# Patient Record
Sex: Female | Born: 1970 | Race: White | Hispanic: No | Marital: Married | Smoking: Never smoker
Health system: Southern US, Community
[De-identification: ages and names within clinical notes are randomized; demographics above are authoritative.]

## PROBLEM LIST (undated history)

## (undated) DIAGNOSIS — F329 Major depressive disorder, single episode, unspecified: Secondary | ICD-10-CM

## (undated) DIAGNOSIS — F419 Anxiety disorder, unspecified: Secondary | ICD-10-CM

## (undated) DIAGNOSIS — D229 Melanocytic nevi, unspecified: Secondary | ICD-10-CM

## (undated) DIAGNOSIS — F32A Depression, unspecified: Secondary | ICD-10-CM

## (undated) DIAGNOSIS — Z87442 Personal history of urinary calculi: Secondary | ICD-10-CM

## (undated) DIAGNOSIS — E785 Hyperlipidemia, unspecified: Secondary | ICD-10-CM

## (undated) HISTORY — DX: Hyperlipidemia, unspecified: E78.5

## (undated) HISTORY — DX: Major depressive disorder, single episode, unspecified: F32.9

## (undated) HISTORY — DX: Depression, unspecified: F32.A

## (undated) HISTORY — PX: APPENDECTOMY: SHX54

## (undated) HISTORY — PX: TUBAL LIGATION: SHX77

---
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
## 1997-09-10 ENCOUNTER — Ambulatory Visit (HOSPITAL_COMMUNITY): Admission: RE | Admit: 1997-09-10 | Discharge: 1997-09-10 | Payer: Self-pay | Admitting: Obstetrics and Gynecology

## 1997-12-05 ENCOUNTER — Inpatient Hospital Stay (HOSPITAL_COMMUNITY): Admission: AD | Admit: 1997-12-05 | Discharge: 1997-12-07 | Payer: Self-pay | Admitting: Obstetrics and Gynecology

## 1999-02-19 ENCOUNTER — Other Ambulatory Visit: Admission: RE | Admit: 1999-02-19 | Discharge: 1999-02-19 | Payer: Self-pay | Admitting: Obstetrics and Gynecology

## 2000-02-24 HISTORY — PX: COLONOSCOPY: SHX174

## 2000-04-06 ENCOUNTER — Other Ambulatory Visit: Admission: RE | Admit: 2000-04-06 | Discharge: 2000-04-06 | Payer: Self-pay | Admitting: Obstetrics and Gynecology

## 2000-10-02 ENCOUNTER — Encounter: Payer: Self-pay | Admitting: Emergency Medicine

## 2000-10-02 ENCOUNTER — Inpatient Hospital Stay (HOSPITAL_COMMUNITY): Admission: EM | Admit: 2000-10-02 | Discharge: 2000-10-04 | Payer: Self-pay | Admitting: Emergency Medicine

## 2000-10-02 ENCOUNTER — Encounter (INDEPENDENT_AMBULATORY_CARE_PROVIDER_SITE_OTHER): Payer: Self-pay | Admitting: Specialist

## 2000-11-10 ENCOUNTER — Emergency Department (HOSPITAL_COMMUNITY): Admission: EM | Admit: 2000-11-10 | Discharge: 2000-11-10 | Payer: Self-pay | Admitting: Emergency Medicine

## 2000-11-13 ENCOUNTER — Emergency Department (HOSPITAL_COMMUNITY): Admission: EM | Admit: 2000-11-13 | Discharge: 2000-11-13 | Payer: Self-pay

## 2000-11-19 ENCOUNTER — Encounter: Admission: RE | Admit: 2000-11-19 | Discharge: 2000-11-19 | Payer: Self-pay | Admitting: Gastroenterology

## 2000-11-19 ENCOUNTER — Encounter: Payer: Self-pay | Admitting: Gastroenterology

## 2000-12-02 ENCOUNTER — Encounter: Payer: Self-pay | Admitting: Obstetrics and Gynecology

## 2000-12-02 ENCOUNTER — Ambulatory Visit (HOSPITAL_COMMUNITY): Admission: RE | Admit: 2000-12-02 | Discharge: 2000-12-02 | Payer: Self-pay | Admitting: Obstetrics and Gynecology

## 2000-12-03 ENCOUNTER — Encounter: Admission: RE | Admit: 2000-12-03 | Discharge: 2000-12-03 | Payer: Self-pay | Admitting: General Surgery

## 2000-12-03 ENCOUNTER — Encounter: Payer: Self-pay | Admitting: General Surgery

## 2001-04-19 ENCOUNTER — Other Ambulatory Visit: Admission: RE | Admit: 2001-04-19 | Discharge: 2001-04-19 | Payer: Self-pay | Admitting: Obstetrics and Gynecology

## 2001-08-03 ENCOUNTER — Encounter: Payer: Self-pay | Admitting: Emergency Medicine

## 2001-08-03 ENCOUNTER — Emergency Department (HOSPITAL_COMMUNITY): Admission: EM | Admit: 2001-08-03 | Discharge: 2001-08-03 | Payer: Self-pay | Admitting: Emergency Medicine

## 2002-05-08 ENCOUNTER — Other Ambulatory Visit: Admission: RE | Admit: 2002-05-08 | Discharge: 2002-05-08 | Payer: Self-pay | Admitting: Obstetrics and Gynecology

## 2002-11-24 ENCOUNTER — Encounter: Payer: Self-pay | Admitting: Gastroenterology

## 2002-11-24 ENCOUNTER — Ambulatory Visit (HOSPITAL_COMMUNITY): Admission: RE | Admit: 2002-11-24 | Discharge: 2002-11-24 | Payer: Self-pay | Admitting: Gastroenterology

## 2003-05-21 ENCOUNTER — Other Ambulatory Visit: Admission: RE | Admit: 2003-05-21 | Discharge: 2003-05-21 | Payer: Self-pay | Admitting: Obstetrics and Gynecology

## 2003-10-05 ENCOUNTER — Encounter: Admission: RE | Admit: 2003-10-05 | Discharge: 2003-10-05 | Payer: Self-pay | Admitting: Internal Medicine

## 2003-10-22 ENCOUNTER — Encounter: Admission: RE | Admit: 2003-10-22 | Discharge: 2003-10-22 | Payer: Self-pay | Admitting: Internal Medicine

## 2004-02-20 ENCOUNTER — Ambulatory Visit: Payer: Self-pay | Admitting: Internal Medicine

## 2004-02-21 ENCOUNTER — Ambulatory Visit: Payer: Self-pay | Admitting: Internal Medicine

## 2004-03-22 ENCOUNTER — Emergency Department (HOSPITAL_COMMUNITY): Admission: EM | Admit: 2004-03-22 | Discharge: 2004-03-22 | Payer: Self-pay | Admitting: Emergency Medicine

## 2004-03-24 ENCOUNTER — Ambulatory Visit: Payer: Self-pay | Admitting: Family Medicine

## 2004-06-04 ENCOUNTER — Other Ambulatory Visit: Admission: RE | Admit: 2004-06-04 | Discharge: 2004-06-04 | Payer: Self-pay | Admitting: Obstetrics and Gynecology

## 2004-06-30 ENCOUNTER — Ambulatory Visit: Payer: Self-pay | Admitting: Family Medicine

## 2004-08-25 ENCOUNTER — Ambulatory Visit: Payer: Self-pay | Admitting: Internal Medicine

## 2004-09-23 ENCOUNTER — Ambulatory Visit: Payer: Self-pay | Admitting: Family Medicine

## 2005-03-11 ENCOUNTER — Ambulatory Visit: Payer: Self-pay | Admitting: Family Medicine

## 2005-03-16 ENCOUNTER — Ambulatory Visit: Payer: Self-pay | Admitting: Family Medicine

## 2005-06-24 ENCOUNTER — Other Ambulatory Visit: Admission: RE | Admit: 2005-06-24 | Discharge: 2005-06-24 | Payer: Self-pay | Admitting: Obstetrics and Gynecology

## 2005-10-13 ENCOUNTER — Inpatient Hospital Stay (HOSPITAL_COMMUNITY): Admission: AD | Admit: 2005-10-13 | Discharge: 2005-10-14 | Payer: Self-pay | Admitting: Obstetrics and Gynecology

## 2005-11-26 ENCOUNTER — Inpatient Hospital Stay (HOSPITAL_COMMUNITY): Admission: AD | Admit: 2005-11-26 | Discharge: 2005-11-26 | Payer: Self-pay | Admitting: Obstetrics and Gynecology

## 2006-01-26 ENCOUNTER — Inpatient Hospital Stay (HOSPITAL_COMMUNITY): Admission: AD | Admit: 2006-01-26 | Discharge: 2006-01-26 | Payer: Self-pay | Admitting: Obstetrics and Gynecology

## 2006-02-16 ENCOUNTER — Ambulatory Visit: Payer: Self-pay | Admitting: Neonatology

## 2006-02-16 ENCOUNTER — Inpatient Hospital Stay (HOSPITAL_COMMUNITY): Admission: AD | Admit: 2006-02-16 | Discharge: 2006-03-11 | Payer: Self-pay | Admitting: Obstetrics and Gynecology

## 2006-02-23 HISTORY — PX: KIDNEY STONE SURGERY: SHX686

## 2006-03-09 ENCOUNTER — Encounter (INDEPENDENT_AMBULATORY_CARE_PROVIDER_SITE_OTHER): Payer: Self-pay | Admitting: *Deleted

## 2006-03-10 ENCOUNTER — Encounter (INDEPENDENT_AMBULATORY_CARE_PROVIDER_SITE_OTHER): Payer: Self-pay | Admitting: *Deleted

## 2006-04-13 ENCOUNTER — Ambulatory Visit: Payer: Self-pay | Admitting: Family Medicine

## 2006-04-19 ENCOUNTER — Ambulatory Visit (HOSPITAL_BASED_OUTPATIENT_CLINIC_OR_DEPARTMENT_OTHER): Admission: RE | Admit: 2006-04-19 | Discharge: 2006-04-19 | Payer: Self-pay | Admitting: Urology

## 2006-04-19 ENCOUNTER — Inpatient Hospital Stay (HOSPITAL_COMMUNITY): Admission: AD | Admit: 2006-04-19 | Discharge: 2006-04-19 | Payer: Self-pay | Admitting: Obstetrics and Gynecology

## 2006-12-13 ENCOUNTER — Telehealth (INDEPENDENT_AMBULATORY_CARE_PROVIDER_SITE_OTHER): Payer: Self-pay | Admitting: *Deleted

## 2007-04-14 ENCOUNTER — Telehealth (INDEPENDENT_AMBULATORY_CARE_PROVIDER_SITE_OTHER): Payer: Self-pay | Admitting: *Deleted

## 2007-04-27 ENCOUNTER — Ambulatory Visit: Payer: Self-pay | Admitting: Family Medicine

## 2007-04-27 DIAGNOSIS — R635 Abnormal weight gain: Secondary | ICD-10-CM | POA: Insufficient documentation

## 2007-04-27 DIAGNOSIS — F329 Major depressive disorder, single episode, unspecified: Secondary | ICD-10-CM | POA: Insufficient documentation

## 2007-04-27 DIAGNOSIS — F3289 Other specified depressive episodes: Secondary | ICD-10-CM | POA: Insufficient documentation

## 2007-04-27 HISTORY — DX: Other specified depressive episodes: F32.89

## 2007-04-27 HISTORY — DX: Abnormal weight gain: R63.5

## 2007-04-27 HISTORY — DX: Major depressive disorder, single episode, unspecified: F32.9

## 2008-06-05 ENCOUNTER — Encounter: Payer: Self-pay | Admitting: Family Medicine

## 2008-06-15 ENCOUNTER — Ambulatory Visit: Payer: Self-pay | Admitting: Family Medicine

## 2009-07-23 ENCOUNTER — Ambulatory Visit: Payer: Self-pay | Admitting: Family Medicine

## 2009-08-29 ENCOUNTER — Ambulatory Visit: Payer: Self-pay | Admitting: Family Medicine

## 2009-08-29 DIAGNOSIS — Z87442 Personal history of urinary calculi: Secondary | ICD-10-CM

## 2009-08-29 HISTORY — DX: Personal history of urinary calculi: Z87.442

## 2009-10-11 ENCOUNTER — Telehealth (INDEPENDENT_AMBULATORY_CARE_PROVIDER_SITE_OTHER): Payer: Self-pay | Admitting: *Deleted

## 2010-03-25 NOTE — Progress Notes (Signed)
Summary: ask pt when last tetnus  ---- Converted from flag ---- ---- 10/11/2009 3:39 PM, Okey Regal Spring wrote: patient refused lab appt   ---- 08/29/2009 3:34 PM, Loreen Freud DO wrote: can we put a note on chart to ask her about her tetanus shot when she comes in for labs? ------------------------------

## 2010-03-25 NOTE — Assessment & Plan Note (Signed)
Summary: cpx/cbs   Vital Signs:  Patient profile:   40 year old female Height:      67 inches Weight:      184 pounds BMI:     28.92 Temp:     97.4 degrees F oral Pulse rate:   80 / minute Resp:     20 per minute BP sitting:   100 / 60  (left arm)  Vitals Entered By: Jeremy Johann CMA (August 29, 2009 2:59 PM) CC: cpx Comments REVIEWED MED LIST, PATIENT AGREED DOSE AND INSTRUCTION CORRECT    History of Present Illness: Pt here for cpe, no pap.  Pt is not fasting.   Pt sees Dr Ambrose Mantle at [facility].   No complaints.  Pt also needs refill.  Preventive Screening-Counseling & Management  Alcohol-Tobacco     Alcohol drinks/day: 0     Smoking Status: never  Caffeine-Diet-Exercise     Caffeine use/day: <1 a day     Does Patient Exercise: yes     Type of exercise: zumba      Exercise (avg: min/session): 30-60     Times/week: <3  Hep-HIV-STD-Contraception     Dental Visit-last 6 months no     Dental Care Counseling: to seek dental care; no dental care within six months     SBE monthly: yes      Sexual History:  currently monogamous.        Drug Use:  no.    Allergies (verified): No Known Drug Allergies  Past History:  Past Medical History: Depression Nephrolithiasis, hx of  Past Surgical History: lithotripsy   Family History: Reviewed history from 06/15/2008 and no changes required. Family History Breast cancer 1st degree relative <50 Family History Hypertension Family History High cholesterol kidney stones  Social History: Reviewed history from 06/15/2008 and no changes required. Married Never Smoked Alcohol use-yes Drug use-no Regular exercise-yes Occupation: owns Hotel manager Caffeine use/day:  <1 a day Dental Care w/in 6 mos.:  no Sexual History:  currently monogamous Drug Use:  no Occupation:  employed  Review of Systems      See HPI General:  Denies chills, fatigue, fever, loss of appetite, malaise, sleep disorder, sweats, weakness, and weight  loss. Eyes:  Denies blurring, discharge, double vision, eye irritation, eye pain, halos, itching, light sensitivity, red eye, vision loss-1 eye, and vision loss-both eyes; optho q1y--+ contacts. ENT:  Denies decreased hearing, difficulty swallowing, ear discharge, earache, hoarseness, nasal congestion, nosebleeds, postnasal drainage, ringing in ears, sinus pressure, and sore throat. CV:  Denies bluish discoloration of lips or nails, chest pain or discomfort, difficulty breathing at night, difficulty breathing while lying down, fainting, fatigue, leg cramps with exertion, lightheadness, near fainting, palpitations, shortness of breath with exertion, swelling of feet, swelling of hands, and weight gain. Resp:  Denies chest discomfort, chest pain with inspiration, cough, coughing up blood, excessive snoring, hypersomnolence, morning headaches, pleuritic, shortness of breath, sputum productive, and wheezing. GI:  Denies abdominal pain, bloody stools, change in bowel habits, constipation, dark tarry stools, diarrhea, excessive appetite, gas, hemorrhoids, indigestion, loss of appetite, nausea, vomiting, vomiting blood, and yellowish skin color. GU:  Denies abnormal vaginal bleeding, decreased libido, discharge, dysuria, genital sores, hematuria, incontinence, nocturia, urinary frequency, and urinary hesitancy. MS:  Denies joint pain, joint redness, joint swelling, loss of strength, low back pain, mid back pain, muscle aches, muscle , cramps, muscle weakness, stiffness, and thoracic pain. Derm:  Denies changes in color of skin, changes in nail beds, dryness,  excessive perspiration, flushing, hair loss, insect bite(s), itching, lesion(s), poor wound healing, and rash. Neuro:  Denies brief paralysis, difficulty with concentration, disturbances in coordination, falling down, headaches, inability to speak, memory loss, numbness, poor balance, seizures, sensation of room spinning, tingling, tremors, visual  disturbances, and weakness. Psych:  Denies alternate hallucination ( auditory/visual), anxiety, depression, easily angered, easily tearful, irritability, mental problems, panic attacks, sense of great danger, suicidal thoughts/plans, thoughts of violence, unusual visions or sounds, and thoughts /plans of harming others. Endo:  Denies cold intolerance, excessive hunger, excessive thirst, excessive urination, heat intolerance, polyuria, and weight change. Heme:  Denies abnormal bruising, bleeding, enlarge lymph nodes, fevers, pallor, and skin discoloration. Allergy:  Denies hives or rash, itching eyes, persistent infections, seasonal allergies, and sneezing.  Physical Exam  General:  Well-developed,well-nourished,in no acute distress; alert,appropriate and cooperative throughout examination Head:  Normocephalic and atraumatic without obvious abnormalities. No apparent alopecia or balding. Eyes:  vision grossly intact, pupils equal, pupils round, pupils reactive to light, and no injection.   Ears:  External ear exam shows no significant lesions or deformities.  Otoscopic examination reveals clear canals, tympanic membranes are intact bilaterally without bulging, retraction, inflammation or discharge. Hearing is grossly normal bilaterally. Nose:  External nasal examination shows no deformity or inflammation. Nasal mucosa are pink and moist without lesions or exudates. Mouth:  Oral mucosa and oropharynx without lesions or exudates.  Teeth in good repair. Neck:  No deformities, masses, or tenderness noted.no carotid bruits.   Breasts:  gyn Lungs:  Normal respiratory effort, chest expands symmetrically. Lungs are clear to auscultation, no crackles or wheezes. Heart:  normal rate and no murmur.   Abdomen:  Bowel sounds positive,abdomen soft and non-tender without masses, organomegaly or hernias noted. Rectal:  gyn Genitalia:  gyn Msk:  normal ROM, no joint tenderness, no joint swelling, no joint warmth,  no redness over joints, no joint deformities, no joint instability, and no crepitation.   Pulses:  R posterior tibial normal, R dorsalis pedis normal, R carotid normal, L posterior tibial normal, L dorsalis pedis normal, and L carotid normal.   Extremities:  No clubbing, cyanosis, edema, or deformity noted with normal full range of motion of all joints.   Neurologic:  No cranial nerve deficits noted. Station and gait are normal. Plantar reflexes are down-going bilaterally. DTRs are symmetrical throughout. Sensory, motor and coordinative functions appear intact. Skin:  Intact without suspicious lesions or rashes Cervical Nodes:  No lymphadenopathy noted Psych:  Oriented X3, normally interactive, good eye contact, not anxious appearing, not depressed appearing, and not suicidal.     Impression & Recommendations:  Problem # 1:  PREVENTIVE HEALTH CARE (ICD-V70.0) check fasting labs  ghm utd   Problem # 2:  DEPRESSION (ICD-311)  Her updated medication list for this problem includes:    Citalopram Hydrobromide 20 Mg Tabs (Citalopram hydrobromide) .Marland Kitchen... 1 by mouth qd  Complete Medication List: 1)  Citalopram Hydrobromide 20 Mg Tabs (Citalopram hydrobromide) .Marland Kitchen.. 1 by mouth qd  Patient Instructions: 1)  fasting labs V70.0  cbcd, bmp, tsh, hep, lipid , UA Prescriptions: CITALOPRAM HYDROBROMIDE 20 MG  TABS (CITALOPRAM HYDROBROMIDE) 1 by mouth qd  #90 Each x 3   Entered and Authorized by:   Loreen Freud DO   Signed by:   Loreen Freud DO on 08/29/2009   Method used:   Electronically to        [facility] [phone]* (retail)       [address]  [place], [place]  [address]       Ph: [phone]       Fax: [phone]   RxID:   [id number]

## 2010-04-02 ENCOUNTER — Encounter: Payer: Self-pay | Admitting: Family Medicine

## 2010-04-16 NOTE — Letter (Signed)
[facility]   Imported By: Maryln Gottron 04/08/2010 10:41:24  _____________________________________________________________________  External Attachment:    Type:   Image     Comment:   External Document

## 2010-07-11 NOTE — Discharge Summary (Signed)
Tina Frost, Tina Frost              ACCOUNT NO.:  [id number]   MEDICAL RECORD NO.:  [id number]          PATIENT TYPE:  INP   LOCATION:  9313                          FACILITY:  [facility]   PHYSICIAN:  Malachi Pro. Ambrose Mantle, M.D. DATE OF BIRTH:  03-26-1970   DATE OF ADMISSION:  02/16/2006  DATE OF DISCHARGE:                               DISCHARGE SUMMARY   A 41 year old white married female para 2-0-0-2, gravida 3 who had  preterm premature rupture of the membranes at 31 weeks while lying in  bed at home with clear fluid.  Blood group and type O negative; negative  antibody; GC and chlamydia negative; RPR nonreactive; rubella immune;  hepatitis B surface antigen negative; HIV declined; amniocentesis 46,XY;  1-hour Glucola 82.  RhoGAM was given on November 26, 2005.  The patient's  past medical history was negative.  Surgical history:  Appendectomy.  She had had two vaginal deliveries, 7 pounds 13 ounces and 8 pounds 1  ounce.  No history of abnormal Paps or STDs.  She was taking prenatal  vitamins.  She had no known drug allergies.  She is married.  Family  history:  Mother has breast cancer.  No history of diabetes or  hypertension.  Father had a stroke.  On admission, her vital signs were  normal, temperature 98.5, blood pressure 139/86, pulse 92, respirations  20.  Head, eyes, ears, nose and throat were normal.  Heart normal size  and sounds, no murmurs.  Lungs clear to auscultation.  Abdomen was soft.  Fetal heart tones were normal.  Sterile speculum exam showed gross  rupture, positive fern, positive Nitrazine.  Vaginal exam:  Cervix  fingertip, 20%, presenting part at a -3.  Ultrasound showed a breech  presentation.  Impressions on admission were intrauterine pregnancy 31  weeks 1 day with preterm premature rupture of the membranes.   The patient was admitted, placed on ampicillin and erythromycin, was  given betamethasone 12 mg IM every 24 hours for two doses.  She received  a NICU  consult.  She was advised that if delivery occurred while the  baby was breech that she would have a C-section.  She was placed at  bedrest and over the next 3 weeks had no significant change in her  status except that the baby did convert to vertex.  Dr. Margot Ables from  [facility] was consulted.  He advised  in delivering the patient at 34 weeks.  The patient reached 34 weeks,  underwent Pitocin induction of labor, and delivered spontaneously OA  over an intact perineum by Dr. Ambrose Mantle a living female infant, 6 pounds 4.7  ounces, with Apgars of 7 at one and 8 at five minutes.  Dr. Joana Reamer was  in attendance at delivery.  The placenta was quite large for 34 weeks.  There was one loop of nuchal cord.  Membrane fragments were removed from  the uterus.  Blood loss was about 400 mL.  The patient requested tubal  sterilization and on the first postoperative day underwent sterilization  by tubal ligation under general anesthesia since the  epidural was not  effective.  The abdominal wall was quite thin.  One could see the bowel  peristalsis beneath the abdominal wall.  Postoperatively, the patient  did well and was discharged on the first postoperative/second postpartum  days.  The patient did receive RhoGAM.   LABORATORY DATA:  Group B strep was negative.  RPR nonreactive.  Initial  hemoglobin 12.9; hematocrit 36.3; white count 9600; platelet count  231,000.  Three other CBCs were done during the hospitalization  including the first postoperative day and there was no significant  change.  Fetal lung maturity test done on admission was 6.0.  Ultrasound  done on February 17, 2006, showed a single intrauterine gestation in the  frank breech presentation, by ultrasound was 32 weeks 1 day, decreased  amniotic fluid compatible with preterm premature rupture of the  membranes.  Followup ultrasound on March 02, 2006, showed a suspected  estimated fetal weight of 5 pounds  7 ounces with decreased amniotic  fluid in vertex presentation.   FINAL DIAGNOSES:  1. Intrauterine pregnancy at 34 weeks, delivered vertex after preterm      premature rupture of membranes at 31 weeks.  2. Voluntary sterilization.   OPERATION:  1. Spontaneous delivery OA.  2. Bilateral tubal ligation.   FINAL CONDITION:  Improved.   INSTRUCTIONS:  Include our regular discharge instruction booklet.  Percocet 5/325 #30 tablets one every 4-6 hours as needed for pain.  The  baby was taken to the NICU after delivery and was placed on CPAP, was  never intubated, and the CPAP was discontinued on the first postpartum  day.  The patient is advised to return in 2 weeks.      Malachi Pro. Ambrose Mantle, M.D.  Electronically Signed     TFH/MEDQ  D:  03/11/2006  T:  03/11/2006  Job:  [id number]

## 2010-07-11 NOTE — Op Note (Signed)
[facility]  Patient:    Tina Frost, Tina Frost                       MRN: [id number] Proc. Date: 10/03/00 Attending:  Sharlet Salina T. Hoxworth, M.D.                           Operative Report  PREOPERATIVE DIAGNOSIS:  Acute appendicitis.  POSTOPERATIVE DIAGNOSIS:  Acute appendicitis.  SURGICAL PROCEDURE:  Laparoscopic appendectomy.  SURGEON:  Lorne Skeens. Hoxworth, M.D.  ANESTHESIA:  General.  BRIEF HISTORY:  [name] is a 40 year old female, who presents with signs and symptoms consistent with acute appendicitis and had a CT scan obtained in the emergency room confirming this diagnosis.  Laparoscopic appendectomy has been recommended and accepted.  The nature of the procedure, its indications, the risks of bleeding and infection were discussed and understood preoperatively.  She is now brought to the operating room for this procedure.  DESCRIPTION OF OPERATION:  The patient was brought to the operating room and placed in the supine position on the operating table, and general anesthesia was induced.  She received preoperative antibiotics.  The abdomen was sterilely prepped and draped.  Foley catheter and orogastric tube were in place.  Local anesthesia was used to infiltrate the trocar sites prior to the incisions.  A 1 cm incision was made at the umbilicus and dissection carried down to the midline fascia.  This was sharply incised for 1 cm and the peritoneum entered under direct vision.  Through a mattress suture of 0 Vicryl, the Hasson trocar was placed and pneumoperitoneum established.  Under direct vision, the 5 mm trocar was placed in the right upper quadrant and a 12 mm trocar in the left lower quadrant.  The appendix was easily visualized and was acutely inflamed without gangrene or perforation.  The appendix was mobilized from lateral peritoneal attachments.  The base of the appendix was separated from the mesoappendix with blunt dissection,  and the appendix was divided at its base with the firing of the 3.5 mm Endo GIA stapler.  The mesoappendix was rather broad and was divided with two firings of the vascular stapler.  The staples were intact and without bleeding.  The right lower quadrant and pelvis were thoroughly irrigated and then suctioned.  The appendix was placed in the EndoCatch bag and brought out through the umbilicus.  Trocars were removed under direct vision and all CO2 evacuated from the peritoneal cavity.  The pursestring suture was secured at the umbilicus.  The skin incisions were closed with interrupted subcuticular and 4-0 Monocryl and Steri-Strips.  Sponge, needle, and instrument counts were correct.  Dry sterile dressing was applied and the patient taken to recovery in good condition. DD:  10/03/00 TD:  10/03/00 Job: [id number]

## 2010-07-11 NOTE — Consult Note (Signed)
[place]  Patient:    Tina Frost, Tina Frost Visit Number: [id number] MRN: [id number]          Service Type: EMS Location: ED Attending Physician:  Pearletha Alfred Dictated by:   Currie Paris, M.D. Admit Date:  11/13/2000                            Consultation Report  ACCOUNT NUMBER:  [id number].  CHIEF COMPLAINT:  Abdominal pain.  HISTORY OF PRESENT ILLNESS:  Tina Frost is a 40 year old woman who underwent a laparoscopic appendectomy about four weeks ago by Sharlet Salina T. Hoxworth, M.D. This was done for acute appendicitis diagnosed by CT and confirmed pathologically.  She had initially had an unremarkable postop course.  She began developing some periumbilical burning pain that was fairly severe associated with some nausea and was seen in the ED by Dr. Ezzard Standing on Wednesday, (today is Saturday) with a workup including some labs and an exam which was basically unremarkable.  She was questionably a little bit better after being given some Pepcid.  She had a little bit of pain over the next couple of days and then this morning had much more severe burning pain that she described as a 7 on a 1/10 scale.  She spoke to me earlier today and came to the emergency room for [place] to take another look at.  She had not had any fever at home.  Her bowels have been working okay.  She had not noticed any redness or changes around her incisions, and she had not had any distention or bloating.  By the time she got here, her pain had started to ease off.  She has not been using anything for pain medication.  She is concerned that she might have an ulcer or some other medical problem.  PAST MEDICAL HISTORY:  Unremarkable, noted in the old notes and not redictated here.  PHYSICAL EXAMINATION:  VITAL SIGNS:  Temperature 99.2, respirations 18, pulse 84, blood pressure 112/77.  GENERAL:  The patient is alert, awake, and currently in no distress.  HEENT:  Head  normocephalic.  Eyes are anicteric pupils round and regular.  NECK:  Supple.  No masses or thyromegaly.  CHEST:  Lungs are clear to auscultation.  CARDIAC:  The heart is regular.  No murmurs, rubs, or gallops.  ABDOMEN:  Soft.  She has some thickening at the umbilicus, which I think is just her suture material.  I do not see an abscess or other problem here.  It is not hot.  The abdomen is soft and otherwise completely nontender even to deep palpation.  There is no right upper quadrant tenderness at all.  EXTREMITIES:  Unremarkable.  LABORATORY DATA:  Laboratory studies are pending.  IMPRESSION:  Intermittent and ongoing periumbilical burning pain of uncertain etiology.  PLAN:  Pending her white count, if that is normal we will see if we can get her with a GI follow-up and some pain medication to tide her over.  She is followed by Dr. Alwyn Ren, and will see if we can get the [facility] folks to see her. Dictated by:   Currie Paris, M.D. Attending Physician:  Susy Manor B DD:  11/13/00 TD:  11/13/00 Job: [id number]

## 2010-07-11 NOTE — Op Note (Signed)
Tina Frost, Tina Frost              ACCOUNT NO.:  [id number]   MEDICAL RECORD NO.:  [id number]         PATIENT TYPE:  HAMB   LOCATION:                               FACILITY:  [facility]   PHYSICIAN:  Excell Seltzer. Annabell Howells, M.D.    DATE OF BIRTH:  1970/09/21   DATE OF PROCEDURE:  04/19/2006  DATE OF DISCHARGE:                               OPERATIVE REPORT   PROCEDURE:  1. Cystoscopy.  2. Left ureteroscopic stone extraction with holmium laser tripsy.  3. Insertion of left double-J stent.   PREOPERATIVE DIAGNOSIS:  Left distal ureteral stone.   POSTOPERATIVE DIAGNOSIS:  Left distal ureteral stone.   SURGEON:  Excell Seltzer. Annabell Howells, M.D.   ANESTHESIA:  General.   SPECIMEN:  Stone fragment.   DRAINS:  A 6 x 26 cm double-J stent.   COMPLICATIONS:  None.   INDICATIONS:  [name] is a 40 year old white female who had the onset,  this morning, of severe left flank pain.  She had a history of stones  and a KUB in the office revealed an approximately 5 x 7 mm left distal  ureteral stone.  After discussing the options of observation,  ureteroscopy, and lithotripsy; she elected ureteroscopy.   FINDINGS AND PROCEDURE:  The patient was given Cipro.  She was taken to  the operating room where general anesthetic was induced.  She was placed  in the lithotomy position.  Her perineum and genitalia were prepped with  Betadine solution.  She was draped in the usual sterile fashion.  Cystoscopy was performed using a 22-French scope and 12-and-70 degree  lenses.  Examination revealed a normal urethra.  The bladder wall was  smooth and pale without tumor, stones, or inflammation.  The ureteral  orifices were unremarkable.   The left ureteral orifice was cannulated with a standard guidewire.  This was advanced to the stone, but could not be passed by the stone.  An open-end catheter was then advanced to the stone followed by a  Glidewire.  I was unable to advance the open-end catheter over the glide  wire  completely by the stone, but felt it was sufficiently by the stone  to exchange the Glidewire for the standard wire which was done  successfully.  The open-end catheter was then removed.  I then passed a  12-French inner core from an  access sheath kit to the level of the  stone.  This was removed and a 6-French short ureteroscope was then  advanced to the stone which was readily visible in the ureter.   The stone was then engaged with the Holmium laser at 0.365 fiber at 0.5  watts and 5 Hz.  The stone fragmented readily into manageable pieces.  The pieces were then extracted using a nitinol basket.  Final inspection  revealed no retained fragments.  At this point a 6-French, 26-cm, double-  J stent was inserted without difficulty under fluoroscopic guidance.  The wire was removed leaving good coil in the kidney and good coil in  the bladder.  The bladder was drained.  The cystoscope was removed  leaving the stent string  exiting the urethra.  This was tied close to  the urethra and trimmed.  The patient was taken down from lithotomy  position.  Her anesthetic was reversed; and she was admitted to the  recovery room in stable condition.  There were no complications.  The  stone fragments were given to the family.      Excell Seltzer. Annabell Howells, M.D.  Electronically Signed     JJW/MEDQ  D:  04/19/2006  T:  04/19/2006  Job:  [id number]   cc:   Lelon Perla, DO  [address] [place], [place] [address]

## 2010-07-11 NOTE — Consult Note (Signed)
[place]. [facility]  Patient:    Tina Frost, Tina Frost Visit Number: [id number] MRN: [id number]          Service Type: EMS Location: ED Attending Physician:  Devoria Albe Dictated by:   Sandria Bales. Ezzard Standing, M.D. Proc. Date: 11/10/00 Admit Date:  11/10/2000   CC:         [name], M.D. [facility]  [id number]   Consultation Report  DATE OF BIRTH:  1970/11/24  PREOPERATIVE DIAGNOSIS:  POSTOPERATIVE DIAGNOSIS:  OPERATION PERFORMED:  SURGEON:  Sandria Bales. Ezzard Standing, M.D.  ASSISTANT:  ANESTHESIA:  INDICATIONS FOR PROCEDURE:  This is a 40 year old white female ____________ her husband to the emergency room.  She had a laparoscopic appendectomy by Dr. Jaclynn Guarneri approximately five weeks ago.  I do not have any of that chart at present.  She had an uneventful postoperative course.  She works at [employer] working with attendance, had returned to work, had not been doing much lifting but does have a child at home.  Apparently about 5 p.m. this evening she had some worsening periumbilical pain and burning, had some mild nausea and presented to the emergency room.  She was given some Pepcid which had not helped much but she was given some Nubain which seemed to help quite a bit with her discomfort.  She denies any history of peptic ulcer disease, liver disease, pancreatic disease.  Again she had the appendectomy about 5 weeks ago.  PAST MEDICAL HISTORY:  She is allergic to SULFA.  REVIEW OF SYSTEMS:  Otherwise noncontributory except she does have a history of kidney stones.  PHYSICAL EXAMINATION:  On physical exam her temperature is 99.5, pulse 96, respiratory rate 24, blood pressure 121/80.  She is a well-nourished white female alert and cooperative on physical exam.  Her lungs were clear to auscultation.  Heart has a regular rate and rhythm without murmur or rub.  Her abdomen is soft.  She has three incisions which were all healed from  her laparoscopic appendectomy.  She has no redness, no swelling, no evidence of any infection.  Bowel sounds were present but no guarding no rebound.  She is a little sleepy from the medicine she has been given.  LABORATORY DATA:  Her sodium was 137, potassium 3.6, chloride 109.  Creatinine 0.6, her alkaline phosphatase was 66, total bilirubin 0.7.  Her amylase was 61.  Her urinalysis was unremarkable.  Her white blood cell count was 8600. Her hemoglobin was 14.4, hematocrit 40.5, platelet count 252,000.  Her lipase was 30.  My impression is she has some irritation of her umbilical incision which is still a little hard, but she has no evidence of infection, no evidence of acute trauma and I think her discomfort will resolve over the next one to three or four days.  I suggested she take some anti-inflammatory drugs such as ibuprofen or Aleve.  If this does not take care of it, she has some pain medicine Dr. Johna Sheriff sent her home said she thinks she is on Vicodin.  She can also try.  If she is no better in two to four days she can call our office or been in our urgent office.  I want her to make an appointment regardless with Dr. Johna Sheriff in two weeks.  She has her husband at her side who understood these instructions.  She is to call for any worsening symptoms such as fever, nausea or vomiting. Dictated by:   Sandria Bales.  Ezzard Standing, M.D. Attending Physician:  Devoria Albe DD:  11/10/00 TD:  11/11/00 Job: [id number]

## 2010-07-11 NOTE — Op Note (Signed)
Tina Frost, Tina Frost              ACCOUNT NO.:  [id number]   MEDICAL RECORD NO.:  [id number]          PATIENT TYPE:  INP   LOCATION:  9313                          FACILITY:  [facility]   PHYSICIAN:  Malachi Pro. Ambrose Mantle, M.D. DATE OF BIRTH:  June 04, 1970   DATE OF PROCEDURE:  03/10/2006  DATE OF DISCHARGE:                               OPERATIVE REPORT   PREOPERATIVE DIAGNOSIS:  Voluntary sterilization.   POSTOPERATIVE DIAGNOSIS:  Voluntary sterilization.   OPERATION:  Bilateral tubal ligation.   OPERATOR:  Malachi Pro. Ambrose Mantle, M.D.   General anesthesia.   The patient brought to the operating room.  After extensive counseling  regarding the pros and cons of tubal ligation, she requested to proceed.  The epidural anesthetic was boosted but the patient did not get any  relief.  In fact, she stated that the epidural did not give her relief  during labor, so she was placed under satisfactory general anesthesia,  tracheal intubation was done.  The abdomen was prepped with Betadine  solution.  You could tell that it was a very then abdominal wall.  You  could see the bowel peristalsing underneath the umbilicus.  The urethra  was prepped and the bladder was emptied with a [place] catheter.  The  patient was placed supine.  The abdomen was draped as a sterile field  and the skin over the inferior portion of the umbilicus was elevated and  an incision was made superficially in the skin to protect the bowel.  There was very little abdominal wall.  I was very careful to avoid  injury to the bowel as I entered the abdominal cavity.  I visualized the  right tube, traced it to its fimbriated end, made an incision in the  mesosalpinx with the Bovie, and placed two ties of 0 plain catgut  proximally and distally and cut out the intervening tube.  There was no  bleeding.  The left tube was handled in exactly the same way.  I did not  see either ovary.  The abdominal wall was then closed with a running  suture  of 0 Vicryl in the fascia and the peritoneum from the right side  to the left side.  After I had closed this, I realized there was a small  buttonhole in the skin on the left side of the incision.  I dissected  the abdominal wall skin away from the buttonhole, do not think I injured  the suture  It left a quarter-inch inch defect in the skin that I  reapproximated with 3-0 plain catgut and I closed the skin edges with 3-  0 plain catgut.  The patient seemed to tolerate the procedure well.  Blood loss was less than 5 mL.  Sponge and needle counts were correct  and she was returned to recovery in satisfactory condition.      Malachi Pro. Ambrose Mantle, M.D.  Electronically Signed     TFH/MEDQ  D:  03/10/2006  T:  03/10/2006  Job:  [id number]

## 2010-10-16 ENCOUNTER — Encounter (INDEPENDENT_AMBULATORY_CARE_PROVIDER_SITE_OTHER): Payer: Self-pay | Admitting: General Surgery

## 2010-10-17 ENCOUNTER — Encounter (INDEPENDENT_AMBULATORY_CARE_PROVIDER_SITE_OTHER): Payer: Self-pay | Admitting: General Surgery

## 2010-10-17 ENCOUNTER — Ambulatory Visit (INDEPENDENT_AMBULATORY_CARE_PROVIDER_SITE_OTHER): Payer: BC Managed Care – PPO | Admitting: General Surgery

## 2010-10-17 VITALS — BP 152/109 | HR 72 | Temp 97.4°F | Ht 67.0 in | Wt 188.4 lb

## 2010-10-17 DIAGNOSIS — E669 Obesity, unspecified: Secondary | ICD-10-CM

## 2010-10-17 DIAGNOSIS — M6200 Separation of muscle (nontraumatic), unspecified site: Secondary | ICD-10-CM

## 2010-10-17 DIAGNOSIS — K429 Umbilical hernia without obstruction or gangrene: Secondary | ICD-10-CM

## 2010-10-17 DIAGNOSIS — M6208 Separation of muscle (nontraumatic), other site: Secondary | ICD-10-CM

## 2010-10-17 NOTE — Patient Instructions (Signed)
I would encourage you try to get a white reduction plan increase exercise plan. The small defect at the navel reduces easily or spontaneously at this time. If the area does not spontaneously reduced then you definitely need to return to see [place] soon the generalized weakness or diastases more complex problem that would require plastic surgery correction and probably only mesh. I would recommend that you consult with plastic surgery if this is desired. I would recommend trying to lose probably 20 # if possible. After you have lost 20 pounds in your comfortable I would then just repair the small umbilical hernia.

## 2010-10-17 NOTE — Progress Notes (Signed)
Subjective:     Patient ID: Tina Frost, female   DOB: April 22, 1970, 40 y.o.   MRN: [id number]  HPI The patient is a mother of 3 who was referred by Dr. Rana Snare  from [facility] for evaluation of a small umbilical hernia the patient after her last pregnancy had a tubal ligation through the umbilicus and states that she's had a small defect that occurred later our records show that when she was 30 she had a laparoscopic appendectomy by Dr. Johna Sheriff and had multiple problems with postoperative pain but I don't think any followed problem was noted I did not have any reports from the tubal ligation which was the more recent procedure. She is accompanied by her older daughter and all first question she stated that her physicians had noted that she has a very lax abdominal wall and then a small umbilical hernia and whether this could be surgically corrected. Low hernia is about a dime size reduces spontaneous but the diastases as much larger area but I cannot appreciate any definite midline ventral hernias she weighs approximately 180 pounds and states her normal weight used to be approximately 150 at first she was, in her surgery could this be corrected so she was flat and then after a long discussion about plastic surgery tummy tuck and etc. he cannot changed her mind and was not interested in proceeding in that direction and was asking could or should she have anything done about the small umbilical defect. The umbilical defect is very small reduces spontaneously but when she strains it does protrude approximately 3/4 of the inch.   Review of Systems  Current Outpatient Prescriptions  Medication Sig Dispense Refill  . CITALOPRAM HYDROBROMIDE PO Take by mouth daily.         No Known Allergies Past Surgical History  Procedure Date  . Colonoscopy 2002  . Appendectomy   . Tubal ligation   . Kidney stone surgery 2008        Objective:   Physical Exam BP 152/109  Pulse 72  Temp(Src) 97.4 F  (36.3 C) (Temporal)  Ht 5\' 7"  (1.702 m)  Wt 188 lb 6.4 oz (85.458 kg)  BMI 29.51 kg/m2 The patient is a pleasant middle-aged female in no acute distress. There is no abdominal tenderness of abdominal masses she does have a small defect at the navel proximally 1 cm in size that protrudes when she strains but on times he reduces she has multiple stretch marks and weakness of the abdominal rectus muscles but not really a true ventral hernia that I can appreciate after discussion with her about trying to reduce her weight through exercise and diet and possible tummy tuck by plastic surgery and she changed and decided she did not want to proceed that way out recommended we try to decrease her weight she is presently 180 and says 150 as where she used to be and benefits the hernia is symptomatic probably repair the umbilical hernia will hold with a mesh. I would not encourage the use of a large piece of intra-abdominal mesh laparoscopically to try to correct the diastases.     Assessment:    Encouraged that area and physical exercises for weight management and followup exam in 2 months    Plan:     See note above

## 2010-10-30 ENCOUNTER — Other Ambulatory Visit: Payer: Self-pay | Admitting: Family Medicine

## 2010-10-30 MED ORDER — CITALOPRAM HYDROBROMIDE 20 MG PO TABS: 20.0000 mg | ORAL_TABLET | Freq: Every day | ORAL | Status: DC

## 2010-10-30 NOTE — Telephone Encounter (Signed)
Last seen and filled 08/29/09 # 90 with 3 refills. Please advise     KP

## 2010-10-30 NOTE — Telephone Encounter (Signed)
Refill x1 only--  Make ov first and then refill

## 2010-11-14 ENCOUNTER — Ambulatory Visit: Payer: BC Managed Care – PPO | Admitting: Family Medicine

## 2010-11-28 ENCOUNTER — Encounter: Payer: Self-pay | Admitting: Family Medicine

## 2010-11-28 ENCOUNTER — Ambulatory Visit (INDEPENDENT_AMBULATORY_CARE_PROVIDER_SITE_OTHER): Payer: BC Managed Care – PPO | Admitting: Family Medicine

## 2010-11-28 VITALS — BP 134/90 | HR 64 | Temp 97.9°F | Wt 186.6 lb

## 2010-11-28 DIAGNOSIS — F411 Generalized anxiety disorder: Secondary | ICD-10-CM

## 2010-11-28 DIAGNOSIS — R002 Palpitations: Secondary | ICD-10-CM

## 2010-11-28 DIAGNOSIS — F419 Anxiety disorder, unspecified: Secondary | ICD-10-CM

## 2010-11-28 LAB — BASIC METABOLIC PANEL
CO2: 27 mEq/L (ref 19–32)
Calcium: 9.1 mg/dL (ref 8.4–10.5)
GFR: 116.32 mL/min (ref >60.00)
Sodium: 138 mEq/L (ref 135–145)

## 2010-11-28 LAB — CBC WITH DIFFERENTIAL/PLATELET
Basophils Relative: 1 % (ref 0.0–3.0)
Eosinophils Relative: 0.9 % (ref 0.0–5.0)
Hemoglobin: 12.4 g/dL (ref 12.0–15.0)
Lymphocytes Relative: 34.4 % (ref 12.0–46.0)
Monocytes Relative: 7.8 % (ref 3.0–12.0)
Neutro Abs: 3.8 10*3/uL (ref 1.4–7.7)
RBC: 4.72 Mil/uL (ref 3.87–5.11)
WBC: 6.7 10*3/uL (ref 4.5–10.5)

## 2010-11-28 LAB — HEPATIC FUNCTION PANEL
AST: 19 U/L (ref 0–37)
Albumin: 4.3 g/dL (ref 3.5–5.2)
Alkaline Phosphatase: 74 U/L (ref 39–117)
Total Protein: 7.7 g/dL (ref 6.0–8.3)

## 2010-11-28 MED ORDER — ALPRAZOLAM 0.25 MG PO TABS: 0.2500 mg | ORAL_TABLET | Freq: Three times a day (TID) | ORAL | Status: DC | PRN

## 2010-11-28 NOTE — Patient Instructions (Signed)
Palpitations (Irregular Heartbeat) A palpitation is the feeling that your heartbeat is irregular or is faster than normal. Although this is frightening, it usually is not serious. Palpitations may be caused by excesses of smoking, caffeine, or alcohol. They are also brought on by stress and anxiety. Sometimes, they are caused by heart disease. Unless otherwise noted, your caregiver did not find any signs of serious illness at this time. HOME CARE INSTRUCTIONS To help prevent palpitations:  Drink decaffeinated coffee, tea, and soda pop. Avoid chocolate.   If you smoke or drink alcohol, quit or cut down as much as possible.   Reduce your stress or anxiety level. Biofeedback, yoga, or meditation will help you relax. Physical activity such as swimming, jogging, or walking also may be helpful.  SEEK MEDICAL CARE IF:  You continue to have a fast heartbeat.   Your palpitations occur more often.  SEEK IMMEDIATE MEDICAL CARE IF : You develop chest pain, shortness of breath, severe headache, dizziness, or fainting. Document Released: 02/07/2000 Document Re-Released: 03/03/2009 Exit[employer] Patient Information 2011 [employer]. 

## 2010-11-28 NOTE — Progress Notes (Signed)
  Subjective:    Patient ID: Tina Frost, female    DOB: 06-29-1970, 40 y.o.   MRN: [id number]  HPI Pt here c/o about not being able to catch her breath for the last 6 months and palpitations with a heaviness on chest.  It occurs several times a week.  Pt does admit to being under more stress lately.    Review of Systems As above    Objective:   Physical Exam  Constitutional: She is oriented to person, place, and time. She appears well-developed and well-nourished.  Cardiovascular: Normal rate, regular rhythm and normal heart sounds.  Exam reveals no gallop and no friction rub.   No murmur heard. Pulmonary/Chest: Effort normal and breath sounds normal. No respiratory distress. She has no wheezes. She has no rales. She exhibits no tenderness.  Abdominal: Soft. She exhibits no distension. There is no tenderness. There is no rebound and no guarding.  Musculoskeletal: She exhibits no edema.  Neurological: She is alert and oriented to person, place, and time.  Psychiatric: She has a normal mood and affect. Her behavior is normal. Judgment and thought content normal.          Assessment & Plan:  1 palpitations---- ? Etiology anxiety vs cardiac      Check labs inc thyroid       Echo and event monitor ordered        con't celexa and add xanax .25 mg tid prn            rto 2 weeks or prn

## 2010-12-02 ENCOUNTER — Telehealth: Payer: Self-pay | Admitting: Family Medicine

## 2010-12-02 NOTE — Telephone Encounter (Signed)
No, not scheduled.  [employer] requires notes to be faxed for review, they do not pre-cert by phone.  I'm waiting on decision, 72 hour turn-around.

## 2010-12-02 NOTE — Telephone Encounter (Signed)
Was event monitor scheduled---we can wait for that

## 2010-12-02 NOTE — Telephone Encounter (Signed)
In reference to Order for Echo, [employer] did not approve by phone, did not meet criteria.  For approval, Peer to Peer is required by calling, ph [phone], Opt 2, reference the patient's ID #[id number], her dob, and her name.

## 2010-12-02 NOTE — Telephone Encounter (Signed)
If that is denied too we will refer to cardiology

## 2010-12-03 NOTE — Telephone Encounter (Signed)
Please let me know if this is not approved so I an put in a referral for Cardiology. Thank you

## 2010-12-04 NOTE — Telephone Encounter (Signed)
Call from [facility] and patient's Echo has been denied because the clinical information does not support the request. If you would like to speak with a [employer] physician and  Appeal call [phone] option 2

## 2010-12-04 NOTE — Telephone Encounter (Signed)
[employer] has denied the Event Monitor, and will have a decision on the Echo by end of business day today.

## 2010-12-04 NOTE — Telephone Encounter (Signed)
msg left to call the office     KP 

## 2010-12-04 NOTE — Telephone Encounter (Signed)
Notify pt that her insurance will not cover test--  She should be coming in in a week or 2---we will discuss further then.

## 2010-12-05 NOTE — Telephone Encounter (Signed)
i wanted to see her to make sure she was doing better---- if she is still symptomatic we can get her in to see cardio

## 2010-12-05 NOTE — Telephone Encounter (Signed)
Left message to call office

## 2010-12-05 NOTE — Telephone Encounter (Signed)
Spoke with Luster Landsberg, Dr. Ernst Spell office, [id number] for 2D echo on 12-10-10.  Per Luster Landsberg, insurance denied echo and monitor.  Per Luster Landsberg, please cancel echo.  Pt has appointment 12-11-10 to discuss what to do next.  Spoke with pt, she was unaware echo was cancelled.  (Dr. Ernst Spell office had left a message for pt to call back).  Informed her insurance denied echo and per Dr. Laury Axon, she would discuss at 12-11-10.  Advised pt if any further question, please contact Dr. Ernst Spell office.  Pt understands and agrees.

## 2010-12-05 NOTE — Telephone Encounter (Signed)
Patient wants to know if she should keep appt [phone] with dr Laury Axon she said she doesn't want to pay 35.00 copay if she doesn't need to - if she is going to be told tests cant be done

## 2010-12-05 NOTE — Telephone Encounter (Signed)
Sometimes with Epic, facilities are pulling patients from the workque and contacting them for appointments, and I haven't had time to pre-cert yet.  In this case, I wasn't aware the patient had already been scheduled for an Echo.

## 2010-12-09 NOTE — Telephone Encounter (Signed)
Detailed VM left advising that Dr. Laury Axon would like to see her and to call back if there were any concerns    KP

## 2010-12-10 ENCOUNTER — Other Ambulatory Visit (HOSPITAL_COMMUNITY): Payer: BC Managed Care – PPO | Admitting: Radiology

## 2010-12-11 ENCOUNTER — Ambulatory Visit: Payer: BC Managed Care – PPO | Admitting: Family Medicine

## 2010-12-11 DIAGNOSIS — Z0289 Encounter for other administrative examinations: Secondary | ICD-10-CM

## 2010-12-15 ENCOUNTER — Encounter: Payer: Self-pay | Admitting: *Deleted

## 2010-12-30 ENCOUNTER — Ambulatory Visit: Payer: BC Managed Care – PPO | Admitting: Family Medicine

## 2010-12-30 DIAGNOSIS — Z0289 Encounter for other administrative examinations: Secondary | ICD-10-CM

## 2011-02-19 ENCOUNTER — Other Ambulatory Visit: Payer: Self-pay | Admitting: Family Medicine

## 2011-02-19 MED ORDER — CITALOPRAM HYDROBROMIDE 20 MG PO TABS: 20.0000 mg | ORAL_TABLET | Freq: Every day | ORAL | Status: DC

## 2011-02-19 NOTE — Telephone Encounter (Signed)
RX SENT

## 2011-06-15 ENCOUNTER — Telehealth: Payer: Self-pay | Admitting: Family Medicine

## 2011-06-15 MED ORDER — CITALOPRAM HYDROBROMIDE 20 MG PO TABS: 20.0000 mg | ORAL_TABLET | Freq: Every day | ORAL | Status: DC

## 2011-06-15 NOTE — Telephone Encounter (Signed)
Refill: Citalopram hbr 20mg  tab #30. Take one tablet by mouth every day. Last fill 05-01-11

## 2011-08-05 ENCOUNTER — Other Ambulatory Visit: Payer: Self-pay | Admitting: Family Medicine

## 2011-08-05 NOTE — Telephone Encounter (Signed)
Refill Citalopram HCR 20MG  TAB  Qty 30 Take one tablet by mouth daily  Last fill 4.22.13 *NOTE this refill stated OV DUE NOW, no current appointment on file  Last ov 10.5.12

## 2011-08-06 MED ORDER — CITALOPRAM HYDROBROMIDE 20 MG PO TABS: 20.0000 mg | ORAL_TABLET | Freq: Every day | ORAL | Status: DC

## 2011-08-06 NOTE — Telephone Encounter (Signed)
1 month only--- needs ov

## 2011-08-06 NOTE — Telephone Encounter (Signed)
Please advise      KP 

## 2011-08-28 ENCOUNTER — Telehealth: Payer: Self-pay | Admitting: Family Medicine

## 2011-08-28 MED ORDER — CITALOPRAM HYDROBROMIDE 20 MG PO TABS: 20.0000 mg | ORAL_TABLET | Freq: Every day | ORAL | Status: DC

## 2011-08-28 NOTE — Telephone Encounter (Signed)
Pt would like refill of Amitriptyline. I do not see it on her med list. She uses [facility].

## 2011-08-28 NOTE — Telephone Encounter (Signed)
Okay #30, no refills. Needs office visit with Dr. Laury Axon

## 2011-08-28 NOTE — Telephone Encounter (Signed)
Spoke with pt & she needs citalopram 20mg  #30 with zero refills. Last filled on 6.13.13. OK to refill?

## 2011-08-28 NOTE — Addendum Note (Signed)
Addended by: Edwena Felty T on: 08/28/2011 03:35 PM   Modules accepted: Orders

## 2011-08-28 NOTE — Telephone Encounter (Signed)
Called pt to clarify med refill request. Left msg to call the office.

## 2011-08-28 NOTE — Telephone Encounter (Signed)
Refill done.  

## 2011-10-12 ENCOUNTER — Encounter: Payer: Self-pay | Admitting: Family Medicine

## 2011-10-12 ENCOUNTER — Ambulatory Visit (INDEPENDENT_AMBULATORY_CARE_PROVIDER_SITE_OTHER): Payer: BC Managed Care – PPO | Admitting: Family Medicine

## 2011-10-12 VITALS — BP 126/82 | HR 84 | Temp 98.5°F | Ht 65.75 in | Wt 186.8 lb

## 2011-10-12 DIAGNOSIS — F3289 Other specified depressive episodes: Secondary | ICD-10-CM

## 2011-10-12 DIAGNOSIS — F329 Major depressive disorder, single episode, unspecified: Secondary | ICD-10-CM

## 2011-10-12 DIAGNOSIS — Z0000 Encounter for general adult medical examination without abnormal findings: Secondary | ICD-10-CM

## 2011-10-12 DIAGNOSIS — Z23 Encounter for immunization: Secondary | ICD-10-CM

## 2011-10-12 LAB — POCT URINALYSIS DIPSTICK
Bilirubin, UA: NEGATIVE
Ketones, UA: NEGATIVE
Leukocytes, UA: NEGATIVE

## 2011-10-12 LAB — CBC WITH DIFFERENTIAL/PLATELET
Eosinophils Absolute: 0 10*3/uL (ref 0.0–0.7)
Eosinophils Relative: 0.9 % (ref 0.0–5.0)
Lymphs Abs: 1.5 10*3/uL (ref 0.7–4.0)
MCV: 74.3 fl — ABNORMAL LOW (ref 78.0–100.0)
Monocytes Relative: 8.9 % (ref 3.0–12.0)
Neutrophils Relative %: 55.5 % (ref 43.0–77.0)
Platelets: 251 10*3/uL (ref 150.0–400.0)
RDW: 17.4 % — ABNORMAL HIGH (ref 11.5–14.6)
WBC: 4.5 10*3/uL (ref 4.5–10.5)

## 2011-10-12 LAB — HEPATIC FUNCTION PANEL
AST: 33 U/L (ref 0–37)
Albumin: 4 g/dL (ref 3.5–5.2)
Alkaline Phosphatase: 60 U/L (ref 39–117)
Total Protein: 7.2 g/dL (ref 6.0–8.3)

## 2011-10-12 LAB — LIPID PANEL: HDL: 57.9 mg/dL (ref >39.00)

## 2011-10-12 LAB — TSH: TSH: 0.37 u[IU]/mL (ref 0.35–5.50)

## 2011-10-12 LAB — BASIC METABOLIC PANEL
BUN: 10 mg/dL (ref 6–23)
Creatinine, Ser: 0.6 mg/dL (ref 0.4–1.2)
GFR: 122.73 mL/min (ref >60.00)
Glucose, Bld: 80 mg/dL (ref 70–99)

## 2011-10-12 MED ORDER — CITALOPRAM HYDROBROMIDE 20 MG PO TABS: 20.0000 mg | ORAL_TABLET | Freq: Every day | ORAL | Status: DC

## 2011-10-12 NOTE — Progress Notes (Signed)
  Subjective:     Tina Frost is a 41 y.o. female and is here for a comprehensive physical exam. The patient reports no problems.  History   Social History  . Marital Status: Married    Spouse Name: N/A    Number of Children: N/A  . Years of Education: N/A   Occupational History  . Owns [employer]    Social History Main Topics  . Smoking status: Never Smoker   . Smokeless tobacco: Not on file  . Alcohol Use: No  . Drug Use: No  . Sexually Active: Yes -- Female partner(s)   Other Topics Concern  . Not on file   Social History Narrative   Gets reg exercise   Health Maintenance  Topic Date Due  . Tetanus/tdap  05/08/1991  . Influenza Vaccine  11/24/2011  . Pap Smear  10/24/2013    The following portions of the patient's history were reviewed and updated as appropriate: allergies, current medications, past family history, past medical history, past social history, past surgical history and problem list.  Review of Systems Review of Systems  Constitutional: Negative for activity change, appetite change and fatigue.  HENT: Negative for hearing loss, congestion, tinnitus and ear discharge.  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  Respiratory: Negative for cough, chest tightness and shortness of breath.   Cardiovascular: Negative for chest pain, palpitations and leg swelling.  Gastrointestinal: Negative for abdominal pain, diarrhea, constipation and abdominal distention.  Genitourinary: Negative for urgency, frequency, decreased urine volume and difficulty urinating.  Musculoskeletal: Negative for back pain, arthralgias and gait problem.  Skin: Negative for color change, pallor and rash.  Neurological: Negative for dizziness, light-headedness, numbness and headaches.  Hematological: Negative for adenopathy. Does not bruise/bleed easily.  Psychiatric/Behavioral: Negative for suicidal ideas,  confusion, sleep disturbance, self-injury, dysphoric mood, decreased concentration and agitation.       Objective:    BP 126/82  Pulse 84  Temp 98.5 F (36.9 C) (Oral)  Ht 5' 5.75" (1.67 m)  Wt 186 lb 12.8 oz (84.732 kg)  BMI 30.38 kg/m2  SpO2 98% General appearance: alert, cooperative, appears stated age and no distress Head: Normocephalic, without obvious abnormality, atraumatic Eyes: conjunctivae/corneas clear. PERRL, EOM's intact. Fundi benign. Ears: normal TM's and external ear canals both ears Nose: Nares normal. Septum midline. Mucosa normal. No drainage or sinus tenderness. Throat: lips, mucosa, and tongue normal; teeth and gums normal Neck: no adenopathy, no carotid bruit, no JVD, supple, symmetrical, trachea midline and thyroid not enlarged, symmetric, no tenderness/mass/nodules Back: symmetric, no curvature. ROM normal. No CVA tenderness. Lungs: clear to auscultation bilaterally Breasts: gyn Heart: regular rate and rhythm, S1, S2 normal, no murmur, click, rub or gallop Abdomen: soft, non-tender; bowel sounds normal; no masses,  no organomegaly Pelvic: deferred--gyn Extremities: extremities normal, atraumatic, no cyanosis or edema and R 2nd toe--+ corn Pulses: 2+ and symmetric Skin: Skin color, texture, turgor normal. No rashes or lesions Lymph nodes: Cervical, supraclavicular, and axillary nodes normal. Neurologic: Alert and oriented X 3, normal strength and tone. Normal symmetric reflexes. Normal coordination and gait psych-- no depression, no anxieth    Assessment:    Healthy female exam.     Plan:    ghm utd Check fasting labs See After Visit Summary for Counseling Recommendations

## 2011-10-12 NOTE — Patient Instructions (Signed)
Preventive Care for Adults, Female A healthy lifestyle and preventive care can promote health and wellness. Preventive health guidelines for women include the following key practices.  A routine yearly physical is a good way to check with your caregiver about your health and preventive screening. It is a chance to share any concerns and updates on your health, and to receive a thorough exam.   Visit your dentist for a routine exam and preventive care every 6 months. Brush your teeth twice a day and floss once a day. Good oral hygiene prevents tooth decay and gum disease.   The frequency of eye exams is based on your age, health, family medical history, use of contact lenses, and other factors. Follow your caregiver's recommendations for frequency of eye exams.   Eat a healthy diet. Foods like vegetables, fruits, whole grains, low-fat dairy products, and lean protein foods contain the nutrients you need without too many calories. Decrease your intake of foods high in solid fats, added sugars, and salt. Eat the right amount of calories for you.Get information about a proper diet from your caregiver, if necessary.   Regular physical exercise is one of the most important things you can do for your health. Most adults should get at least 150 minutes of moderate-intensity exercise (any activity that increases your heart rate and causes you to sweat) each week. In addition, most adults need muscle-strengthening exercises on 2 or more days a week.   Maintain a healthy weight. The body mass index (BMI) is a screening tool to identify possible weight problems. It provides an estimate of body fat based on height and weight. Your caregiver can help determine your BMI, and can help you achieve or maintain a healthy weight.For adults 20 years and older:   A BMI below 18.5 is considered underweight.   A BMI of 18.5 to 24.9 is normal.   A BMI of 25 to 29.9 is considered overweight.   A BMI of 30 and above is  considered obese.   Maintain normal blood lipids and cholesterol levels by exercising and minimizing your intake of saturated fat. Eat a balanced diet with plenty of fruit and vegetables. Blood tests for lipids and cholesterol should begin at age 20 and be repeated every 5 years. If your lipid or cholesterol levels are high, you are over 50, or you are at high risk for heart disease, you may need your cholesterol levels checked more frequently.Ongoing high lipid and cholesterol levels should be treated with medicines if diet and exercise are not effective.   If you smoke, find out from your caregiver how to quit. If you do not use tobacco, do not start.   If you are pregnant, do not drink alcohol. If you are breastfeeding, be very cautious about drinking alcohol. If you are not pregnant and choose to drink alcohol, do not exceed 1 drink per day. One drink is considered to be 12 ounces (355 mL) of beer, 5 ounces (148 mL) of wine, or 1.5 ounces (44 mL) of liquor.   Avoid use of street drugs. Do not share needles with anyone. Ask for help if you need support or instructions about stopping the use of drugs.   High blood pressure causes heart disease and increases the risk of stroke. Your blood pressure should be checked at least every 1 to 2 years. Ongoing high blood pressure should be treated with medicines if weight loss and exercise are not effective.   If you are 55 to 39   years old, ask your caregiver if you should take aspirin to prevent strokes.   Diabetes screening involves taking a blood sample to check your fasting blood sugar level. This should be done once every 3 years, after age 45, if you are within normal weight and without risk factors for diabetes. Testing should be considered at a younger age or be carried out more frequently if you are overweight and have at least 1 risk factor for diabetes.   Breast cancer screening is essential preventive care for women. You should practice "breast  self-awareness." This means understanding the normal appearance and feel of your breasts and may include breast self-examination. Any changes detected, no matter how small, should be reported to a caregiver. Women in their 20s and 30s should have a clinical breast exam (CBE) by a caregiver as part of a regular health exam every 1 to 3 years. After age 40, women should have a CBE every year. Starting at age 40, women should consider having a mammography (breast X-ray test) every year. Women who have a family history of breast cancer should talk to their caregiver about genetic screening. Women at a high risk of breast cancer should talk to their caregivers about having magnetic resonance imaging (MRI) and a mammography every year.   The Pap test is a screening test for cervical cancer. A Pap test can show cell changes on the cervix that might become cervical cancer if left untreated. A Pap test is a procedure in which cells are obtained and examined from the lower end of the uterus (cervix).   Women should have a Pap test starting at age 21.   Between ages 21 and 29, Pap tests should be repeated every 2 years.   Beginning at age 30, you should have a Pap test every 3 years as long as the past 3 Pap tests have been normal.   Some women have medical problems that increase the chance of getting cervical cancer. Talk to your caregiver about these problems. It is especially important to talk to your caregiver if a new problem develops soon after your last Pap test. In these cases, your caregiver may recommend more frequent screening and Pap tests.   The above recommendations are the same for women who have or have not gotten the vaccine for human papillomavirus (HPV).   If you had a hysterectomy for a problem that was not cancer or a condition that could lead to cancer, then you no longer need Pap tests. Even if you no longer need a Pap test, a regular exam is a good idea to make sure no other problems are  starting.   If you are between ages 65 and 70, and you have had normal Pap tests going back 10 years, you no longer need Pap tests. Even if you no longer need a Pap test, a regular exam is a good idea to make sure no other problems are starting.   If you have had past treatment for cervical cancer or a condition that could lead to cancer, you need Pap tests and screening for cancer for at least 20 years after your treatment.   If Pap tests have been discontinued, risk factors (such as a new sexual partner) need to be reassessed to determine if screening should be resumed.   The HPV test is an additional test that may be used for cervical cancer screening. The HPV test looks for the virus that can cause the cell changes on the cervix.   The cells collected during the Pap test can be tested for HPV. The HPV test could be used to screen women aged 30 years and older, and should be used in women of any age who have unclear Pap test results. After the age of 30, women should have HPV testing at the same frequency as a Pap test.   Colorectal cancer can be detected and often prevented. Most routine colorectal cancer screening begins at the age of 50 and continues through age 75. However, your caregiver may recommend screening at an earlier age if you have risk factors for colon cancer. On a yearly basis, your caregiver may provide home test kits to check for hidden blood in the stool. Use of a small camera at the end of a tube, to directly examine the colon (sigmoidoscopy or colonoscopy), can detect the earliest forms of colorectal cancer. Talk to your caregiver about this at age 50, when routine screening begins. Direct examination of the colon should be repeated every 5 to 10 years through age 75, unless early forms of pre-cancerous polyps or small growths are found.   Hepatitis C blood testing is recommended for all people born from 1945 through 1965 and any individual with known risks for hepatitis C.    Practice safe sex. Use condoms and avoid high-risk sexual practices to reduce the spread of sexually transmitted infections (STIs). STIs include gonorrhea, chlamydia, syphilis, trichomonas, herpes, HPV, and human immunodeficiency virus (HIV). Herpes, HIV, and HPV are viral illnesses that have no cure. They can result in disability, cancer, and death. Sexually active women aged 25 and younger should be checked for chlamydia. Older women with new or multiple partners should also be tested for chlamydia. Testing for other STIs is recommended if you are sexually active and at increased risk.   Osteoporosis is a disease in which the bones lose minerals and strength with aging. This can result in serious bone fractures. The risk of osteoporosis can be identified using a bone density scan. Women ages 65 and over and women at risk for fractures or osteoporosis should discuss screening with their caregivers. Ask your caregiver whether you should take a calcium supplement or vitamin D to reduce the rate of osteoporosis.   Menopause can be associated with physical symptoms and risks. Hormone replacement therapy is available to decrease symptoms and risks. You should talk to your caregiver about whether hormone replacement therapy is right for you.   Use sunscreen with sun protection factor (SPF) of 30 or more. Apply sunscreen liberally and repeatedly throughout the day. You should seek shade when your shadow is shorter than you. Protect yourself by wearing long sleeves, pants, a wide-brimmed hat, and sunglasses year round, whenever you are outdoors.   Once a month, do a whole body skin exam, using a mirror to look at the skin on your back. Notify your caregiver of new moles, moles that have irregular borders, moles that are larger than a pencil eraser, or moles that have changed in shape or color.   Stay current with required immunizations.   Influenza. You need a dose every fall (or winter). The composition of  the flu vaccine changes each year, so being vaccinated once is not enough.   Pneumococcal polysaccharide. You need 1 to 2 doses if you smoke cigarettes or if you have certain chronic medical conditions. You need 1 dose at age 65 (or older) if you have never been vaccinated.   Tetanus, diphtheria, pertussis (Tdap, Td). Get 1 dose of   Tdap vaccine if you are younger than age 65, are over 65 and have contact with an infant, are a healthcare worker, are pregnant, or simply want to be protected from whooping cough. After that, you need a Td booster dose every 10 years. Consult your caregiver if you have not had at least 3 tetanus and diphtheria-containing shots sometime in your life or have a deep or dirty wound.   HPV. You need this vaccine if you are a woman age 26 or younger. The vaccine is given in 3 doses over 6 months.   Measles, mumps, rubella (MMR). You need at least 1 dose of MMR if you were born in 1957 or later. You may also need a second dose.   Meningococcal. If you are age 19 to 21 and a first-year college student living in a residence hall, or have one of several medical conditions, you need to get vaccinated against meningococcal disease. You may also need additional booster doses.   Zoster (shingles). If you are age 60 or older, you should get this vaccine.   Varicella (chickenpox). If you have never had chickenpox or you were vaccinated but received only 1 dose, talk to your caregiver to find out if you need this vaccine.   Hepatitis A. You need this vaccine if you have a specific risk factor for hepatitis A virus infection or you simply wish to be protected from this disease. The vaccine is usually given as 2 doses, 6 to 18 months apart.   Hepatitis B. You need this vaccine if you have a specific risk factor for hepatitis B virus infection or you simply wish to be protected from this disease. The vaccine is given in 3 doses, usually over 6 months.  Preventive Services /  Frequency Ages 19 to 39  Blood pressure check.** / Every 1 to 2 years.   Lipid and cholesterol check.** / Every 5 years beginning at age 20.   Clinical breast exam.** / Every 3 years for women in their 20s and 30s.   Pap test.** / Every 2 years from ages 21 through 29. Every 3 years starting at age 30 through age 65 or 70 with a history of 3 consecutive normal Pap tests.   HPV screening.** / Every 3 years from ages 30 through ages 65 to 70 with a history of 3 consecutive normal Pap tests.   Hepatitis C blood test.** / For any individual with known risks for hepatitis C.   Skin self-exam. / Monthly.   Influenza immunization.** / Every year.   Pneumococcal polysaccharide immunization.** / 1 to 2 doses if you smoke cigarettes or if you have certain chronic medical conditions.   Tetanus, diphtheria, pertussis (Tdap, Td) immunization. / A one-time dose of Tdap vaccine. After that, you need a Td booster dose every 10 years.   HPV immunization. / 3 doses over 6 months, if you are 26 and younger.   Measles, mumps, rubella (MMR) immunization. / You need at least 1 dose of MMR if you were born in 1957 or later. You may also need a second dose.   Meningococcal immunization. / 1 dose if you are age 19 to 21 and a first-year college student living in a residence hall, or have one of several medical conditions, you need to get vaccinated against meningococcal disease. You may also need additional booster doses.   Varicella immunization.** / Consult your caregiver.   Hepatitis A immunization.** / Consult your caregiver. 2 doses, 6 to 18 months   apart.   Hepatitis B immunization.** / Consult your caregiver. 3 doses usually over 6 months.  Ages 40 to 64  Blood pressure check.** / Every 1 to 2 years.   Lipid and cholesterol check.** / Every 5 years beginning at age 20.   Clinical breast exam.** / Every year after age 40.   Mammogram.** / Every year beginning at age 40 and continuing for as  long as you are in good health. Consult with your caregiver.   Pap test.** / Every 3 years starting at age 30 through age 65 or 70 with a history of 3 consecutive normal Pap tests.   HPV screening.** / Every 3 years from ages 30 through ages 65 to 70 with a history of 3 consecutive normal Pap tests.   Fecal occult blood test (FOBT) of stool. / Every year beginning at age 50 and continuing until age 75. You may not need to do this test if you get a colonoscopy every 10 years.   Flexible sigmoidoscopy or colonoscopy.** / Every 5 years for a flexible sigmoidoscopy or every 10 years for a colonoscopy beginning at age 50 and continuing until age 75.   Hepatitis C blood test.** / For all people born from 1945 through 1965 and any individual with known risks for hepatitis C.   Skin self-exam. / Monthly.   Influenza immunization.** / Every year.   Pneumococcal polysaccharide immunization.** / 1 to 2 doses if you smoke cigarettes or if you have certain chronic medical conditions.   Tetanus, diphtheria, pertussis (Tdap, Td) immunization.** / A one-time dose of Tdap vaccine. After that, you need a Td booster dose every 10 years.   Measles, mumps, rubella (MMR) immunization. / You need at least 1 dose of MMR if you were born in 1957 or later. You may also need a second dose.   Varicella immunization.** / Consult your caregiver.   Meningococcal immunization.** / Consult your caregiver.   Hepatitis A immunization.** / Consult your caregiver. 2 doses, 6 to 18 months apart.   Hepatitis B immunization.** / Consult your caregiver. 3 doses, usually over 6 months.  Ages 65 and over  Blood pressure check.** / Every 1 to 2 years.   Lipid and cholesterol check.** / Every 5 years beginning at age 20.   Clinical breast exam.** / Every year after age 40.   Mammogram.** / Every year beginning at age 40 and continuing for as long as you are in good health. Consult with your caregiver.   Pap test.** /  Every 3 years starting at age 30 through age 65 or 70 with a 3 consecutive normal Pap tests. Testing can be stopped between 65 and 70 with 3 consecutive normal Pap tests and no abnormal Pap or HPV tests in the past 10 years.   HPV screening.** / Every 3 years from ages 30 through ages 65 or 70 with a history of 3 consecutive normal Pap tests. Testing can be stopped between 65 and 70 with 3 consecutive normal Pap tests and no abnormal Pap or HPV tests in the past 10 years.   Fecal occult blood test (FOBT) of stool. / Every year beginning at age 50 and continuing until age 75. You may not need to do this test if you get a colonoscopy every 10 years.   Flexible sigmoidoscopy or colonoscopy.** / Every 5 years for a flexible sigmoidoscopy or every 10 years for a colonoscopy beginning at age 50 and continuing until age 75.   Hepatitis   C blood test.** / For all people born from 1945 through 1965 and any individual with known risks for hepatitis C.   Osteoporosis screening.** / A one-time screening for women ages 65 and over and women at risk for fractures or osteoporosis.   Skin self-exam. / Monthly.   Influenza immunization.** / Every year.   Pneumococcal polysaccharide immunization.** / 1 dose at age 65 (or older) if you have never been vaccinated.   Tetanus, diphtheria, pertussis (Tdap, Td) immunization. / A one-time dose of Tdap vaccine if you are over 65 and have contact with an infant, are a healthcare worker, or simply want to be protected from whooping cough. After that, you need a Td booster dose every 10 years.   Varicella immunization.** / Consult your caregiver.   Meningococcal immunization.** / Consult your caregiver.   Hepatitis A immunization.** / Consult your caregiver. 2 doses, 6 to 18 months apart.   Hepatitis B immunization.** / Check with your caregiver. 3 doses, usually over 6 months.  ** Family history and personal history of risk and conditions may change your caregiver's  recommendations. Document Released: 04/07/2001 Document Revised: 01/29/2011 Document Reviewed: 07/07/2010 [employer] Patient Information 2012 [employer]. 

## 2011-10-12 NOTE — Assessment & Plan Note (Signed)
Doing well with meds Refill celexa

## 2012-04-09 ENCOUNTER — Other Ambulatory Visit: Payer: Self-pay

## 2012-08-29 ENCOUNTER — Ambulatory Visit: Payer: BC Managed Care – PPO | Admitting: Endocrinology

## 2012-12-29 ENCOUNTER — Other Ambulatory Visit: Payer: Self-pay

## 2013-01-24 ENCOUNTER — Other Ambulatory Visit: Payer: Self-pay

## 2013-01-24 MED ORDER — CITALOPRAM HYDROBROMIDE 20 MG PO TABS: 20.0000 mg | ORAL_TABLET | Freq: Every day | ORAL | Status: DC

## 2013-03-10 ENCOUNTER — Telehealth: Payer: Self-pay | Admitting: Family Medicine

## 2013-03-10 ENCOUNTER — Other Ambulatory Visit: Payer: Self-pay | Admitting: Family Medicine

## 2013-03-10 MED ORDER — CITALOPRAM HYDROBROMIDE 20 MG PO TABS: 20.0000 mg | ORAL_TABLET | Freq: Every day | ORAL | Status: DC

## 2013-03-10 NOTE — Telephone Encounter (Signed)
Patient is calling to discuss her Citalopram rx. She needs refilled for this month but states that she also needs to speak with someone about getting a copy of her previous refill. Please advise.

## 2013-03-10 NOTE — Telephone Encounter (Signed)
Rx sent and patient requested a copy of the dates the medication had been faxed to the pharmacy in the past. Faxed to patient's fax per her request.,         KP

## 2013-03-31 ENCOUNTER — Ambulatory Visit: Payer: BC Managed Care – PPO | Admitting: Family Medicine

## 2013-04-11 ENCOUNTER — Ambulatory Visit: Payer: BC Managed Care – PPO | Admitting: Family Medicine

## 2013-04-14 ENCOUNTER — Ambulatory Visit: Payer: BC Managed Care – PPO | Admitting: Family Medicine

## 2013-04-18 ENCOUNTER — Ambulatory Visit: Payer: BC Managed Care – PPO | Admitting: Family Medicine

## 2013-04-24 ENCOUNTER — Ambulatory Visit (INDEPENDENT_AMBULATORY_CARE_PROVIDER_SITE_OTHER): Payer: BC Managed Care – PPO | Admitting: Family Medicine

## 2013-04-24 ENCOUNTER — Encounter: Payer: Self-pay | Admitting: Family Medicine

## 2013-04-24 VITALS — BP 120/74 | HR 73 | Temp 98.5°F | Wt 199.8 lb

## 2013-04-24 DIAGNOSIS — F341 Dysthymic disorder: Secondary | ICD-10-CM

## 2013-04-24 DIAGNOSIS — G2581 Restless legs syndrome: Secondary | ICD-10-CM

## 2013-04-24 DIAGNOSIS — E669 Obesity, unspecified: Secondary | ICD-10-CM

## 2013-04-24 DIAGNOSIS — Z1239 Encounter for other screening for malignant neoplasm of breast: Secondary | ICD-10-CM

## 2013-04-24 DIAGNOSIS — F418 Other specified anxiety disorders: Secondary | ICD-10-CM

## 2013-04-24 DIAGNOSIS — D509 Iron deficiency anemia, unspecified: Secondary | ICD-10-CM

## 2013-04-24 HISTORY — DX: Obesity, unspecified: E66.9

## 2013-04-24 LAB — CBC WITH DIFFERENTIAL/PLATELET
BASOS ABS: 0.1 10*3/uL (ref 0.0–0.1)
Basophils Relative: 1.4 % (ref 0.0–3.0)
EOS ABS: 0.1 10*3/uL (ref 0.0–0.7)
Eosinophils Relative: 2.1 % (ref 0.0–5.0)
HCT: 36.6 % (ref 36.0–46.0)
HEMOGLOBIN: 11.5 g/dL — AB (ref 12.0–15.0)
LYMPHS PCT: 36.9 % (ref 12.0–46.0)
Lymphs Abs: 1.9 10*3/uL (ref 0.7–4.0)
MCHC: 31.3 g/dL (ref 30.0–36.0)
MCV: 75.8 fl — ABNORMAL LOW (ref 78.0–100.0)
MONOS PCT: 5.6 % (ref 3.0–12.0)
Monocytes Absolute: 0.3 10*3/uL (ref 0.1–1.0)
NEUTROS ABS: 2.8 10*3/uL (ref 1.4–7.7)
Neutrophils Relative %: 54 % (ref 43.0–77.0)
PLATELETS: 305 10*3/uL (ref 150.0–400.0)
RBC: 4.83 Mil/uL (ref 3.87–5.11)
RDW: 19.1 % — AB (ref 11.5–14.6)
WBC: 5.2 10*3/uL (ref 4.5–10.5)

## 2013-04-24 LAB — BASIC METABOLIC PANEL
BUN: 5 mg/dL — AB (ref 6–23)
CALCIUM: 8.8 mg/dL (ref 8.4–10.5)
CO2: 27 meq/L (ref 19–32)
CREATININE: 0.5 mg/dL (ref 0.4–1.2)
Chloride: 106 mEq/L (ref 96–112)
GFR: 135.14 mL/min (ref >60.00)
GLUCOSE: 76 mg/dL (ref 70–99)
Potassium: 3.9 mEq/L (ref 3.5–5.1)
Sodium: 138 mEq/L (ref 135–145)

## 2013-04-24 LAB — FERRITIN: Ferritin: 8.2 ng/mL — ABNORMAL LOW (ref 10.0–291.0)

## 2013-04-24 LAB — IBC PANEL
Iron: 20 ug/dL — ABNORMAL LOW (ref 42–145)
Saturation Ratios: 4.7 % — ABNORMAL LOW (ref 20.0–50.0)
TRANSFERRIN: 306.9 mg/dL (ref 212.0–360.0)

## 2013-04-24 LAB — HEPATIC FUNCTION PANEL
ALBUMIN: 3.9 g/dL (ref 3.5–5.2)
ALK PHOS: 68 U/L (ref 39–117)
ALT: 17 U/L (ref 0–35)
AST: 22 U/L (ref 0–37)
BILIRUBIN TOTAL: 0.7 mg/dL (ref 0.3–1.2)
Bilirubin, Direct: 0 mg/dL (ref 0.0–0.3)
Total Protein: 7 g/dL (ref 6.0–8.3)

## 2013-04-24 LAB — VITAMIN B12: VITAMIN B 12: 149 pg/mL — AB (ref 211–911)

## 2013-04-24 MED ORDER — PRAMIPEXOLE DIHYDROCHLORIDE 0.125 MG PO TABS: ORAL_TABLET | ORAL | Status: DC

## 2013-04-24 MED ORDER — CITALOPRAM HYDROBROMIDE 20 MG PO TABS: 20.0000 mg | ORAL_TABLET | Freq: Every day | ORAL | Status: DC

## 2013-04-24 MED ORDER — ALPRAZOLAM 0.25 MG PO TABS: 0.2500 mg | ORAL_TABLET | Freq: Three times a day (TID) | ORAL | Status: DC | PRN

## 2013-04-24 NOTE — Patient Instructions (Signed)

## 2013-04-24 NOTE — Progress Notes (Signed)
Pre visit review using our clinic review tool, if applicable. No additional management support is needed unless otherwise documented below in the visit note. 

## 2013-04-24 NOTE — Progress Notes (Signed)
Patient ID: Tina Frost, female   DOB: Apr 07, 1970, 43 y.o.   MRN: [id number]   Subjective:    Patient ID: Tina Frost, female    DOB: 10-Jun-1970, 43 y.o.   MRN: [id number] HPI Pt here to f/u depression -- she is fatigued but is not sleeping much secondary to Rls. She feels the depression is actually better with medication.            Objective:    BP 120/74  Pulse 73  Temp(Src) 98.5 F (36.9 C) (Oral)  Wt 199 lb 12.8 oz (90.629 kg)  SpO2 99%  LMP 04/24/2013 General appearance: alert, cooperative, appears stated age and no distress Nose: Nares normal. Septum midline. Mucosa normal. No drainage or sinus tenderness. Throat: lips, mucosa, and tongue normal; teeth and gums normal Neck: no adenopathy, no carotid bruit, no JVD, supple, symmetrical, trachea midline and thyroid not enlarged, symmetric, no tenderness/mass/nodules Lungs: clear to auscultation bilaterally Heart: S1, S2 normal Extremities: extremities normal, atraumatic, no cyanosis or edema        Assessment & Plan:  1. RLS (restless legs syndrome) Start meds - Basic metabolic panel - CBC with Differential - Hepatic function panel - Vitamin B12 - Vitamin D 1,25 dihydroxy - IBC panel - Ferritin - pramipexole (MIRAPEX) 0.125 MG tablet; 1 po qhs x 4-7 days then increase to 2 po qhs if needed  Dispense: 60 tablet; Refill: 2  2. Iron deficiency anemia Check labs - CBC with Differential - IBC panel - Ferritin  3. Depression with anxiety Stable con't meds - ALPRAZolam (XANAX) 0.25 MG tablet; Take 1 tablet (0.25 mg total) by mouth 3 (three) times daily as needed for anxiety.  Dispense: 10 tablet; Refill: 0 con't celexa 4. Other screening breast examination  - MM DIGITAL SCREENING BILATERAL; Future

## 2013-04-26 ENCOUNTER — Telehealth: Payer: Self-pay | Admitting: Family Medicine

## 2013-04-26 ENCOUNTER — Telehealth: Payer: Self-pay | Admitting: *Deleted

## 2013-04-26 ENCOUNTER — Encounter: Payer: Self-pay | Admitting: Family Medicine

## 2013-04-26 NOTE — Telephone Encounter (Signed)
Spoke with patient who prefers to receive injection at OR office due to proximity to their home.

## 2013-04-26 NOTE — Telephone Encounter (Signed)
Attempted to call pt regarding lab results and recommended followup. Left message on voice mail to call the office to schedule B-12 injections (once a week for 4 weeks then monthly)

## 2013-04-26 NOTE — Telephone Encounter (Signed)
She is a patient of Dr Etter Sjogren.

## 2013-04-26 NOTE — Telephone Encounter (Signed)
Message copied by Chilton Greathouse on Wed Apr 26, 2013  9:08 AM ------      Message from: Rosalita Chessman      Created: Mon Apr 24, 2013 10:01 PM       Anemic----  Take multivitamin with iron daily      b12 also low---- she will benefit from b12 injections weekly x4 then monthly       Recheck 1 month ------

## 2013-04-26 NOTE — Telephone Encounter (Signed)
Patient needs B12 injections. Can she have the injections in our office?

## 2013-04-26 NOTE — Telephone Encounter (Signed)
Chart reviewed. Ok to get Vit B12 injections here. 1000 mcg IM q week x 4 weeks, then 1000 mcg IM q month.   Needs vit B12 level repeated in 2 mo (just prior to her first "monthly" injection.)-thx

## 2013-04-26 NOTE — Telephone Encounter (Signed)
Called pt to let her ok to get her Vit B12 injections here. Pt understood and will come in tomorrow.

## 2013-04-27 ENCOUNTER — Ambulatory Visit (INDEPENDENT_AMBULATORY_CARE_PROVIDER_SITE_OTHER): Payer: BC Managed Care – PPO | Admitting: Family Medicine

## 2013-04-27 DIAGNOSIS — E538 Deficiency of other specified B group vitamins: Secondary | ICD-10-CM

## 2013-04-27 LAB — VITAMIN D 1,25 DIHYDROXY
Vitamin D 1, 25 (OH)2 Total: 71 pg/mL (ref 18–72)
Vitamin D3 1, 25 (OH)2: 71 pg/mL

## 2013-04-27 MED ORDER — CYANOCOBALAMIN 1000 MCG/ML IJ SOLN
1000.0000 ug | Freq: Once | INTRAMUSCULAR | Status: AC
  Administered 2013-04-27: 1000 ug via INTRAMUSCULAR

## 2013-04-27 NOTE — Progress Notes (Signed)
Spoke with patient and advised of lab results

## 2013-05-03 ENCOUNTER — Ambulatory Visit (INDEPENDENT_AMBULATORY_CARE_PROVIDER_SITE_OTHER): Payer: BC Managed Care – PPO | Admitting: Family Medicine

## 2013-05-03 DIAGNOSIS — E538 Deficiency of other specified B group vitamins: Secondary | ICD-10-CM

## 2013-05-03 MED ORDER — CYANOCOBALAMIN 1000 MCG/ML IJ SOLN
1000.0000 ug | Freq: Once | INTRAMUSCULAR | Status: AC
  Administered 2013-05-03: 1000 ug via INTRAMUSCULAR

## 2013-05-04 ENCOUNTER — Ambulatory Visit: Payer: BC Managed Care – PPO

## 2013-05-10 ENCOUNTER — Ambulatory Visit (INDEPENDENT_AMBULATORY_CARE_PROVIDER_SITE_OTHER): Payer: BC Managed Care – PPO

## 2013-05-10 ENCOUNTER — Ambulatory Visit: Payer: BC Managed Care – PPO

## 2013-05-10 DIAGNOSIS — E538 Deficiency of other specified B group vitamins: Secondary | ICD-10-CM

## 2013-05-10 MED ORDER — CYANOCOBALAMIN 1000 MCG/ML IJ SOLN
1000.0000 ug | Freq: Once | INTRAMUSCULAR | Status: AC
  Administered 2013-05-10: 1000 ug via INTRAMUSCULAR

## 2013-05-18 ENCOUNTER — Ambulatory Visit (INDEPENDENT_AMBULATORY_CARE_PROVIDER_SITE_OTHER): Payer: BC Managed Care – PPO | Admitting: Family Medicine

## 2013-05-18 DIAGNOSIS — E538 Deficiency of other specified B group vitamins: Secondary | ICD-10-CM

## 2013-05-18 MED ORDER — CYANOCOBALAMIN 1000 MCG/ML IJ SOLN
1000.0000 ug | Freq: Once | INTRAMUSCULAR | Status: AC
  Administered 2013-05-18: 1000 ug via INTRAMUSCULAR

## 2013-06-15 ENCOUNTER — Other Ambulatory Visit (INDEPENDENT_AMBULATORY_CARE_PROVIDER_SITE_OTHER): Payer: BC Managed Care – PPO

## 2013-06-15 ENCOUNTER — Ambulatory Visit (INDEPENDENT_AMBULATORY_CARE_PROVIDER_SITE_OTHER): Payer: BC Managed Care – PPO | Admitting: *Deleted

## 2013-06-15 DIAGNOSIS — E538 Deficiency of other specified B group vitamins: Secondary | ICD-10-CM

## 2013-06-15 DIAGNOSIS — D681 Hereditary factor XI deficiency: Secondary | ICD-10-CM

## 2013-06-15 LAB — VITAMIN B12: VITAMIN B 12: 217 pg/mL (ref 211–911)

## 2013-06-15 MED ORDER — CYANOCOBALAMIN 1000 MCG/ML IJ SOLN
1000.0000 ug | Freq: Once | INTRAMUSCULAR | Status: AC
  Administered 2013-06-15: 1000 ug via INTRAMUSCULAR

## 2013-06-19 ENCOUNTER — Telehealth: Payer: Self-pay | Admitting: Family Medicine

## 2013-06-19 NOTE — Telephone Encounter (Signed)
Patient called and wanted to know when to come in a get her B-12 and said she did not see the iron on her lab results.  Anemic---- Take multivitamin with iron daily  b12 also low---- she will benefit from b12 injections weekly x4 then monthly  Recheck 1 month I advised of the above information and made her aware the IBC and Ferritin were to check the iron. She voiced understanding.        KP

## 2013-06-19 NOTE — Telephone Encounter (Signed)
Caller name:Tina Frost Relation to GN:[id number] Call back number:[phone] Pharmacy:  Reason for call: Patient has questions about her recent lab work and also she though that her Iron was going to get checked during that visit. Please advise.

## 2013-06-20 ENCOUNTER — Telehealth: Payer: Self-pay | Admitting: *Deleted

## 2013-06-20 NOTE — Telephone Encounter (Signed)
Spoke with patient and made aware that she needs to continue B-12 monthly injections and MVI with iron added. She is not to continue OTC iron 325mg  .

## 2013-06-20 NOTE — Telephone Encounter (Signed)
Patient called office to confirm that she is to continue once monthly b-12 injections. Also pt is requesting results for iron. Please advise?

## 2013-06-20 NOTE — Telephone Encounter (Signed)
Reviewed pts chart and found that on 04/24/13 pts B-12 level was 149, Iron level 20 and Ferritin 8.2. Pt was instructed to begin B-12 injections weekly x4, then monthly. Patient was also instructed to take MVI with iron daily at that time. Pt stated that she was already taking a MVI with iron at the time the labs were drawn. Labs were to be rechecked in 1 month.   On 06/15/13, B-12 was rechecked ( 217) and note stated to continue injections. Did you want weekly or monthly injections of B-12 and do you want pt to continue taking OTC iron 325mg  as she was instructed to do the beginning of March? Please advise.

## 2013-06-20 NOTE — Telephone Encounter (Signed)
con't monthly injections and mvi with iron

## 2013-07-14 ENCOUNTER — Telehealth: Payer: Self-pay | Admitting: *Deleted

## 2013-07-14 ENCOUNTER — Ambulatory Visit (INDEPENDENT_AMBULATORY_CARE_PROVIDER_SITE_OTHER): Payer: BC Managed Care – PPO | Admitting: *Deleted

## 2013-07-14 DIAGNOSIS — E538 Deficiency of other specified B group vitamins: Secondary | ICD-10-CM

## 2013-07-14 MED ORDER — CYANOCOBALAMIN 1000 MCG/ML IJ SOLN
1000.0000 ug | Freq: Once | INTRAMUSCULAR | Status: AC
  Administered 2013-07-14: 1000 ug via INTRAMUSCULAR

## 2013-07-14 NOTE — Telephone Encounter (Signed)
Pt came to [facility] office for b-12 injection. Pt wants to know when to get her next b-12 lab?

## 2013-07-14 NOTE — Telephone Encounter (Signed)
1 month

## 2013-07-14 NOTE — Telephone Encounter (Signed)
My-chart message sent      KP 

## 2013-07-14 NOTE — Telephone Encounter (Signed)
Last labs done on 06/15/13. Please advise      KP

## 2013-07-21 ENCOUNTER — Ambulatory Visit: Admit: 2013-07-21 | Payer: Self-pay | Admitting: Obstetrics and Gynecology

## 2013-07-21 SURGERY — HYSTEROSCOPY WITH NOVASURE
Anesthesia: General

## 2013-08-11 ENCOUNTER — Ambulatory Visit (INDEPENDENT_AMBULATORY_CARE_PROVIDER_SITE_OTHER): Payer: BC Managed Care – PPO

## 2013-08-11 DIAGNOSIS — E538 Deficiency of other specified B group vitamins: Secondary | ICD-10-CM

## 2013-08-11 MED ORDER — CYANOCOBALAMIN 1000 MCG/ML IJ SOLN
1000.0000 ug | Freq: Once | INTRAMUSCULAR | Status: AC
  Administered 2013-08-11: 1000 ug via INTRAMUSCULAR

## 2013-09-08 ENCOUNTER — Telehealth: Payer: Self-pay | Admitting: Family Medicine

## 2013-09-08 NOTE — Telephone Encounter (Signed)
Caller name: Elzada  Relation to pt: Call back number:[phone] Pharmacy: [facility]   Reason for call:  Pt thinks she has a UTI and is out of town.  Pt would like something called in for her.  Please advise, call pt back either way.

## 2013-09-08 NOTE — Telephone Encounter (Signed)
Advised patient that we are unable to prescribe antibiotics without seeing the patient. Ask what is she suppose to do because she is out of town. Advised to go to a walk in facility in the area that she is in. Patient states that she will just get her OB/GYN to do it. Hung up!

## 2013-09-11 ENCOUNTER — Ambulatory Visit: Payer: BC Managed Care – PPO

## 2013-09-12 ENCOUNTER — Ambulatory Visit: Payer: BC Managed Care – PPO

## 2013-09-13 ENCOUNTER — Telehealth: Payer: Self-pay | Admitting: Family Medicine

## 2013-09-13 NOTE — Telephone Encounter (Signed)
Pt is calling again requesting a lab order be placed to recheck patient's vitamin b-12 and iron levels so that she can continue to receive vitamin b-12 injections at [facility].    Please advise.

## 2013-09-13 NOTE — Telephone Encounter (Signed)
Caller name: Ashante  Call back number:[phone]   Reason for call:   Pt called in wanting to get her b12 order placed so she can have it done at the [facility]  Pt states that the [facility] office has been calling us all week to have this done.  Advised pt that I saw no indication of this in the system.  Pt demanded to have this done today, and not at 5:00 or tomorrow.

## 2013-09-13 NOTE — Telephone Encounter (Signed)
Pt was very rude during this interaction.  They demanded me to find someone to do this.  They stated that I needed to find someone to do their job since I couldn't.  They stated that they were going to call my manager to get this order placed.

## 2013-09-13 NOTE — Telephone Encounter (Signed)
Called pt and lmovm to inform that per a mychart message from [place] on 4/23 pt was suppose to repeat her labs in 1 month. There are future labs already in the system. Pt only has to call the [place] office and schedule an appt.

## 2013-09-13 NOTE — Telephone Encounter (Signed)
See below

## 2013-09-14 ENCOUNTER — Other Ambulatory Visit: Payer: Self-pay | Admitting: Family Medicine

## 2013-09-14 ENCOUNTER — Telehealth: Payer: Self-pay | Admitting: Family Medicine

## 2013-09-14 DIAGNOSIS — D649 Anemia, unspecified: Secondary | ICD-10-CM

## 2013-09-14 NOTE — Telephone Encounter (Addendum)
Patient has an appt 09/15/13 to have her B12 injection & to have a blood draw to check her vitamin B level & her iron level. We have the order for B. Can you put in an order for iron? Thanks

## 2013-09-14 NOTE — Telephone Encounter (Signed)
Already placed and her appt was edited to include

## 2013-09-15 ENCOUNTER — Ambulatory Visit (INDEPENDENT_AMBULATORY_CARE_PROVIDER_SITE_OTHER): Payer: BC Managed Care – PPO

## 2013-09-15 ENCOUNTER — Other Ambulatory Visit (INDEPENDENT_AMBULATORY_CARE_PROVIDER_SITE_OTHER): Payer: BC Managed Care – PPO

## 2013-09-15 DIAGNOSIS — E538 Deficiency of other specified B group vitamins: Secondary | ICD-10-CM

## 2013-09-15 DIAGNOSIS — D649 Anemia, unspecified: Secondary | ICD-10-CM

## 2013-09-15 LAB — FERRITIN: Ferritin: 21.3 ng/mL (ref 10.0–291.0)

## 2013-09-15 LAB — VITAMIN B12: Vitamin B-12: 257 pg/mL (ref 211–911)

## 2013-09-15 MED ORDER — CYANOCOBALAMIN 1000 MCG/ML IJ SOLN
1000.0000 ug | Freq: Once | INTRAMUSCULAR | Status: AC
  Administered 2013-09-15: 1000 ug via INTRAMUSCULAR

## 2013-09-15 NOTE — Telephone Encounter (Signed)
Thank you :)

## 2013-09-25 MED ORDER — CYANOCOBALAMIN 1000 MCG/ML IJ SOLN
1000.0000 ug | Freq: Once | INTRAMUSCULAR | Status: AC
  Administered 2013-09-15: 1000 ug via INTRAMUSCULAR

## 2013-09-26 ENCOUNTER — Ambulatory Visit (INDEPENDENT_AMBULATORY_CARE_PROVIDER_SITE_OTHER): Payer: BC Managed Care – PPO | Admitting: *Deleted

## 2013-09-26 DIAGNOSIS — E538 Deficiency of other specified B group vitamins: Secondary | ICD-10-CM

## 2013-09-26 MED ORDER — CYANOCOBALAMIN 1000 MCG/ML IJ SOLN
1000.0000 ug | Freq: Once | INTRAMUSCULAR | Status: AC
  Administered 2013-09-26: 1000 ug via INTRAMUSCULAR

## 2013-10-09 ENCOUNTER — Ambulatory Visit (INDEPENDENT_AMBULATORY_CARE_PROVIDER_SITE_OTHER): Payer: BC Managed Care – PPO | Admitting: *Deleted

## 2013-10-09 DIAGNOSIS — E538 Deficiency of other specified B group vitamins: Secondary | ICD-10-CM

## 2013-10-09 MED ORDER — CYANOCOBALAMIN 1000 MCG/ML IJ SOLN
1000.0000 ug | Freq: Once | INTRAMUSCULAR | Status: AC
  Administered 2013-10-09: 1000 ug via INTRAMUSCULAR

## 2013-10-16 ENCOUNTER — Ambulatory Visit (INDEPENDENT_AMBULATORY_CARE_PROVIDER_SITE_OTHER): Payer: BC Managed Care – PPO | Admitting: Family Medicine

## 2013-10-16 DIAGNOSIS — E538 Deficiency of other specified B group vitamins: Secondary | ICD-10-CM

## 2013-10-16 MED ORDER — CYANOCOBALAMIN 1000 MCG/ML IJ SOLN
1000.0000 ug | Freq: Once | INTRAMUSCULAR | Status: AC
  Administered 2013-10-16: 1000 ug via INTRAMUSCULAR

## 2013-11-02 ENCOUNTER — Other Ambulatory Visit: Payer: Self-pay | Admitting: Family Medicine

## 2013-11-02 DIAGNOSIS — F418 Other specified anxiety disorders: Secondary | ICD-10-CM

## 2013-11-03 MED ORDER — ALPRAZOLAM 0.25 MG PO TABS: 0.2500 mg | ORAL_TABLET | Freq: Three times a day (TID) | ORAL | Status: DC | PRN

## 2013-11-03 NOTE — Telephone Encounter (Signed)
Last seen and filled 04/24/13 #10. Please advise    KP

## 2013-11-13 ENCOUNTER — Ambulatory Visit (INDEPENDENT_AMBULATORY_CARE_PROVIDER_SITE_OTHER): Payer: BC Managed Care – PPO

## 2013-11-13 DIAGNOSIS — E538 Deficiency of other specified B group vitamins: Secondary | ICD-10-CM

## 2013-11-13 MED ORDER — CYANOCOBALAMIN 1000 MCG/ML IJ SOLN
1000.0000 ug | Freq: Once | INTRAMUSCULAR | Status: AC
  Administered 2013-11-13: 1000 ug via INTRAMUSCULAR

## 2013-12-13 ENCOUNTER — Other Ambulatory Visit: Payer: BC Managed Care – PPO

## 2013-12-14 ENCOUNTER — Other Ambulatory Visit (INDEPENDENT_AMBULATORY_CARE_PROVIDER_SITE_OTHER): Payer: BC Managed Care – PPO

## 2013-12-14 DIAGNOSIS — E538 Deficiency of other specified B group vitamins: Secondary | ICD-10-CM

## 2013-12-14 MED ORDER — CYANOCOBALAMIN 1000 MCG/ML IJ SOLN
1000.0000 ug | Freq: Once | INTRAMUSCULAR | Status: AC
  Administered 2013-12-14: 1000 ug via INTRAMUSCULAR

## 2013-12-15 LAB — VITAMIN B12: VITAMIN B 12: 333 pg/mL (ref 211–911)

## 2013-12-15 LAB — FERRITIN: FERRITIN: 15.1 ng/mL (ref 10.0–291.0)

## 2013-12-19 ENCOUNTER — Telehealth: Payer: Self-pay | Admitting: Family Medicine

## 2013-12-19 NOTE — Telephone Encounter (Signed)
Advise pt, labs are normal

## 2013-12-19 NOTE — Telephone Encounter (Signed)
She can try B12 SL daily--- and we can recheck in 3 months to see if it is effective

## 2013-12-19 NOTE — Telephone Encounter (Signed)
Caller name: Cailee  Call back number:[phone]   Reason for call:  Pt has more questions about lab work that was done last week.  wants to speak about treatment options.

## 2013-12-19 NOTE — Telephone Encounter (Signed)
Spoke with patient and she voiced understanding and agreed to the SL B-12 2,500 mcg daily.     KP

## 2013-12-19 NOTE — Telephone Encounter (Signed)
Spoke with patient and she said she reviewed her results but there was no plan and she wanted to know what she should do. Please advise     KP

## 2014-02-09 ENCOUNTER — Encounter: Payer: Self-pay | Admitting: Internal Medicine

## 2014-02-09 ENCOUNTER — Ambulatory Visit (INDEPENDENT_AMBULATORY_CARE_PROVIDER_SITE_OTHER): Payer: BC Managed Care – PPO | Admitting: Internal Medicine

## 2014-02-09 VITALS — BP 132/82 | HR 82 | Temp 97.8°F | Wt 180.0 lb

## 2014-02-09 DIAGNOSIS — J0100 Acute maxillary sinusitis, unspecified: Secondary | ICD-10-CM

## 2014-02-09 MED ORDER — AMOXICILLIN 500 MG PO CAPS: 1000.0000 mg | ORAL_CAPSULE | Freq: Two times a day (BID) | ORAL | Status: DC

## 2014-02-09 NOTE — Patient Instructions (Signed)
Rest, fluids , tylenol If  cough, take Mucinex DM twice a day as needed  If nasal  congestion use OTC Nasocort or Flonase : 2 nasal sprays on each side of the nose daily until you feel better Get pseudoephedrine 30 mg (behind the counter, you need to talk with the pharmacist) take one tablet 3 or 4 times a day as needed for congestion Take the antibiotic as prescribed  (Amoxicillin) Call if not gradually better over the next  10 days Call anytime if the symptoms are severe

## 2014-02-09 NOTE — Progress Notes (Signed)
   Subjective:    Patient ID: Tina Frost, female    DOB: 1970-10-19, 43 y.o.   MRN: [id number]  DOS:  02/09/2014 Type of visit - description : acute Interval history: 3.5 weeks history of pressure and pain at the sinuses bilaterally, sore throat, some cough with sputum. She feels that mucus is coming from postnasal dripping. Has taken  OTCs without much improvement   ROS Denies fever chills No nausea, vomiting, diarrhea. No chest congestion or wheezing  Past Medical History  Diagnosis Date  . Abdominal pain   . Hyperlipidemia   . Kidney stone   . Depression     Past Surgical History  Procedure Laterality Date  . Colonoscopy  2002  . Appendectomy    . Tubal ligation    . Kidney stone surgery  2008    History   Social History  . Marital Status: Married    Spouse Name: N/A    Number of Children: N/A  . Years of Education: N/A   Occupational History  . Owns [employer]    Social History Main Topics  . Smoking status: Never Smoker   . Smokeless tobacco: Not on file  . Alcohol Use: No  . Drug Use: No  . Sexual Activity:    Partners: Male   Other Topics Concern  . Not on file   Social History Narrative   Gets reg exercise        Medication List       This list is accurate as of: 02/09/14 11:59 PM.  Always use your most recent med list.               ALPRAZolam 0.25 MG tablet  Commonly known as:  XANAX  Take 1 tablet (0.25 mg total) by mouth 3 (three) times daily as needed for anxiety.     amoxicillin 500 MG capsule  Commonly known as:  AMOXIL  Take 2 capsules (1,000 mg total) by mouth 2 (two) times daily.     citalopram 20 MG tablet  Commonly known as:  CELEXA  Take 1 tablet (20 mg total) by mouth daily.     pramipexole 0.125 MG tablet  Commonly known as:  MIRAPEX  1 po qhs x 4-7 days then increase to 2 po qhs if needed           Objective:   Physical Exam BP 132/82 mmHg  Pulse 82  Temp(Src) 97.8 F (36.6 C) (Oral)  Wt 180 lb  (81.647 kg)  SpO2 98%  LMP 02/09/2014 General -- alert, well-developed, NAD.   HEENT-- Not pale.  R Ear-- normal L ear-- normal Throat symmetric, no redness or discharge. Face symmetric, sinuses TTP B maxilary area, no TTP frontal areas. Nose   congested Lungs -- normal respiratory effort, no intercostal retractions, no accessory muscle use, and normal breath sounds.  Heart-- normal rate, regular rhythm, no murmur.  Neurologic--  alert & oriented X3. Speech normal, gait appropriate for age, strength symmetric and appropriate for age.  Psych-- Cognition and judgment appear intact. Cooperative with normal attention span and concentration. No anxious or depressed appearing.     Assessment & Plan:   Sinusitis, Symptoms and findings consistent with maxillary sinusitis, see instructions

## 2014-02-09 NOTE — Progress Notes (Signed)
Pre visit review using our clinic review tool, if applicable. No additional management support is needed unless otherwise documented below in the visit note. 

## 2014-02-20 ENCOUNTER — Encounter: Payer: Self-pay | Admitting: Family Medicine

## 2014-02-20 NOTE — Telephone Encounter (Signed)
She really should be sending message through her daughters chart not hers-- not documentation is in the wrong chart.    We can give names and numbers of psych to help with treatment.

## 2014-03-26 ENCOUNTER — Encounter: Payer: Self-pay | Admitting: Family Medicine

## 2014-03-26 DIAGNOSIS — E538 Deficiency of other specified B group vitamins: Secondary | ICD-10-CM

## 2014-03-26 NOTE — Telephone Encounter (Signed)
? 

## 2014-03-26 NOTE — Telephone Encounter (Signed)
Please advise these orders are appropriate.     KP

## 2014-04-04 ENCOUNTER — Telehealth: Payer: Self-pay | Admitting: Family Medicine

## 2014-04-04 NOTE — Telephone Encounter (Signed)
Patient states Dr. Etter Sjogren faxed orders to our office for bloodwork. Did you receive them? Can she come here for her labs?

## 2014-04-04 NOTE — Telephone Encounter (Signed)
Yes they're ordered.  She can come on Tuesday afternoons when the lab is open.

## 2014-04-05 ENCOUNTER — Other Ambulatory Visit: Payer: Self-pay | Admitting: Family Medicine

## 2014-04-05 DIAGNOSIS — F418 Other specified anxiety disorders: Secondary | ICD-10-CM

## 2014-04-05 MED ORDER — ALPRAZOLAM 0.25 MG PO TABS: 0.2500 mg | ORAL_TABLET | Freq: Three times a day (TID) | ORAL | Status: DC | PRN

## 2014-04-05 NOTE — Telephone Encounter (Signed)
Patient will be here Tuesday afternoon

## 2014-04-05 NOTE — Telephone Encounter (Signed)
Last seen 04/24/13 and filled 11/03/13 #10.    Please advise    KP

## 2014-04-10 ENCOUNTER — Other Ambulatory Visit: Payer: BC Managed Care – PPO

## 2014-04-11 ENCOUNTER — Other Ambulatory Visit (INDEPENDENT_AMBULATORY_CARE_PROVIDER_SITE_OTHER): Payer: BC Managed Care – PPO

## 2014-04-11 DIAGNOSIS — E538 Deficiency of other specified B group vitamins: Secondary | ICD-10-CM

## 2014-04-11 LAB — FERRITIN: Ferritin: 26.9 ng/mL (ref 10.0–291.0)

## 2014-04-11 LAB — VITAMIN B12: Vitamin B-12: >1500 pg/mL — ABNORMAL HIGH (ref 211–911)

## 2014-04-12 ENCOUNTER — Encounter: Payer: Self-pay | Admitting: Family Medicine

## 2014-04-12 DIAGNOSIS — F418 Other specified anxiety disorders: Secondary | ICD-10-CM

## 2014-04-12 MED ORDER — CITALOPRAM HYDROBROMIDE 40 MG PO TABS: 40.0000 mg | ORAL_TABLET | Freq: Every day | ORAL | Status: DC

## 2014-04-12 MED ORDER — ALPRAZOLAM 0.25 MG PO TABS: 0.2500 mg | ORAL_TABLET | Freq: Three times a day (TID) | ORAL | Status: DC | PRN

## 2014-04-12 NOTE — Telephone Encounter (Signed)
Increase celexa to 40 mg  Inc # on xanax to 60 Ov in 4 weeks

## 2014-05-25 ENCOUNTER — Telehealth: Payer: Self-pay

## 2014-05-25 MED ORDER — DULOXETINE HCL 30 MG PO CPEP: ORAL_CAPSULE | ORAL | Status: DC

## 2014-05-25 NOTE — Telephone Encounter (Signed)
cymbalta 30 mg 1 po qd x 7 days ,  Then inc to 2 a day  #60   Ov 1 months Cut celexa in 1/2 x 1 week then stop

## 2014-05-25 NOTE — Telephone Encounter (Signed)
Call from the patient and she stated she can not make it through the day without crying and she wants to go back on Cymbalta or something better to help with the depression if her marital issues. she said she is taking the Xanax 3 times a day and Celexa and it is not helping. She is currently crying, denied SI/HI thoughts, she is not alone.  Please advise     KP

## 2014-05-25 NOTE — Telephone Encounter (Signed)
Discussed with patient and she verbalized understanding. Rx has been faxed and she will 1/2 the Celexa for 1 week and then stop.     KP

## 2014-06-09 ENCOUNTER — Emergency Department (HOSPITAL_BASED_OUTPATIENT_CLINIC_OR_DEPARTMENT_OTHER)
Admission: EM | Admit: 2014-06-09 | Discharge: 2014-06-10 | Disposition: A | Discharge status: Home / Self Care | Payer: BC Managed Care – PPO | Attending: Emergency Medicine | Admitting: Emergency Medicine

## 2014-06-09 ENCOUNTER — Encounter (HOSPITAL_BASED_OUTPATIENT_CLINIC_OR_DEPARTMENT_OTHER): Payer: Self-pay | Admitting: Emergency Medicine

## 2014-06-09 DIAGNOSIS — Z792 Long term (current) use of antibiotics: Secondary | ICD-10-CM | POA: Insufficient documentation

## 2014-06-09 DIAGNOSIS — Z8639 Personal history of other endocrine, nutritional and metabolic disease: Secondary | ICD-10-CM | POA: Diagnosis not present

## 2014-06-09 DIAGNOSIS — R51 Headache: Secondary | ICD-10-CM | POA: Insufficient documentation

## 2014-06-09 DIAGNOSIS — Z3202 Encounter for pregnancy test, result negative: Secondary | ICD-10-CM | POA: Diagnosis not present

## 2014-06-09 DIAGNOSIS — N200 Calculus of kidney: Secondary | ICD-10-CM | POA: Insufficient documentation

## 2014-06-09 DIAGNOSIS — F329 Major depressive disorder, single episode, unspecified: Secondary | ICD-10-CM | POA: Insufficient documentation

## 2014-06-09 DIAGNOSIS — Z79899 Other long term (current) drug therapy: Secondary | ICD-10-CM | POA: Diagnosis not present

## 2014-06-09 DIAGNOSIS — R109 Unspecified abdominal pain: Secondary | ICD-10-CM | POA: Diagnosis present

## 2014-06-09 DIAGNOSIS — N12 Tubulo-interstitial nephritis, not specified as acute or chronic: Secondary | ICD-10-CM

## 2014-06-09 NOTE — ED Notes (Signed)
Pt states she started feeling light headed, SOB and can't stop shaking about 30 mins ago after picking her daughter up

## 2014-06-10 LAB — URINALYSIS, ROUTINE W REFLEX MICROSCOPIC
Bilirubin Urine: NEGATIVE
GLUCOSE, UA: NEGATIVE mg/dL
Ketones, ur: NEGATIVE mg/dL
Nitrite: NEGATIVE
Protein, ur: NEGATIVE mg/dL
SPECIFIC GRAVITY, URINE: 1.007 (ref 1.005–1.030)
UROBILINOGEN UA: 1 mg/dL (ref 0.0–1.0)
pH: 7.5 (ref 5.0–8.0)

## 2014-06-10 LAB — URINE MICROSCOPIC-ADD ON

## 2014-06-10 LAB — CBG MONITORING, ED: GLUCOSE-CAPILLARY: 106 mg/dL — AB (ref 70–99)

## 2014-06-10 LAB — PREGNANCY, URINE: Preg Test, Ur: NEGATIVE

## 2014-06-10 MED ORDER — CEFTRIAXONE SODIUM 1 G IJ SOLR
1.0000 g | Freq: Once | INTRAMUSCULAR | Status: AC
  Administered 2014-06-10: 1 g via INTRAMUSCULAR
  Filled 2014-06-10: qty 10

## 2014-06-10 MED ORDER — CEFPODOXIME PROXETIL 100 MG PO TABS: 100.0000 mg | ORAL_TABLET | Freq: Two times a day (BID) | ORAL | Status: DC

## 2014-06-10 MED ORDER — ONDANSETRON HCL 4 MG PO TABS: 4.0000 mg | ORAL_TABLET | Freq: Three times a day (TID) | ORAL | Status: DC | PRN

## 2014-06-10 NOTE — ED Notes (Signed)
Dr. Colin Rhein at [facility].

## 2014-06-10 NOTE — ED Notes (Signed)
Alert, NAD, calm, interactive, steady gait back to room.

## 2014-06-10 NOTE — ED Notes (Addendum)
C/o L abd side "ache", also chills, shaky, urinary frequency, light headed. (denies nvd), took motrin 400mg , last BM today (normal), last at [facility], has a IUD, LMP 2d ago, h/o kidney stones, last stone 8 yrs ago (Dr. Gaynelle Arabian GU), alert, NAD, calm, interactive, skin W&D, no dyspnea noted.

## 2014-06-10 NOTE — ED Provider Notes (Signed)
CSN: [id number]     Arrival date & time 06/09/14  2352 History  This chart was scribed for Tina Freiberg, MD by Chester Holstein, ED Scribe. This patient was seen in room MH03/MH03 and the patient's care was started at 12:04 AM.    Chief Complaint  Patient presents with  . Light headed      The history is provided by the patient. No language interpreter was used.  HPI Comments: Tina Frost is a 44 y.o. female with PMHx of nephrolithiasis, HLD, depression who presents to the Emergency Department complaining of lightheadedness, malaise with sudden onset 30 min PTA. Pt notes associated left sided abdominal pain, acute onset thirst, urgency, and severe chills. Pt takes Xanax and Cymbalta. Pt denies nausea, vomiting, diarrhea, constipation, cough, congestion, sore throat, and dysuria. She has had increased frequency x 2 days, no fever at home.  Timing constant, no exacerbating or alleviating factors.  Past Medical History  Diagnosis Date  . Abdominal pain   . Hyperlipidemia   . Kidney stone   . Depression    Past Surgical History  Procedure Laterality Date  . Colonoscopy  2002  . Appendectomy    . Tubal ligation    . Kidney stone surgery  2008   Family History  Problem Relation Age of Onset  . Cancer Mother     breast  . Cancer Paternal Grandmother     lung  . Hypertension Other   . Hyperlipidemia Other   . Kidney disease Other     kidney stones   History  Substance Use Topics  . Smoking status: Never Smoker   . Smokeless tobacco: Not on file  . Alcohol Use: No   OB History    No data available     Review of Systems  Constitutional: Positive for chills. Negative for fever.  HENT: Negative for congestion and sore throat.   Respiratory: Negative for cough.   Gastrointestinal: Positive for abdominal pain. Negative for nausea, vomiting, diarrhea and constipation.  Endocrine: Positive for polydipsia.  Genitourinary: Positive for urgency. Negative for dysuria.   Neurological: Positive for light-headedness and headaches.  All other systems reviewed and are negative.     Allergies  Sulfa antibiotics  Home Medications   Prior to Admission medications   Medication Sig Start Date End Date Taking? Authorizing Provider  ALPRAZolam (XANAX) 0.25 MG tablet Take 1 tablet (0.25 mg total) by mouth 3 (three) times daily as needed for anxiety. 04/12/14   Rosalita Chessman, DO  amoxicillin (AMOXIL) 500 MG capsule Take 2 capsules (1,000 mg total) by mouth 2 (two) times daily. 02/09/14   Colon Branch, MD  cefpodoxime (VANTIN) 100 MG tablet Take 1 tablet (100 mg total) by mouth 2 (two) times daily. 06/10/14   Tina Freiberg, MD  DULoxetine (CYMBALTA) 30 MG capsule 1 tab for 7 days then increase to 2 daily. 05/25/14   Alferd Apa Lowne, DO  ondansetron (ZOFRAN) 4 MG tablet Take 1 tablet (4 mg total) by mouth every 8 (eight) hours as needed for nausea or vomiting. 06/10/14   Tina Freiberg, MD  pramipexole (MIRAPEX) 0.125 MG tablet 1 po qhs x 4-7 days then increase to 2 po qhs if needed 04/24/13   Alferd Apa Lowne, DO   BP 139/80 mmHg  Pulse 96  Temp(Src) 99.8 F (37.7 C) (Oral)  Ht 5\' 7"  (1.702 m)  Wt 169 lb (76.658 kg)  BMI 26.46 kg/m2  SpO2 99%  LMP 06/09/2014 Physical Exam  Constitutional:  She is oriented to person, place, and time. She appears well-developed and well-nourished.  HENT:  Head: Normocephalic and atraumatic.  Right Ear: External ear normal.  Left Ear: External ear normal.  Eyes: Conjunctivae and EOM are normal. Pupils are equal, round, and reactive to light.  Neck: Normal range of motion. Neck supple.  Cardiovascular: Normal rate, regular rhythm, normal heart sounds and intact distal pulses.   Pulmonary/Chest: Effort normal and breath sounds normal.  Abdominal: Soft. Bowel sounds are normal. There is no tenderness. There is no CVA tenderness.  Musculoskeletal: Normal range of motion.  Neurological: She is alert and oriented to person, place, and time.   Skin: Skin is warm and dry.  Vitals reviewed.   ED Course  Procedures (including critical care time) DIAGNOSTIC STUDIES: Oxygen Saturation is 100% on room air, normal by my interpretation.    COORDINATION OF CARE: 12:10 AM Discussed treatment plan with patient at beside, the patient agrees with the plan and has no further questions at this time.   Labs Review Labs Reviewed  URINALYSIS, ROUTINE W REFLEX MICROSCOPIC - Abnormal; Notable for the following:    APPearance CLOUDY (*)    Hgb urine dipstick LARGE (*)    Leukocytes, UA LARGE (*)    All other components within normal limits  URINE MICROSCOPIC-ADD ON - Abnormal; Notable for the following:    Squamous Epithelial / LPF MANY (*)    Bacteria, UA MANY (*)    All other components within normal limits  CBG MONITORING, ED - Abnormal; Notable for the following:    Glucose-Capillary 106 (*)    All other components within normal limits  URINE CULTURE  PREGNANCY, URINE    Imaging Review No results found.   EKG Interpretation None     Emergency Focused Ultrasound Exam Limited retroperitoneal ultrasound of kidneys  Performed and interpreted by Dr. Colin Rhein Indication: flank pain Focused abdominal ultrasound with both kidneys imaged in transverse and longitudinal planes in real-time. Interpretation: No hydronephrosis visualized.   Images archived electronically  CPT Code: [phone] (limited retroperitoneal)  MDM   Final diagnoses:  Pyelonephritis    44 y.o. female with pertinent PMH of nephrolithiasis presents with malaise, lightheadedness, chills shortly PTA.  Physical exam on arrival as above.  No pain throughout entirety of symptoms.  Wu with UTI, pt then endorsed mild L back aching.  Suspect likely early pyelonephritis.  [place] at bedside without obvious hydro, but pt given strict return precautions for pain in case of occult nephrolithiasis, but with history and exam, lack of pain, consider pyelo more likely than  obstructed stone.  DC home to fu with PCP.    I have reviewed all laboratory and imaging studies if ordered as above  1. Pyelonephritis           Tina Freiberg, MD 06/10/14 [phone]

## 2014-06-10 NOTE — Discharge Instructions (Signed)
Pyelonephritis, Adult °Pyelonephritis is a kidney infection. In general, there are 2 main types of pyelonephritis: °· Infections that come on quickly without any warning (acute pyelonephritis). °· Infections that persist for a long period of time (chronic pyelonephritis). °CAUSES  °Two main causes of pyelonephritis are: °· Bacteria traveling from the bladder to the kidney. This is a problem especially in pregnant women. The urine in the bladder can become filled with bacteria from multiple causes, including: °¨ Inflammation of the prostate gland (prostatitis). °¨ Sexual intercourse in females. °¨ Bladder infection (cystitis). °· Bacteria traveling from the bloodstream to the tissue part of the kidney. °Problems that may increase your risk of getting a kidney infection include: °· Diabetes. °· Kidney stones or bladder stones. °· Cancer. °· Catheters placed in the bladder. °· Other abnormalities of the kidney or ureter. °SYMPTOMS  °· Abdominal pain. °· Pain in the side or flank area. °· Fever. °· Chills. °· Upset stomach. °· Blood in the urine (dark urine). °· Frequent urination. °· Strong or persistent urge to urinate. °· Burning or stinging when urinating. °DIAGNOSIS  °Your caregiver may diagnose your kidney infection based on your symptoms. A urine sample may also be taken. °TREATMENT  °In general, treatment depends on how severe the infection is.  °· If the infection is mild and caught early, your caregiver may treat you with oral antibiotics and send you home. °· If the infection is more severe, the bacteria may have gotten into the bloodstream. This will require intravenous (IV) antibiotics and a hospital stay. Symptoms may include: °¨ High fever. °¨ Severe flank pain. °¨ Shaking chills. °· Even after a hospital stay, your caregiver may require you to be on oral antibiotics for a period of time. °· Other treatments may be required depending upon the cause of the infection. °HOME CARE INSTRUCTIONS  °· Take your  antibiotics as directed. Finish them even if you start to feel better. °· Make an appointment to have your urine checked to make sure the infection is gone. °· Drink enough fluids to keep your urine clear or pale yellow. °· Take medicines for the bladder if you have urgency and frequency of urination as directed by your caregiver. °SEEK IMMEDIATE MEDICAL CARE IF:  °· You have a fever or persistent symptoms for more than 2-3 days. °· You have a fever and your symptoms suddenly get worse. °· You are unable to take your antibiotics or fluids. °· You develop shaking chills. °· You experience extreme weakness or fainting. °· There is no improvement after 2 days of treatment. °MAKE SURE YOU: °· Understand these instructions. °· Will watch your condition. °· Will get help right away if you are not doing well or get worse. °Document Released: 02/09/2005 Document Revised: 08/11/2011 Document Reviewed: 07/16/2010 °ExitCare® Patient Information ©2015 [employer]. This information is not intended to replace advice given to you by your health care provider. Make sure you discuss any questions you have with your health care provider. ° °

## 2014-06-12 LAB — URINE CULTURE: Colony Count: >=100000

## 2014-06-13 ENCOUNTER — Telehealth (HOSPITAL_COMMUNITY): Payer: Self-pay

## 2014-06-13 NOTE — Telephone Encounter (Signed)
Post ED Visit - Positive Culture Follow-up  Culture report reviewed by antimicrobial stewardship pharmacist: []  Wes [place], Pharm.D., BCPS []  Heide Guile, Pharm.D., BCPS []  Alycia Rossetti, Pharm.D., BCPS []  Menan, Pharm.D., BCPS, AAHIVP []  Legrand Como, Pharm.D., BCPS, AAHIVP []  Isac Sarna, Pharm.D., BCPS nick Positive urine culture Treated with cefpodoxime, organism sensitive to the same and no further patient follow-up is required at this time.  Ileene Musa 06/13/2014, 3:37 PM

## 2014-06-26 ENCOUNTER — Encounter: Payer: Self-pay | Admitting: Family Medicine

## 2014-06-26 NOTE — Telephone Encounter (Signed)
The only thing I know to do is try something different.    Dr Carlean Jews

## 2014-07-02 ENCOUNTER — Telehealth: Payer: Self-pay | Admitting: Family Medicine

## 2014-07-02 MED ORDER — DULOXETINE HCL 30 MG PO CPEP: ORAL_CAPSULE | ORAL | Status: DC

## 2014-07-02 NOTE — Telephone Encounter (Signed)
Caller name: Laporche Relation to pt: self Call back number: [phone] Pharmacy: [facility]  Reason for call:   Patient scheduled appointment for med refill for 07/05/14 and states that she ran out of cymbalta today and is requesting a refill to last her to her appointment

## 2014-07-02 NOTE — Telephone Encounter (Signed)
Rx faxed.    KP 

## 2014-07-05 ENCOUNTER — Ambulatory Visit (INDEPENDENT_AMBULATORY_CARE_PROVIDER_SITE_OTHER): Payer: BC Managed Care – PPO | Admitting: Family Medicine

## 2014-07-05 ENCOUNTER — Encounter: Payer: Self-pay | Admitting: Family Medicine

## 2014-07-05 VITALS — BP 120/80 | HR 78 | Temp 98.4°F | Wt 166.3 lb

## 2014-07-05 DIAGNOSIS — F411 Generalized anxiety disorder: Secondary | ICD-10-CM | POA: Diagnosis not present

## 2014-07-05 MED ORDER — DULOXETINE HCL 60 MG PO CPEP: 60.0000 mg | ORAL_CAPSULE | Freq: Every day | ORAL | Status: DC

## 2014-07-05 MED ORDER — CLONAZEPAM 0.5 MG PO TABS: 0.5000 mg | ORAL_TABLET | Freq: Two times a day (BID) | ORAL | Status: DC | PRN

## 2014-07-05 NOTE — Progress Notes (Signed)
Patient ID: Tina Frost, female    DOB: [birth year]  Age: 44 y.o. MRN: [id number]    Subjective:  Subjective HPI MAZAL EBEY presents for f/u anxiety  Review of Systems  Constitutional: Negative for activity change, appetite change, fatigue and unexpected weight change.  Respiratory: Negative for cough and shortness of breath.   Cardiovascular: Negative for chest pain and palpitations.  Psychiatric/Behavioral: Negative for behavioral problems and dysphoric mood. The patient is not nervous/anxious.     History Past Medical History  Diagnosis Date  . Abdominal pain   . Hyperlipidemia   . Kidney stone   . Depression     She has past surgical history that includes Colonoscopy (2002); Appendectomy; Tubal ligation; and Kidney stone surgery (2008).   Her family history includes Cancer in her mother and paternal grandmother; Hyperlipidemia in her other; Hypertension in her other; Kidney disease in her other.She reports that she has never smoked. She does not have any smokeless tobacco history on file. She reports that she does not drink alcohol or use illicit drugs.  No current outpatient prescriptions on file prior to visit.   No current facility-administered medications on file prior to visit.     Objective:  Objective Physical Exam  Constitutional: She is oriented to person, place, and time. She appears well-developed and well-nourished. No distress.  HENT:  Head: Normocephalic and atraumatic.  Right Ear: Tympanic membrane normal.  Left Ear: Tympanic membrane normal.  Mouth/Throat: Uvula is midline and mucous membranes are normal.  Eyes: Conjunctivae and EOM are normal. Pupils are equal, round, and reactive to light.  Neck: Normal range of motion. Neck supple. No JVD present. Carotid bruit is not present. No thyromegaly present.  Cardiovascular: Normal rate, regular rhythm and normal heart sounds.   No murmur heard. Pulmonary/Chest: Effort normal and breath sounds  normal. No respiratory distress. She has no wheezes. She has no rales. She exhibits no tenderness.  Musculoskeletal: She exhibits no edema.  Lymphadenopathy:    She has no cervical adenopathy.  Neurological: She is alert and oriented to person, place, and time.  Psychiatric: She has a normal mood and affect. Her behavior is normal. Judgment and thought content normal.   BP 120/80 mmHg  Pulse 78  Temp(Src) 98.4 F (36.9 C) (Oral)  Wt 166 lb 5.4 oz (75.451 kg)  SpO2 99%  LMP 06/05/2014 (Approximate) Wt Readings from Last 3 Encounters:  07/05/14 166 lb 5.4 oz (75.451 kg)  06/09/14 169 lb (76.658 kg)  02/09/14 180 lb (81.647 kg)     Lab Results  Component Value Date   WBC 5.2 04/24/2013   HGB 11.5* 04/24/2013   HCT 36.6 04/24/2013   PLT 305.0 04/24/2013   GLUCOSE 76 04/24/2013   CHOL 187 10/12/2011   TRIG 69.0 10/12/2011   HDL 57.90 10/12/2011   LDLCALC 115* 10/12/2011   ALT 17 04/24/2013   AST 22 04/24/2013   NA 138 04/24/2013   K 3.9 04/24/2013   CL 106 04/24/2013   CREATININE 0.5 04/24/2013   BUN 5* 04/24/2013   CO2 27 04/24/2013   TSH 0.37 10/12/2011    No results found.   Assessment & Plan:  Plan I have discontinued Ms. Schutter's pramipexole, amoxicillin, ALPRAZolam, cefpodoxime, ondansetron, and DULoxetine. I am also having her start on clonazePAM and DULoxetine.  Meds ordered this encounter  Medications  . clonazePAM (KLONOPIN) 0.5 MG tablet    Sig: Take 1 tablet (0.5 mg total) by mouth 2 (two) times daily as needed  for anxiety.    Dispense:  60 tablet    Refill:  1  . DULoxetine (CYMBALTA) 60 MG capsule    Sig: Take 1 capsule (60 mg total) by mouth daily.    Dispense:  30 capsule    Refill:  5    Problem List Items Addressed This Visit    None    Visit Diagnoses    Generalized anxiety disorder    -  Primary    Relevant Medications    clonazePAM (KLONOPIN) 0.5 MG tablet    DULoxetine (CYMBALTA) 60 MG capsule    Generalized anxiety disorder   (Chronic)  (Active)      Relevant Medications    clonazePAM (KLONOPIN) 0.5 MG tablet    DULoxetine (CYMBALTA) 60 MG capsule       Follow-up: Return in about 6 months (around 01/05/2015), or if symptoms worsen or fail to improve, for cpe.  Garnet Koyanagi, DO

## 2014-07-05 NOTE — Patient Instructions (Signed)
Generalized Anxiety Disorder Generalized anxiety disorder (GAD) is a mental disorder. It interferes with life functions, including relationships, work, and school. GAD is different from normal anxiety, which everyone experiences at some point in their lives in response to specific life events and activities. Normal anxiety actually helps us prepare for and get through these life events and activities. Normal anxiety goes away after the event or activity is over.  GAD causes anxiety that is not necessarily related to specific events or activities. It also causes excess anxiety in proportion to specific events or activities. The anxiety associated with GAD is also difficult to control. GAD can vary from mild to severe. People with severe GAD can have intense waves of anxiety with physical symptoms (panic attacks).  SYMPTOMS The anxiety and worry associated with GAD are difficult to control. This anxiety and worry are related to many life events and activities and also occur more days than not for 6 months or longer. People with GAD also have three or more of the following symptoms (one or more in children):  Restlessness.   Fatigue.  Difficulty concentrating.   Irritability.  Muscle tension.  Difficulty sleeping or unsatisfying sleep. DIAGNOSIS GAD is diagnosed through an assessment by your health care provider. Your health care provider will ask you questions aboutyour mood,physical symptoms, and events in your life. Your health care provider may ask you about your medical history and use of alcohol or drugs, including prescription medicines. Your health care provider may also do a physical exam and blood tests. Certain medical conditions and the use of certain substances can cause symptoms similar to those associated with GAD. Your health care provider may refer you to a mental health specialist for further evaluation. TREATMENT The following therapies are usually used to treat GAD:    Medication. Antidepressant medication usually is prescribed for long-term daily control. Antianxiety medicines may be added in severe cases, especially when panic attacks occur.   Talk therapy (psychotherapy). Certain types of talk therapy can be helpful in treating GAD by providing support, education, and guidance. A form of talk therapy called cognitive behavioral therapy can teach you healthy ways to think about and react to daily life events and activities.  Stress managementtechniques. These include yoga, meditation, and exercise and can be very helpful when they are practiced regularly. A mental health specialist can help determine which treatment is best for you. Some people see improvement with one therapy. However, other people require a combination of therapies. Document Released: 06/06/2012 Document Revised: 06/26/2013 Document Reviewed: 06/06/2012 ExitCare Patient Information 2015 [employer]. This information is not intended to replace advice given to you by your health care provider. Make sure you discuss any questions you have with your health care provider.  

## 2014-07-05 NOTE — Progress Notes (Signed)
Pre visit review using our clinic review tool, if applicable. No additional management support is needed unless otherwise documented below in the visit note. 

## 2014-08-03 ENCOUNTER — Telehealth: Payer: Self-pay | Admitting: Family Medicine

## 2014-08-03 DIAGNOSIS — F329 Major depressive disorder, single episode, unspecified: Secondary | ICD-10-CM

## 2014-08-03 DIAGNOSIS — F32A Depression, unspecified: Secondary | ICD-10-CM

## 2014-08-03 MED ORDER — ALPRAZOLAM 0.5 MG PO TABS: 0.5000 mg | ORAL_TABLET | Freq: Two times a day (BID) | ORAL | Status: DC | PRN

## 2014-08-03 NOTE — Telephone Encounter (Signed)
Patient aware. She wanted to see someone in our office, [facility] referral placed.     KP

## 2014-08-03 NOTE — Telephone Encounter (Signed)
Spoke with patient and she stated that she is taking the Klonopin once a day, and it is not helping her, she wanted to go back to the Xanax but at a higher dose. Please advise     KP

## 2014-08-03 NOTE — Telephone Encounter (Signed)
Xanax 0.5 mg  Bid prn #30 but I want her to see a psych--- I will only rx until psych appointment

## 2014-08-03 NOTE — Telephone Encounter (Signed)
Relation to pt: self  Call back number: [phone]  Pharmacy: [facility], [place] - [address]  Reason for call:  Pt requesting a refill ALPRAZolam (XANAX) 0.25 MG tablet and DULoxetine (CYMBALTA) 60 MG capsule

## 2014-08-10 NOTE — Telephone Encounter (Signed)
Caller name: Arletha Marschke Relationship to patient: self Can be reached: [phone] Pharmacy:   Reason for call: Pt states that the [facility] in our office are only therapists and cannot prescribe meds which is why she thought she was being referred to them. Please call pt to advise if this is fine and Dr. Etter Sjogren will prescribe if she is seeing therapist or if she needs different referral.

## 2014-08-10 NOTE — Telephone Encounter (Signed)
Patient wanted to know if Dr.Lowne would prescribed med's if recommended by the counselors in office. I advised yes and if it is something that is beyond her scope she would ref the patient to a Psychiatrist. The patient verbalized understanding. She will call back next week if she had not heard from them.    KP

## 2014-09-03 ENCOUNTER — Telehealth: Payer: Self-pay

## 2014-09-03 MED ORDER — ALPRAZOLAM 0.5 MG PO TABS: 0.5000 mg | ORAL_TABLET | Freq: Two times a day (BID) | ORAL | Status: DC | PRN

## 2014-09-03 NOTE — Telephone Encounter (Signed)
Patient aware Rx ready for pick up in the office... KP 

## 2014-09-03 NOTE — Telephone Encounter (Signed)
Refill x1 Needs uds  

## 2014-09-03 NOTE — Telephone Encounter (Signed)
Last seen 07/05/14 and filled 08/03/14 #30 No UDS   Please advise     KP

## 2014-12-21 ENCOUNTER — Telehealth: Payer: Self-pay | Admitting: *Deleted

## 2014-12-21 MED ORDER — ALPRAZOLAM 0.5 MG PO TABS: 0.5000 mg | ORAL_TABLET | Freq: Two times a day (BID) | ORAL | Status: DC | PRN

## 2014-12-21 NOTE — Telephone Encounter (Addendum)
rx request - xanax 0.5mg   Last OV- 07/05/14 Last refilled - 09/03/14 # 30 / 0 rf  UDS - none.

## 2014-12-21 NOTE — Telephone Encounter (Signed)
Pt notified. rx rdy for pick up at front desk.  

## 2014-12-21 NOTE — Telephone Encounter (Signed)
Refill x1 

## 2015-01-11 ENCOUNTER — Telehealth: Payer: Self-pay | Admitting: Family Medicine

## 2015-01-11 DIAGNOSIS — K429 Umbilical hernia without obstruction or gangrene: Secondary | ICD-10-CM

## 2015-01-11 NOTE — Telephone Encounter (Signed)
Ref placed.      KP 

## 2015-01-11 NOTE — Telephone Encounter (Signed)
Relation to [id number] Call back number: [phone]    Reason for call:  Patient requesting a referral to [facility] [place] [facility] due to removing an embilica hernia

## 2015-01-11 NOTE — Telephone Encounter (Signed)
Ok to refer for umbilical hernia

## 2015-01-11 NOTE — Telephone Encounter (Signed)
Please advise      KP 

## 2015-01-24 HISTORY — PX: HERNIA REPAIR: SHX51

## 2015-02-08 ENCOUNTER — Encounter (HOSPITAL_COMMUNITY): Payer: Self-pay | Admitting: *Deleted

## 2015-02-08 ENCOUNTER — Ambulatory Visit: Payer: Self-pay | Admitting: General Surgery

## 2015-02-11 ENCOUNTER — Ambulatory Visit (HOSPITAL_COMMUNITY): Payer: BC Managed Care – PPO | Admitting: Anesthesiology

## 2015-02-11 ENCOUNTER — Encounter (HOSPITAL_COMMUNITY)
Admission: RE | Disposition: A | Discharge status: Home / Self Care | Payer: Self-pay | Source: Ambulatory Visit | Attending: General Surgery

## 2015-02-11 ENCOUNTER — Ambulatory Visit (HOSPITAL_COMMUNITY)
Admission: RE | Admit: 2015-02-11 | Discharge: 2015-02-11 | Disposition: A | Discharge status: Home / Self Care | Payer: BC Managed Care – PPO | Source: Ambulatory Visit | Attending: General Surgery | Admitting: General Surgery

## 2015-02-11 ENCOUNTER — Encounter (HOSPITAL_COMMUNITY): Payer: Self-pay

## 2015-02-11 DIAGNOSIS — F329 Major depressive disorder, single episode, unspecified: Secondary | ICD-10-CM | POA: Diagnosis not present

## 2015-02-11 DIAGNOSIS — F419 Anxiety disorder, unspecified: Secondary | ICD-10-CM | POA: Diagnosis not present

## 2015-02-11 DIAGNOSIS — K429 Umbilical hernia without obstruction or gangrene: Secondary | ICD-10-CM | POA: Insufficient documentation

## 2015-02-11 HISTORY — DX: Anxiety disorder, unspecified: F41.9

## 2015-02-11 HISTORY — PX: INSERTION OF MESH: SHX5868

## 2015-02-11 HISTORY — PX: UMBILICAL HERNIA REPAIR: SHX196

## 2015-02-11 HISTORY — DX: Personal history of urinary calculi: Z87.442

## 2015-02-11 LAB — CBC
HCT: 45 % (ref 36.0–46.0)
HEMOGLOBIN: 15.7 g/dL — AB (ref 12.0–15.0)
MCH: 32.4 pg (ref 26.0–34.0)
MCHC: 34.9 g/dL (ref 30.0–36.0)
MCV: 92.8 fL (ref 78.0–100.0)
PLATELETS: 217 10*3/uL (ref 150–400)
RBC: 4.85 MIL/uL (ref 3.87–5.11)
RDW: 12.8 % (ref 11.5–15.5)
WBC: 5.9 10*3/uL (ref 4.0–10.5)

## 2015-02-11 LAB — HCG, SERUM, QUALITATIVE: Preg, Serum: NEGATIVE

## 2015-02-11 SURGERY — REPAIR, HERNIA, UMBILICAL, LAPAROSCOPIC
Anesthesia: General | Site: Abdomen

## 2015-02-11 MED ORDER — HYDROMORPHONE HCL 1 MG/ML IJ SOLN
INTRAMUSCULAR | Status: AC
  Administered 2015-02-11: 0.5 mg via INTRAVENOUS
  Filled 2015-02-11: qty 1

## 2015-02-11 MED ORDER — ACETAMINOPHEN 325 MG PO TABS
975.0000 mg | ORAL_TABLET | Freq: Once | ORAL | Status: AC
  Administered 2015-02-11: 975 mg via ORAL

## 2015-02-11 MED ORDER — BUPIVACAINE HCL 0.25 % IJ SOLN
INTRAMUSCULAR | Status: DC | PRN
  Administered 2015-02-11: 30 mL

## 2015-02-11 MED ORDER — GLYCOPYRROLATE 0.2 MG/ML IJ SOLN
INTRAMUSCULAR | Status: DC | PRN
  Administered 2015-02-11: .6 mg via INTRAVENOUS

## 2015-02-11 MED ORDER — FENTANYL CITRATE (PF) 100 MCG/2ML IJ SOLN
INTRAMUSCULAR | Status: DC | PRN
  Administered 2015-02-11: 50 ug via INTRAVENOUS
  Administered 2015-02-11: 100 ug via INTRAVENOUS
  Administered 2015-02-11: 50 ug via INTRAVENOUS

## 2015-02-11 MED ORDER — ONDANSETRON HCL 4 MG/2ML IJ SOLN
INTRAMUSCULAR | Status: AC
  Filled 2015-02-11: qty 2

## 2015-02-11 MED ORDER — LACTATED RINGERS IV SOLN
INTRAVENOUS | Status: DC | PRN
  Administered 2015-02-11: 11:00:00 via INTRAVENOUS

## 2015-02-11 MED ORDER — LIDOCAINE HCL (CARDIAC) 20 MG/ML IV SOLN
INTRAVENOUS | Status: DC | PRN
  Administered 2015-02-11: 50 mg via INTRAVENOUS

## 2015-02-11 MED ORDER — LIDOCAINE HCL (CARDIAC) 20 MG/ML IV SOLN
INTRAVENOUS | Status: AC
  Filled 2015-02-11: qty 5

## 2015-02-11 MED ORDER — NEOSTIGMINE METHYLSULFATE 10 MG/10ML IV SOLN
INTRAVENOUS | Status: DC | PRN
  Administered 2015-02-11: 3 mg via INTRAVENOUS

## 2015-02-11 MED ORDER — MORPHINE SULFATE (PF) 2 MG/ML IV SOLN
INTRAVENOUS | Status: AC
  Administered 2015-02-11: 2 mg via INTRAVENOUS
  Filled 2015-02-11: qty 1

## 2015-02-11 MED ORDER — LACTATED RINGERS IV SOLN
INTRAVENOUS | Status: DC
  Administered 2015-02-11: 09:00:00 via INTRAVENOUS

## 2015-02-11 MED ORDER — ONDANSETRON HCL 4 MG/2ML IJ SOLN: 4.0000 mg | Freq: Once | INTRAMUSCULAR | Status: DC | PRN

## 2015-02-11 MED ORDER — ROCURONIUM BROMIDE 100 MG/10ML IV SOLN
INTRAVENOUS | Status: DC | PRN
  Administered 2015-02-11: 50 mg via INTRAVENOUS

## 2015-02-11 MED ORDER — SODIUM CHLORIDE 0.9 % IR SOLN
Status: DC | PRN
  Administered 2015-02-11: 1000 mL

## 2015-02-11 MED ORDER — ACETAMINOPHEN 325 MG PO TABS
ORAL_TABLET | ORAL | Status: AC
  Filled 2015-02-11: qty 3

## 2015-02-11 MED ORDER — PROPOFOL 10 MG/ML IV BOLUS
INTRAVENOUS | Status: AC
  Filled 2015-02-11: qty 20

## 2015-02-11 MED ORDER — OXYCODONE HCL 5 MG PO TABS
ORAL_TABLET | ORAL | Status: AC
  Filled 2015-02-11: qty 2

## 2015-02-11 MED ORDER — MIDAZOLAM HCL 5 MG/5ML IJ SOLN
INTRAMUSCULAR | Status: DC | PRN
  Administered 2015-02-11: 2 mg via INTRAVENOUS

## 2015-02-11 MED ORDER — OXYCODONE HCL 5 MG PO TABS
5.0000 mg | ORAL_TABLET | ORAL | Status: DC | PRN
  Administered 2015-02-11: 10 mg via ORAL

## 2015-02-11 MED ORDER — CHLORHEXIDINE GLUCONATE 4 % EX LIQD: 1.0000 "application " | Freq: Once | CUTANEOUS | Status: DC

## 2015-02-11 MED ORDER — MIDAZOLAM HCL 2 MG/2ML IJ SOLN
0.5000 mg | Freq: Once | INTRAMUSCULAR | Status: AC
  Administered 2015-02-11: 2 mg via INTRAVENOUS

## 2015-02-11 MED ORDER — KETOROLAC TROMETHAMINE 30 MG/ML IJ SOLN
30.0000 mg | Freq: Once | INTRAMUSCULAR | Status: AC
  Administered 2015-02-11: 30 mg via INTRAVENOUS

## 2015-02-11 MED ORDER — FENTANYL CITRATE (PF) 250 MCG/5ML IJ SOLN
INTRAMUSCULAR | Status: AC
  Filled 2015-02-11: qty 5

## 2015-02-11 MED ORDER — ACETAMINOPHEN 325 MG PO TABS: 650.0000 mg | ORAL_TABLET | ORAL | Status: DC | PRN

## 2015-02-11 MED ORDER — KETOROLAC TROMETHAMINE 30 MG/ML IJ SOLN
INTRAMUSCULAR | Status: AC
  Filled 2015-02-11: qty 1

## 2015-02-11 MED ORDER — CEFAZOLIN SODIUM-DEXTROSE 2-3 GM-% IV SOLR
INTRAVENOUS | Status: AC
  Filled 2015-02-11: qty 50

## 2015-02-11 MED ORDER — SODIUM CHLORIDE 0.9 % IV SOLN: 250.0000 mL | INTRAVENOUS | Status: DC | PRN

## 2015-02-11 MED ORDER — ROCURONIUM BROMIDE 50 MG/5ML IV SOLN
INTRAVENOUS | Status: AC
  Filled 2015-02-11: qty 1

## 2015-02-11 MED ORDER — HYDROMORPHONE HCL 1 MG/ML IJ SOLN
0.2500 mg | INTRAMUSCULAR | Status: DC | PRN
  Administered 2015-02-11 (×4): 0.5 mg via INTRAVENOUS

## 2015-02-11 MED ORDER — MORPHINE SULFATE (PF) 2 MG/ML IV SOLN
2.0000 mg | INTRAVENOUS | Status: DC | PRN
  Administered 2015-02-11 (×2): 2 mg via INTRAVENOUS

## 2015-02-11 MED ORDER — CEFAZOLIN SODIUM-DEXTROSE 2-3 GM-% IV SOLR
2.0000 g | INTRAVENOUS | Status: AC
  Administered 2015-02-11: 2 g via INTRAVENOUS

## 2015-02-11 MED ORDER — SODIUM CHLORIDE 0.9 % IJ SOLN: 3.0000 mL | INTRAMUSCULAR | Status: DC | PRN

## 2015-02-11 MED ORDER — METHOCARBAMOL 500 MG PO TABS
500.0000 mg | ORAL_TABLET | Freq: Once | ORAL | Status: AC
  Administered 2015-02-11: 500 mg via ORAL

## 2015-02-11 MED ORDER — SODIUM CHLORIDE 0.9 % IJ SOLN: 3.0000 mL | Freq: Two times a day (BID) | INTRAMUSCULAR | Status: DC

## 2015-02-11 MED ORDER — ONDANSETRON HCL 4 MG/2ML IJ SOLN
INTRAMUSCULAR | Status: DC | PRN
  Administered 2015-02-11: 4 mg via INTRAVENOUS

## 2015-02-11 MED ORDER — MEPERIDINE HCL 25 MG/ML IJ SOLN: 6.2500 mg | INTRAMUSCULAR | Status: DC | PRN

## 2015-02-11 MED ORDER — MIDAZOLAM HCL 2 MG/2ML IJ SOLN
INTRAMUSCULAR | Status: AC
Start: 2015-02-11 — End: 2015-02-11
  Administered 2015-02-11: 2 mg via INTRAVENOUS
  Filled 2015-02-11: qty 2

## 2015-02-11 MED ORDER — OXYCODONE-ACETAMINOPHEN 5-325 MG PO TABS: 1.0000 | ORAL_TABLET | ORAL | Status: DC | PRN

## 2015-02-11 MED ORDER — METHOCARBAMOL 500 MG PO TABS
ORAL_TABLET | ORAL | Status: AC
  Administered 2015-02-11: 500 mg via ORAL
  Filled 2015-02-11: qty 1

## 2015-02-11 MED ORDER — PROPOFOL 10 MG/ML IV BOLUS
INTRAVENOUS | Status: DC | PRN
  Administered 2015-02-11: 150 mg via INTRAVENOUS

## 2015-02-11 MED ORDER — ACETAMINOPHEN 650 MG RE SUPP: 650.0000 mg | RECTAL | Status: DC | PRN

## 2015-02-11 MED ORDER — MORPHINE SULFATE (PF) 2 MG/ML IV SOLN
INTRAVENOUS | Status: AC
  Filled 2015-02-11: qty 1

## 2015-02-11 MED ORDER — MIDAZOLAM HCL 2 MG/2ML IJ SOLN
INTRAMUSCULAR | Status: AC
  Filled 2015-02-11: qty 2

## 2015-02-11 MED ORDER — BUPIVACAINE HCL (PF) 0.25 % IJ SOLN
INTRAMUSCULAR | Status: AC
  Filled 2015-02-11: qty 30

## 2015-02-11 SURGICAL SUPPLY — 51 items
APPLIER CLIP LOGIC TI 5 (MISCELLANEOUS) IMPLANT
APPLIER CLIP ROT 10 11.4 M/L (STAPLE)
BENZOIN TINCTURE PRP APPL 2/3 (GAUZE/BANDAGES/DRESSINGS) ×2 IMPLANT
BLADE SURG ROTATE 9660 (MISCELLANEOUS) IMPLANT
CANISTER SUCTION 2500CC (MISCELLANEOUS) IMPLANT
CHLORAPREP W/TINT 26ML (MISCELLANEOUS) ×2 IMPLANT
CLIP APPLIE ROT 10 11.4 M/L (STAPLE) IMPLANT
CLSR STERI-STRIP ANTIMIC 1/2X4 (GAUZE/BANDAGES/DRESSINGS) ×2 IMPLANT
COVER SURGICAL LIGHT HANDLE (MISCELLANEOUS) ×2 IMPLANT
DEVICE SECURE STRAP 25 ABSORB (INSTRUMENTS) ×2 IMPLANT
DEVICE TROCAR PUNCTURE CLOSURE (ENDOMECHANICALS) ×2 IMPLANT
DRAPE LAPAROSCOPIC ABDOMINAL (DRAPES) ×2 IMPLANT
ELECT REM PT RETURN 9FT ADLT (ELECTROSURGICAL) ×2
ELECTRODE REM PT RTRN 9FT ADLT (ELECTROSURGICAL) ×1 IMPLANT
GAUZE SPONGE 2X2 8PLY STRL LF (GAUZE/BANDAGES/DRESSINGS) ×1 IMPLANT
GAUZE SPONGE 4X4 12PLY STRL (GAUZE/BANDAGES/DRESSINGS) ×2 IMPLANT
GLOVE BIO SURGEON STRL SZ7 (GLOVE) ×2 IMPLANT
GLOVE BIO SURGEON STRL SZ7.5 (GLOVE) ×2 IMPLANT
GLOVE BIOGEL PI IND STRL 7.0 (GLOVE) ×2 IMPLANT
GLOVE BIOGEL PI INDICATOR 7.0 (GLOVE) ×2
GLOVE SURG SS PI 6.5 STRL IVOR (GLOVE) ×2 IMPLANT
GOWN STRL REUS W/ TWL LRG LVL3 (GOWN DISPOSABLE) ×2 IMPLANT
GOWN STRL REUS W/ TWL XL LVL3 (GOWN DISPOSABLE) ×1 IMPLANT
GOWN STRL REUS W/TWL LRG LVL3 (GOWN DISPOSABLE) ×2
GOWN STRL REUS W/TWL XL LVL3 (GOWN DISPOSABLE) ×1
KIT BASIN OR (CUSTOM PROCEDURE TRAY) ×2 IMPLANT
KIT ROOM TURNOVER OR (KITS) ×2 IMPLANT
MARKER SKIN DUAL TIP RULER LAB (MISCELLANEOUS) ×2 IMPLANT
MESH VENTRALIGHT ST 4.5IN (Mesh General) ×2 IMPLANT
NEEDLE INSUFFLATION 14GA 120MM (NEEDLE) ×2 IMPLANT
NEEDLE SPNL 22GX3.5 QUINCKE BK (NEEDLE) ×2 IMPLANT
NS IRRIG 1000ML POUR BTL (IV SOLUTION) ×2 IMPLANT
PAD ARMBOARD 7.5X6 YLW CONV (MISCELLANEOUS) ×4 IMPLANT
SCISSORS LAP 5X35 DISP (ENDOMECHANICALS) ×2 IMPLANT
SET IRRIG TUBING LAPAROSCOPIC (IRRIGATION / IRRIGATOR) IMPLANT
SLEEVE ENDOPATH XCEL 5M (ENDOMECHANICALS) ×2 IMPLANT
SPONGE GAUZE 2X2 STER 10/PKG (GAUZE/BANDAGES/DRESSINGS) ×1
SPONGE LAP 18X18 X RAY DECT (DISPOSABLE) ×2 IMPLANT
STRIP CLOSURE SKIN 1/2X4 (GAUZE/BANDAGES/DRESSINGS) ×2 IMPLANT
SUT CHROMIC 2 0 SH (SUTURE) ×2 IMPLANT
SUT MNCRL AB 4-0 PS2 18 (SUTURE) ×2 IMPLANT
SUT PROLENE 2 0 KS (SUTURE) IMPLANT
TAPE CLOTH SOFT 2X10 (GAUZE/BANDAGES/DRESSINGS) ×2 IMPLANT
TOWEL OR 17X24 6PK STRL BLUE (TOWEL DISPOSABLE) ×2 IMPLANT
TOWEL OR 17X26 10 PK STRL BLUE (TOWEL DISPOSABLE) ×2 IMPLANT
TRAY FOLEY CATH 14FR (SET/KITS/TRAYS/PACK) IMPLANT
TRAY LAPAROSCOPIC MC (CUSTOM PROCEDURE TRAY) ×2 IMPLANT
TROCAR XCEL BLUNT TIP 100MML (ENDOMECHANICALS) IMPLANT
TROCAR XCEL NON-BLD 11X100MML (ENDOMECHANICALS) IMPLANT
TROCAR XCEL NON-BLD 5MMX100MML (ENDOMECHANICALS) ×2 IMPLANT
TUBING INSUFFLATION (TUBING) ×2 IMPLANT

## 2015-02-11 NOTE — H&P (Signed)
History of Present Illness Tina Ok MD; 01/31/2015 9:16 AM) The patient is a 44 year old female who presents with an umbilical hernia. 44 year old female who is referred by Dr. Garnet Koyanagi for evaluation of an umbilical hernia. The patient states this hernia is been here for approximate years after the birth of her last child. She states that she does have some discomfort to the area. She states at its bigger since she originally noticed it.  She's had no signs or symptoms of incarceration or strangulation.  Patient has had a history of lap appendectomy, left endoscopic surgery for kidney stones, and tubal ligation.   Other Problems Elbert Ewings, CMA; 01/31/2015 9:06 AM) Anxiety Disorder Hemorrhoids Hypercholesterolemia Kidney Stone Umbilical Hernia Repair  Past Surgical History Elbert Ewings, CMA; 01/31/2015 9:06 AM) Appendectomy Oral Surgery  Diagnostic Studies History Elbert Ewings, CMA; 01/31/2015 9:06 AM) Colonoscopy 5-10 years ago Mammogram within last year Pap Smear 1-5 years ago  Social History Elbert Ewings, CMA; 01/31/2015 9:06 AM) Alcohol use Occasional alcohol use. Caffeine use Coffee. No drug use Tobacco use Never smoker.  Family History Elbert Ewings, [place]; 01/31/2015 9:06 AM) Arthritis Mother. Breast Cancer Mother. Cerebrovascular Accident Father. Depression Daughter.  Pregnancy / Birth History Elbert Ewings, CMA; 01/31/2015 9:06 AM) Age at menarche 66 years. Contraceptive History Intrauterine device. Gravida 3 Maternal age 8-20 Para 3 Regular periods    Review of Systems Elbert Ewings CMA; 01/31/2015 9:06 AM) General Not Present- Appetite Loss, Chills, Fatigue, Fever, Night Sweats, Weight Gain and Weight Loss. Skin Not Present- Change in Wart/Mole, Dryness, Hives, Jaundice, New Lesions, Non-Healing Wounds, Rash and Ulcer. HEENT Not Present- Earache, Hearing Loss, Hoarseness, Nose Bleed, Oral Ulcers, Ringing in the Ears, Seasonal  Allergies, Sinus Pain, Sore Throat, Visual Disturbances, Wears glasses/contact lenses and Yellow Eyes. Respiratory Not Present- Bloody sputum, Chronic Cough, Difficulty Breathing, Snoring and Wheezing. Breast Not Present- Breast Mass, Breast Pain, Nipple Discharge and Skin Changes. Cardiovascular Not Present- Chest Pain, Difficulty Breathing Lying Down, Leg Cramps, Palpitations, Rapid Heart Rate, Shortness of Breath and Swelling of Extremities. Gastrointestinal Present- Bloating and Constipation. Not Present- Abdominal Pain, Bloody Stool, Change in Bowel Habits, Chronic diarrhea, Difficulty Swallowing, Excessive gas, Gets full quickly at meals, Hemorrhoids, Indigestion, Nausea, Rectal Pain and Vomiting. Female Genitourinary Present- Urgency. Not Present- Frequency, Nocturia, Painful Urination and Pelvic Pain. Musculoskeletal Not Present- Back Pain, Joint Pain, Joint Stiffness, Muscle Pain, Muscle Weakness and Swelling of Extremities. Neurological Not Present- Decreased Memory, Fainting, Headaches, Numbness, Seizures, Tingling, Tremor, Trouble walking and Weakness. Psychiatric Present- Anxiety. Not Present- Bipolar, Change in Sleep Pattern, Depression, Fearful and Frequent crying. Endocrine Not Present- Cold Intolerance, Excessive Hunger, Hair Changes, Heat Intolerance, Hot flashes and New Diabetes. Hematology Not Present- Easy Bruising, Excessive bleeding, Gland problems, HIV and Persistent Infections.  BP 140/98 mmHg  Pulse 70  Temp(Src) 97.6 F (36.4 C) (Oral)  Resp 20  Ht 5\' 8"  (1.727 m)  Wt 81.647 kg (180 lb)  BMI 27.38 kg/m2  SpO2 100%  LMP 01/16/2015   Physical Exam Tina Ok MD; 01/31/2015 9:16 AM) General Mental Status-Alert. General Appearance-Consistent with stated age. Hydration-Well hydrated. Voice-Normal.  Head and Neck Head-normocephalic, atraumatic with no lesions or palpable masses. Trachea-midline. Thyroid Gland Characteristics - normal size and  consistency.  Chest and Lung Exam Chest and lung exam reveals -quiet, even and easy respiratory effort with no use of accessory muscles and on auscultation, normal breath sounds, no adventitious sounds and normal vocal resonance. Inspection Chest Wall - Normal. Back - normal.  Cardiovascular Cardiovascular examination reveals -normal heart sounds, regular rate and rhythm with no murmurs and normal pedal pulses bilaterally.  Abdomen Inspection Skin - Scar - no surgical scars. Hernias - Umbilical hernia - Incarcerated(Small approximately 1 cm.). Palpation/Percussion Normal exam - Soft, Non Tender, No Rebound tenderness, No Rigidity (guarding) and No hepatosplenomegaly. Auscultation Normal exam - Bowel sounds normal.    Assessment & Plan Tina Ok MD; [id number] AM) UMBILICAL HERNIA WITHOUT OBSTRUCTION AND WITHOUT GANGRENE (K42.9) Impression: 44 year old female with a small 1 cm umbilical hernia.  1. The patient will like to proceed to the operating room for laparoscopic umbilical hernia repair with mesh.  2. I discussed with the patient the signs and symptoms of incarceration and strangulation and the need to proceed to the ER should they occur.  3. I discussed with the patient the risks and benefits of the procedure to include but not limited to: Infection, bleeding, damage to surrounding structures, possible need for further surgery, possible nerve pain, and possible recurrence. The patient was understanding and wishes to proceed.

## 2015-02-11 NOTE — Op Note (Signed)
02/11/2015  12:08 PM  PATIENT:  Tina Frost  44 y.o. female  PRE-OPERATIVE DIAGNOSIS:  UMBILICAL HERNIA   POST-OPERATIVE DIAGNOSIS:  UMBILICAL HERNIA   PROCEDURE:  Procedure(s): LAPAROSCOPIC UMBILICAL HERNIA REPAIR WITH MESH (N/A) INSERTION OF MESH (N/A)  SURGEON:  Surgeon(s) and Role:    * Ralene Ok, MD - Primary  ANESTHESIA:   local and general  EBL:   <5cc  BLOOD ADMINISTERED:none  DRAINS: none   LOCAL MEDICATIONS USED:  BUPIVICAINE   SPECIMEN:  No Specimen  DISPOSITION OF SPECIMEN:  N/A  COUNTS:  YES  TOURNIQUET:  * No tourniquets in log *  DICTATION: .Dragon Dictation  Details of the procedure:   After the patient was consented patient was taken back to the operating room patient was then placed in supine position bilateral SCDs in place.  The patient was prepped and draped in the usual sterile fashion. After antibiotics were confirmed a timeout was called and all facts were verified. The Veress needle technique was used to insuflate the abdomen at Palmer's point. The abdomen was insufflated to 14 mm mercury. Subsequently a 5 mm trocar was placed a camera inserted there was no injury to any intra-abdominal organs.    There was seen to be an unincarcerated  1 cm umbilical hernia.  A second camera port was in placed into the left lower quadrant.   At this the Falicform ligament was taken down with Bovie cautery maintaining hemostasis.   I proceeded to reduce the hernia contents.  Once the hernia was cleared away, a Bard Ventralight 11.4cm  mesh was inserted into the abdomen.  The mesh was secured circumferentially with am Securestrap tacker in a double crown fashion.    The omentum was brought over the area of the mesh. The pneumoperitoneum was evacuated  & all trocars  were removed. The skin was reapproximated with 4-0  Monocryl sutures in a subcuticular fashion. The skin was dressed with Steri-Strips tape and gauze.  The patient was taken to the recovery  room in stable condition.   PLAN OF CARE: Discharge to home after PACU  PATIENT DISPOSITION:  PACU - hemodynamically stable.   Delay start of Pharmacological VTE agent (>24hrs) due to surgical blood loss or risk of bleeding: not applicable

## 2015-02-11 NOTE — Anesthesia Procedure Notes (Signed)
Procedure Name: Intubation Date/Time: 02/11/2015 11:36 AM Performed by: Eligha Bridegroom Pre-anesthesia Checklist: Emergency Drugs available, Patient identified, Suction available, Patient being monitored and Timeout performed Patient Re-evaluated:Patient Re-evaluated prior to inductionOxygen Delivery Method: Circle system utilized Preoxygenation: Pre-oxygenation with 100% oxygen Intubation Type: IV induction Ventilation: Mask ventilation without difficulty Laryngoscope Size: Mac and 3 Grade View: Grade I Tube type: Oral Tube size: 7.0 mm Number of attempts: 1 Airway Equipment and Method: Stylet and LTA kit utilized Placement Confirmation: ETT inserted through vocal cords under direct vision,  breath sounds checked- equal and bilateral and positive ETCO2 Secured at: 21 cm Tube secured with: Tape Dental Injury: Teeth and Oropharynx as per pre-operative assessment

## 2015-02-11 NOTE — Anesthesia Postprocedure Evaluation (Signed)
Anesthesia Post Note  Patient: Tina Frost  Procedure(s) Performed: Procedure(s) (LRB): LAPAROSCOPIC UMBILICAL HERNIA REPAIR WITH MESH (N/A) INSERTION OF MESH (N/A)  Patient location during evaluation: PACU Anesthesia Type: General Level of consciousness: awake and alert Pain management: pain level controlled Vital Signs Assessment: post-procedure vital signs reviewed and stable Respiratory status: spontaneous breathing, nonlabored ventilation, respiratory function stable and patient connected to nasal cannula oxygen Cardiovascular status: blood pressure returned to baseline and stable Postop Assessment: no signs of nausea or vomiting Anesthetic complications: no    Last Vitals:  Filed Vitals:   02/11/15 1243 02/11/15 1245  BP:  115/70  Pulse: 69 63  Temp:    Resp: 11 15    Last Pain:  Filed Vitals:   02/11/15 1301  PainSc: 7                  Nakyra Bourn DAVID

## 2015-02-11 NOTE — Transfer of Care (Signed)
Immediate Anesthesia Transfer of Care Note  Patient: Tina Frost  Procedure(s) Performed: Procedure(s): LAPAROSCOPIC UMBILICAL HERNIA REPAIR WITH MESH (N/A) INSERTION OF MESH (N/A)  Patient Location: PACU  Anesthesia Type:General  Level of Consciousness: awake, alert  and oriented  Airway & Oxygen Therapy: Patient Spontanous Breathing and Patient connected to nasal cannula oxygen  Post-op Assessment: Report given to RN and Post -op Vital signs reviewed and stable  Post vital signs: Reviewed and stable  Last Vitals:  Filed Vitals:   02/11/15 0843  BP: 140/98  Pulse: 70  Temp: 36.4 C  Resp: 20    Complications: No apparent anesthesia complications

## 2015-02-11 NOTE — Discharge Instructions (Signed)
[facility] _______[facility] ° °UMBILICAL HERNIA REPAIR: POST OP INSTRUCTIONS ° °Always review your discharge instruction sheet given to you by the facility where your surgery was performed. °IF YOU HAVE DISABILITY OR FAMILY LEAVE FORMS, YOU MUST BRING THEM TO THE OFFICE FOR PROCESSING.   °DO NOT GIVE THEM TO YOUR DOCTOR. ° °1. A  prescription for pain medication may be given to you upon discharge.  Take your pain medication as prescribed, if needed.  If narcotic pain medicine is not needed, then you may take acetaminophen (Tylenol) or ibuprofen (Advil) as needed. °2. Take your usually prescribed medications unless otherwise directed. °3. If you need a refill on your pain medication, please contact your pharmacy.  They will contact our office to request authorization. Prescriptions will not be filled after 5 pm or on week-ends. °4. You should follow a light diet the first 24 hours after arrival home, such as soup and crackers, etc.  Be sure to include lots of fluids daily.  Resume your normal diet the day after surgery. °5. Most patients will experience some swelling and bruising around the umbilicus or in the groin and scrotum.  Ice packs and reclining will help.  Swelling and bruising can take several days to resolve.  °6. It is common to experience some constipation if taking pain medication after surgery.  Increasing fluid intake and taking a stool softener (such as Colace) will usually help or prevent this problem from occurring.  A mild laxative (Milk of Magnesia or Miralax) should be taken according to package directions if there are no bowel movements after 48 hours. °7. Unless discharge instructions indicate otherwise, you may remove your bandages 24-48 hours after surgery, and you may shower at that time.  You may have steri-strips (small skin tapes) in place directly over the incision.  These strips should be left on the skin for 7-10 days.  If your surgeon used skin glue on the incision, you  may shower in 24 hours.  The glue will flake off over the next 2-3 weeks.  Any sutures or staples will be removed at the office during your follow-up visit. °8. ACTIVITIES:  You may resume regular (light) daily activities beginning the next day--such as daily self-care, walking, climbing stairs--gradually increasing activities as tolerated.  You may have sexual intercourse when it is comfortable.  Refrain from any heavy lifting or straining until approved by your doctor. °a. You may drive when you are no longer taking prescription pain medication, you can comfortably wear a seatbelt, and you can safely maneuver your car and apply brakes. °b. RETURN TO WORK:  __________________________________________________________ °9. You should see your doctor in the office for a follow-up appointment approximately 2-3 weeks after your surgery.  Make sure that you call for this appointment within a day or two after you arrive home to insure a convenient appointment time. °10. OTHER INSTRUCTIONS:  __________________________________________________________________________________________________________________________________________________________________________________________  °WHEN TO CALL YOUR DOCTOR: °1. Fever over 101.0 °2. Inability to urinate °3. Nausea and/or vomiting °4. Extreme swelling or bruising °5. Continued bleeding from incision. °6. Increased pain, redness, or drainage from the incision ° °The clinic staff is available to answer your questions during regular business hours.  Please don’t hesitate to call and ask to speak to one of the nurses for clinical concerns.  If you have a medical emergency, go to the nearest emergency room or call 911.  A surgeon from [facility] is always on call at the hospital ° ° °[address], [address], [place], [place]  [address] ? ° [address] [address], , [place]   [address] °[phone] ? [phone] ? [phone] [phone] °Web site:  [email or web address] ° °

## 2015-02-11 NOTE — Anesthesia Preprocedure Evaluation (Signed)
Anesthesia Evaluation  Patient identified by MRN, date of birth, ID band Patient awake    Reviewed: Allergy & Precautions, NPO status , Patient's Chart, lab work & pertinent test results  Airway Mallampati: I  TM Distance: >3 FB Neck ROM: Full    Dental   Pulmonary    Pulmonary exam normal        Cardiovascular Normal cardiovascular exam     Neuro/Psych Anxiety Depression    GI/Hepatic   Endo/Other    Renal/GU      Musculoskeletal   Abdominal   Peds  Hematology   Anesthesia Other Findings   Reproductive/Obstetrics                             Anesthesia Physical Anesthesia Plan  ASA: II  Anesthesia Plan: General   Post-op Pain Management:    Induction: Intravenous  Airway Management Planned: Oral ETT  Additional Equipment:   Intra-op Plan:   Post-operative Plan: Extubation in OR  Informed Consent: I have reviewed the patients History and Physical, chart, labs and discussed the procedure including the risks, benefits and alternatives for the proposed anesthesia with the patient or authorized representative who has indicated his/her understanding and acceptance.     Plan Discussed with: CRNA and Surgeon  Anesthesia Plan Comments:         Anesthesia Quick Evaluation  

## 2015-02-12 ENCOUNTER — Encounter (HOSPITAL_COMMUNITY): Payer: Self-pay | Admitting: General Surgery

## 2015-02-27 ENCOUNTER — Other Ambulatory Visit: Payer: Self-pay

## 2015-02-27 ENCOUNTER — Telehealth: Payer: Self-pay | Admitting: Family Medicine

## 2015-02-27 DIAGNOSIS — F411 Generalized anxiety disorder: Secondary | ICD-10-CM

## 2015-02-27 MED ORDER — DULOXETINE HCL 60 MG PO CPEP: 60.0000 mg | ORAL_CAPSULE | Freq: Every day | ORAL | Status: DC

## 2015-02-27 NOTE — Telephone Encounter (Signed)
Rx filled #13 with 0 refills. Enough to last until patints appointment on 03/12/15 with Dr. Etter Sjogren

## 2015-02-27 NOTE — Telephone Encounter (Signed)
Relation to [id number] Call back number:[phone] Pharmacy: [facility], [place] - [address] [phone] (Phone) [phone] (Fax)         Reason for call:  Patient requesting a refill DULoxetine (CYMBALTA) 60 MG capsule, patient states pharmacy sent 2 request and patient is completley out and would like to pick up medication today. Please advise

## 2015-03-12 ENCOUNTER — Telehealth: Payer: Self-pay | Admitting: Family Medicine

## 2015-03-12 ENCOUNTER — Ambulatory Visit: Payer: BC Managed Care – PPO | Admitting: Family Medicine

## 2015-03-12 DIAGNOSIS — F411 Generalized anxiety disorder: Secondary | ICD-10-CM

## 2015-03-12 MED ORDER — DULOXETINE HCL 60 MG PO CPEP: 60.0000 mg | ORAL_CAPSULE | Freq: Every day | ORAL | Status: DC

## 2015-03-12 NOTE — Telephone Encounter (Signed)
Should the patient be charged?

## 2015-03-12 NOTE — Telephone Encounter (Signed)
Pt called to notify she will not be here today 10:15am appt due to work conflict. Pt rescheduled for 03/26/15. Charge or no charge?  Also, she said she only has 1 cymbalta left for today and asked that it be sent in for her til her appt. [facility] - [place] - [address]

## 2015-03-12 NOTE — Telephone Encounter (Signed)
No charge. 

## 2015-03-26 ENCOUNTER — Encounter: Payer: Self-pay | Admitting: Family Medicine

## 2015-03-26 ENCOUNTER — Ambulatory Visit (INDEPENDENT_AMBULATORY_CARE_PROVIDER_SITE_OTHER): Payer: BC Managed Care – PPO | Admitting: Family Medicine

## 2015-03-26 VITALS — BP 128/88 | HR 85 | Temp 98.1°F | Wt 178.6 lb

## 2015-03-26 DIAGNOSIS — F411 Generalized anxiety disorder: Secondary | ICD-10-CM | POA: Diagnosis not present

## 2015-03-26 MED ORDER — DULOXETINE HCL 60 MG PO CPEP: 60.0000 mg | ORAL_CAPSULE | Freq: Every day | ORAL | Status: DC

## 2015-03-26 NOTE — Progress Notes (Signed)
Subjective:    Patient ID: [name], female    DOB: 01-23-1971, 45 y.o.   MRN: [id number]  Chief Complaint  Patient presents with  . Depression    Pt here for follow up of Cymbalta.    HPI Patient is in today for f/u anxeity.  Pt is doing well with meds.  No complaints.  left   Past Medical History  Diagnosis Date  . Abdominal pain   . Hyperlipidemia   . Depression   . History of kidney stones   . Anxiety     Past Surgical History  Procedure Laterality Date  . Colonoscopy  2002  . Appendectomy    . Tubal ligation    . Kidney stone surgery  2008  . Umbilical hernia repair N/A 02/11/2015    Procedure: LAPAROSCOPIC UMBILICAL HERNIA REPAIR WITH MESH;  Surgeon: Ralene Ok, MD;  Location: [facility];  Service: General;  Laterality: N/A;  . Insertion of mesh N/A 02/11/2015    Procedure: INSERTION OF MESH;  Surgeon: Ralene Ok, MD;  Location: [facility];  Service: General;  Laterality: N/A;  . Hernia repair  01/2015    Family History  Problem Relation Age of Onset  . Cancer Mother     breast  . Cancer Paternal Grandmother     lung  . Hypertension Other   . Hyperlipidemia Other   . Kidney disease Other     kidney stones    Social History   Social History  . Marital Status: Married    Spouse Name: N/A  . Number of Children: N/A  . Years of Education: N/A   Occupational History  . Owns [employer]    Social History Main Topics  . Smoking status: Never Smoker   . Smokeless tobacco: Not on file  . Alcohol Use: No  . Drug Use: No  . Sexual Activity:    Partners: Male   Other Topics Concern  . Not on file   Social History Narrative   Gets reg exercise    Outpatient Prescriptions Prior to Visit  Medication Sig Dispense Refill  . ALPRAZolam (XANAX) 0.5 MG tablet Take 1 tablet (0.5 mg total) by mouth 2 (two) times daily as needed for anxiety. 30 tablet 1  . DULoxetine (CYMBALTA) 60 MG capsule Take 1 capsule (60 mg total) by mouth daily. 15 capsule 0    . oxyCODONE-acetaminophen (ROXICET) 5-325 MG tablet Take 1-2 tablets by mouth every 4 (four) hours as needed. (Patient not taking: Reported on 03/26/2015) 30 tablet 0   No facility-administered medications prior to visit.    Allergies  Allergen Reactions  . Sulfa Antibiotics     Causes Kidney stones    Review of Systems  Constitutional: Negative for fever, chills and malaise/fatigue.  HENT: Negative for congestion and hearing loss.   Eyes: Negative for discharge.  Respiratory: Negative for cough, sputum production and shortness of breath.   Cardiovascular: Negative for chest pain, palpitations and leg swelling.  Gastrointestinal: Negative for heartburn, nausea, vomiting, abdominal pain, diarrhea, constipation and blood in stool.  Genitourinary: Negative for dysuria, urgency, frequency and hematuria.  Musculoskeletal: Negative for myalgias, back pain and falls.  Skin: Negative for rash.  Neurological: Negative for dizziness, sensory change, loss of consciousness, weakness and headaches.  Endo/Heme/Allergies: Negative for environmental allergies. Does not bruise/bleed easily.  Psychiatric/Behavioral: Negative for depression and suicidal ideas. The patient is not nervous/anxious and does not have insomnia.        Objective:  Physical Exam  Constitutional: She is oriented to person, place, and time. She appears well-developed and well-nourished. No distress.  Neurological: She is alert and oriented to person, place, and time.  Psychiatric: She has a normal mood and affect. Her behavior is normal. Judgment and thought content normal.  Nursing note and vitals reviewed.   BP 128/88 mmHg  Pulse 85  Temp(Src) 98.1 F (36.7 C) (Oral)  Wt 178 lb 9.6 oz (81.012 kg)  SpO2 99%  LMP 03/19/2015 (Approximate) Wt Readings from Last 3 Encounters:  03/26/15 178 lb 9.6 oz (81.012 kg)  02/11/15 180 lb (81.647 kg)  07/05/14 166 lb 5.4 oz (75.451 kg)     Lab Results  Component Value  Date   WBC 5.9 02/11/2015   HGB 15.7* 02/11/2015   HCT 45.0 02/11/2015   PLT 217 02/11/2015   GLUCOSE 76 04/24/2013   CHOL 187 10/12/2011   TRIG 69.0 10/12/2011   HDL 57.90 10/12/2011   LDLCALC 115* 10/12/2011   ALT 17 04/24/2013   AST 22 04/24/2013   NA 138 04/24/2013   K 3.9 04/24/2013   CL 106 04/24/2013   CREATININE 0.5 04/24/2013   BUN 5* 04/24/2013   CO2 27 04/24/2013   TSH 0.37 10/12/2011    Lab Results  Component Value Date   TSH 0.37 10/12/2011   Lab Results  Component Value Date   WBC 5.9 02/11/2015   HGB 15.7* 02/11/2015   HCT 45.0 02/11/2015   MCV 92.8 02/11/2015   PLT 217 02/11/2015   Lab Results  Component Value Date   NA 138 04/24/2013   K 3.9 04/24/2013   CO2 27 04/24/2013   GLUCOSE 76 04/24/2013   BUN 5* 04/24/2013   CREATININE 0.5 04/24/2013   BILITOT 0.7 04/24/2013   ALKPHOS 68 04/24/2013   AST 22 04/24/2013   ALT 17 04/24/2013   PROT 7.0 04/24/2013   ALBUMIN 3.9 04/24/2013   CALCIUM 8.8 04/24/2013   GFR 135.14 04/24/2013   Lab Results  Component Value Date   CHOL 187 10/12/2011   Lab Results  Component Value Date   HDL 57.90 10/12/2011   Lab Results  Component Value Date   LDLCALC 115* 10/12/2011   Lab Results  Component Value Date   TRIG 69.0 10/12/2011   Lab Results  Component Value Date   CHOLHDL 3 10/12/2011   No results found for: HGBA1C     Assessment & Plan:   Problem List Items Addressed This Visit    None    Visit Diagnoses    Generalized anxiety disorder    -  Primary    Relevant Medications    DULoxetine (CYMBALTA) 60 MG capsule     cont prn xanax and rto 6 m for cpe and will see annually after that or prn  I have discontinued Ms. Brabender's oxyCODONE-acetaminophen. I am also having her maintain her ALPRAZolam and DULoxetine.  Meds ordered this encounter  Medications  . DULoxetine (CYMBALTA) 60 MG capsule    Sig: Take 1 capsule (60 mg total) by mouth daily.    Dispense:  90 capsule    Refill:  [place], DO

## 2015-03-26 NOTE — Progress Notes (Signed)
Pre visit review using our clinic review tool, if applicable. No additional management support is needed unless otherwise documented below in the visit note. 

## 2015-10-11 ENCOUNTER — Ambulatory Visit: Payer: BC Managed Care – PPO | Admitting: Family Medicine

## 2015-10-11 ENCOUNTER — Telehealth: Payer: Self-pay | Admitting: Family Medicine

## 2015-10-11 NOTE — Telephone Encounter (Signed)
Patient called at 12:20 to inform that she could not make her 1:00 appt. Charge or No Charge?

## 2015-10-11 NOTE — Telephone Encounter (Signed)
No charge today.  

## 2015-10-14 ENCOUNTER — Encounter: Payer: Self-pay | Admitting: Medical

## 2015-10-14 ENCOUNTER — Ambulatory Visit (INDEPENDENT_AMBULATORY_CARE_PROVIDER_SITE_OTHER): Payer: BLUE CROSS/BLUE SHIELD | Admitting: Medical

## 2015-10-14 ENCOUNTER — Other Ambulatory Visit: Payer: Self-pay

## 2015-10-14 ENCOUNTER — Telehealth: Payer: Self-pay | Admitting: Medical

## 2015-10-14 VITALS — BP 143/89 | HR 79 | Temp 98.7°F | Ht 65.2 in | Wt 180.0 lb

## 2015-10-14 DIAGNOSIS — R197 Diarrhea, unspecified: Secondary | ICD-10-CM

## 2015-10-14 DIAGNOSIS — E876 Hypokalemia: Secondary | ICD-10-CM

## 2015-10-14 DIAGNOSIS — R5383 Other fatigue: Secondary | ICD-10-CM | POA: Diagnosis not present

## 2015-10-14 LAB — COMPREHENSIVE METABOLIC PANEL
ALT: 20 U/L (ref 0–35)
AST: 19 U/L (ref 0–37)
Albumin: 4.2 g/dL (ref 3.5–5.2)
Alkaline Phosphatase: 64 U/L (ref 39–117)
BUN: 9 mg/dL (ref 6–23)
CHLORIDE: 106 meq/L (ref 96–112)
CO2: 26 mEq/L (ref 19–32)
Calcium: 8.8 mg/dL (ref 8.4–10.5)
Creatinine, Ser: 0.62 mg/dL (ref 0.40–1.20)
GFR: 111.43 mL/min (ref >60.00)
GLUCOSE: 63 mg/dL — AB (ref 70–99)
POTASSIUM: 3.2 meq/L — AB (ref 3.5–5.1)
SODIUM: 139 meq/L (ref 135–145)
TOTAL PROTEIN: 7 g/dL (ref 6.0–8.3)
Total Bilirubin: 0.6 mg/dL (ref 0.2–1.2)

## 2015-10-14 LAB — CBC WITH DIFFERENTIAL/PLATELET
BASOS PCT: 0.8 % (ref 0.0–3.0)
Basophils Absolute: 0.1 10*3/uL (ref 0.0–0.1)
EOS PCT: 2.6 % (ref 0.0–5.0)
Eosinophils Absolute: 0.2 10*3/uL (ref 0.0–0.7)
HCT: 45.7 % (ref 36.0–46.0)
Hemoglobin: 15.8 g/dL — ABNORMAL HIGH (ref 12.0–15.0)
LYMPHS ABS: 1.9 10*3/uL (ref 0.7–4.0)
Lymphocytes Relative: 23.2 % (ref 12.0–46.0)
MCHC: 34.7 g/dL (ref 30.0–36.0)
MCV: 92 fl (ref 78.0–100.0)
MONO ABS: 0.5 10*3/uL (ref 0.1–1.0)
MONOS PCT: 6.5 % (ref 3.0–12.0)
NEUTROS ABS: 5.4 10*3/uL (ref 1.4–7.7)
NEUTROS PCT: 66.9 % (ref 43.0–77.0)
PLATELETS: 258 10*3/uL (ref 150.0–400.0)
RBC: 4.96 Mil/uL (ref 3.87–5.11)
RDW: 13.6 % (ref 11.5–15.5)
WBC: 8 10*3/uL (ref 4.0–10.5)

## 2015-10-14 MED ORDER — CIPROFLOXACIN HCL 500 MG PO TABS: 500.0000 mg | ORAL_TABLET | Freq: Two times a day (BID) | ORAL | 0 refills | Status: DC

## 2015-10-14 MED ORDER — ALPRAZOLAM 0.5 MG PO TABS: 0.5000 mg | ORAL_TABLET | Freq: Two times a day (BID) | ORAL | 0 refills | Status: DC | PRN

## 2015-10-14 MED ORDER — HYDROCORTISONE ACETATE 25 MG RE SUPP: 25.0000 mg | Freq: Two times a day (BID) | RECTAL | 0 refills | Status: DC

## 2015-10-14 MED ORDER — POTASSIUM CHLORIDE ER 10 MEQ PO TBCR: 10.0000 meq | EXTENDED_RELEASE_TABLET | Freq: Every day | ORAL | 0 refills | Status: DC

## 2015-10-14 MED ORDER — ONDANSETRON 8 MG PO TBDP: 8.0000 mg | ORAL_TABLET | Freq: Three times a day (TID) | ORAL | 0 refills | Status: DC | PRN

## 2015-10-14 NOTE — Telephone Encounter (Signed)
k-dur rx sent in and future cmp to do in 2 weeks. She can come in and do lab in 2 weeks. Does not need office appointment. Just to go to lab.

## 2015-10-14 NOTE — Progress Notes (Signed)
Subjective:    Patient ID: [name], female    DOB: 08-05-1970, 45 y.o.   MRN: [id number]  HPI   Pt in with some some loose stills since traveling to [place]. She left [place] this past Tuesday. Pt ate at resort but speculates still may have gotten illness some how.   Pt has 10 loose stools a day since past Tuesday. Couple of days mid last week had 5-6 loose stools but flared. Pt states some family members are still sick. Son better but husband about same a her.  Pt is feeling nausea. Comes and goes. Pt has not vomited. Her son did vomit when he was sick.  Pt states hemorrhoid flared with diarrhea. She can feel it.  Pt felt little warm and chills early.  Pt feels fatigued.  She also reports feeling nasal congested recently with sneezing.   LMP- last week.  Pt also mentioned needs refill of xanax. Pt states she has old bottle but ran out of tabs and her refill expired in April 2017. She never got the refill. Pt used tabs very sparingly.    Review of Systems  Constitutional: Negative for chills.  Respiratory: Negative for chest tightness, shortness of breath and wheezing.   Gastrointestinal: Positive for diarrhea and nausea. Negative for abdominal distention, abdominal pain, constipation, rectal pain and vomiting.       See hpi  Skin: Negative for rash.  Psychiatric/Behavioral: Negative for agitation and behavioral problems. The patient is nervous/anxious.    Past Medical History:  Diagnosis Date  . Abdominal pain   . Anxiety   . Depression   . History of kidney stones   . Hyperlipidemia      Social History   Social History  . Marital status: Married    Spouse name: N/A  . Number of children: N/A  . Years of education: N/A   Occupational History  . Owns [employer]    Social History Main Topics  . Smoking status: Never Smoker  . Smokeless tobacco: Not on file  . Alcohol use No  . Drug use: No  . Sexual activity: Yes    Partners: Male   Other Topics  Concern  . Not on file   Social History Narrative   Gets reg exercise    Past Surgical History:  Procedure Laterality Date  . APPENDECTOMY    . COLONOSCOPY  2002  . HERNIA REPAIR  01/2015  . INSERTION OF MESH N/A 02/11/2015   Procedure: INSERTION OF MESH;  Surgeon: Ralene Ok, MD;  Location: [facility];  Service: General;  Laterality: N/A;  . KIDNEY STONE SURGERY  2008  . TUBAL LIGATION    . UMBILICAL HERNIA REPAIR N/A 02/11/2015   Procedure: LAPAROSCOPIC UMBILICAL HERNIA REPAIR WITH MESH;  Surgeon: Ralene Ok, MD;  Location: [facility];  Service: General;  Laterality: N/A;    Family History  Problem Relation Age of Onset  . Cancer Mother     breast  . Cancer Paternal Grandmother     lung  . Hypertension Other   . Hyperlipidemia Other   . Kidney disease Other     kidney stones    Allergies  Allergen Reactions  . Sulfa Antibiotics     Causes Kidney stones    Current Outpatient Prescriptions on File Prior to Visit  Medication Sig Dispense Refill  . ALPRAZolam (XANAX) 0.5 MG tablet Take 1 tablet (0.5 mg total) by mouth 2 (two) times daily as needed for anxiety. 30 tablet  1  . DULoxetine (CYMBALTA) 60 MG capsule Take 1 capsule (60 mg total) by mouth daily. 90 capsule 3   No current facility-administered medications on file prior to visit.     BP (!) 143/89   Pulse 79   Temp 98.7 F (37.1 C) (Oral)   Ht 5' 5.2" (1.656 m)   Wt 180 lb (81.6 kg)   LMP 10/07/2015   SpO2 100%   BMI 29.77 kg/m       Objective:   Physical Exam   General Appearance- Not in acute distress.  HEENT Eyes- Scleraeral/Conjuntiva-bilat- Not Yellow. Mouth & Throat- Normal.  Chest and Lung Exam Auscultation: Breath sounds:-Normal. Adventitious sounds:- No Adventitious sounds.  Cardiovascular Auscultation:Rythm - Regular. Heart Sounds -Normal heart sounds.  Abdomen Inspection:-Inspection Normal.  Palpation/Perucssion: Palpation and Percussion of the abdomen reveal- Non Tender,  No Rebound tenderness, No rigidity(Guarding) and No Palpable abdominal masses.  Liver:-Normal.  Spleen:- Normal.   Neuro- CN III-XII intact.     Assessment & Plan:  You may have bacteria gastrointestinal illness. I want you to rest, hydrate, follow bland diet guidlines and take tylenol for fever.  Take imodium otc for diarrhea. For nausea or vomiting, I am prescribing zofran.   There is some chance that your have a bacterial infection so I do want you to get stool panel kit and turn that in as soon as possible. Turning stool panel kit earlier will provide [place] with quicker result. Based on number of loose stools and travel to [place] I do think best to start your cipro antibiotic pending the results.   You also mentioned likely flare of hemorrhoid. As your loose stools improve will go ahead and make anusol supp available.  For your history of anxiety will go ahead and rx xanax but no refill. For further refill would need to contact pcp.(Note very infrequent use and did not see any rx in [place] [facility])  Follow up in 7 days or as needed

## 2015-10-14 NOTE — Telephone Encounter (Signed)
Note I sent her in k tabs. I made a comment that her k was still low. I was thinking of other pt when I made that comment. Let her know she can hydrate well and take one tablet a day of k-dur. Then repeat cmp at one week rather than 2 weeks. Then will decide after repeat lab if she needs to take any more k tabs.

## 2015-10-14 NOTE — Patient Instructions (Addendum)
You may have bacteria gastrointestinal illness. I want you to rest, hydrate, follow bland diet guidlines and take tylenol for fever.  Take imodium otc for diarrhea. For nausea or vomiting, I am prescribing zofran.   There is some chance that your have a bacterial infection so I do want you to get stool panel kit and turn that in as soon as possible. Turning stool panel kit earlier will provide [place] with quicker result. Based on number of loose stools and travel to [place] I do think best to start your cipro antibiotic pending the results.   Also get cbc and cmp today.  You also mentioned likely flare of hemorrhoid. As your loose stools improve will go ahead and make anusol supp available.  For your history of anxiety will go ahead and rx xanax but no refill. For further refill would need to contact pcp.  Follow up in 7 days or as needed

## 2015-10-14 NOTE — Progress Notes (Signed)
Pre visit review using our clinic tool,if applicable. No additional management support is needed unless otherwise documented below in the visit note.  

## 2015-10-15 ENCOUNTER — Telehealth: Payer: Self-pay | Admitting: Medical

## 2015-10-15 ENCOUNTER — Telehealth: Payer: Self-pay | Admitting: Behavioral Health

## 2015-10-15 LAB — CLOSTRIDIUM DIFFICILE BY PCR: CDIFFPCR: DETECTED — AB

## 2015-10-15 MED ORDER — METRONIDAZOLE 500 MG PO TABS: 500.0000 mg | ORAL_TABLET | Freq: Three times a day (TID) | ORAL | 0 refills | Status: DC

## 2015-10-15 NOTE — Telephone Encounter (Signed)
Caller: Shavon at [facility] [phone]  Reason for call: Lab Results  She reported the following critical lab for stool culture collected: C-diff detected. Message routed to PCP.

## 2015-10-15 NOTE — Telephone Encounter (Signed)
Called patient with lab results and medication orders. Patient agreed to return in 2 weeks for for repeat labs.

## 2015-10-15 NOTE — Telephone Encounter (Signed)
Pt c dif test came back +. I advised pt to stop cipro and start flagyl. Get cmp to check k level in one week. Should gradually get better from c dif. If not improving let us know. Sometimes some resistance to tx. Advised pt husband and son to get evlauated and do stool panel. Then start treatment.

## 2015-10-16 LAB — OVA AND PARASITE EXAMINATION: OP: NONE SEEN

## 2015-10-17 ENCOUNTER — Telehealth: Payer: Self-pay | Admitting: Family Medicine

## 2015-10-17 DIAGNOSIS — A0472 Enterocolitis due to Clostridium difficile, not specified as recurrent: Secondary | ICD-10-CM

## 2015-10-17 NOTE — Telephone Encounter (Signed)
°  Relationship to patient: Self Can be reached: [phone] Pharmacy:  [place], [place] - [address] [phone] (Phone) [phone] (Fax)     Reason for call: Patient states that insurance does not cover the hydrocortisone (ANUSOL-HC) 25 MG suppository [id number] Need something that is covered by insurance. She saw Percell Miller

## 2015-10-18 LAB — STOOL CULTURE

## 2015-10-19 NOTE — Telephone Encounter (Addendum)
Talked with pt pharmacy. No suppository covered. So after discussing with pharmacist I approved hydrocortisone cream. No refills.   Amos Gaber, Percell Miller, PA-C

## 2015-10-21 ENCOUNTER — Other Ambulatory Visit: Payer: BLUE CROSS/BLUE SHIELD

## 2015-10-21 ENCOUNTER — Other Ambulatory Visit (INDEPENDENT_AMBULATORY_CARE_PROVIDER_SITE_OTHER): Payer: BLUE CROSS/BLUE SHIELD

## 2015-10-21 DIAGNOSIS — E876 Hypokalemia: Secondary | ICD-10-CM

## 2015-10-21 LAB — COMPREHENSIVE METABOLIC PANEL
ALBUMIN: 4 g/dL (ref 3.5–5.2)
ALT: 17 U/L (ref 0–35)
AST: 17 U/L (ref 0–37)
Alkaline Phosphatase: 48 U/L (ref 39–117)
BUN: 8 mg/dL (ref 6–23)
CHLORIDE: 107 meq/L (ref 96–112)
CO2: 28 meq/L (ref 19–32)
CREATININE: 0.6 mg/dL (ref 0.40–1.20)
Calcium: 8.7 mg/dL (ref 8.4–10.5)
GFR: 115.72 mL/min (ref >60.00)
Glucose, Bld: 101 mg/dL — ABNORMAL HIGH (ref 70–99)
POTASSIUM: 3.5 meq/L (ref 3.5–5.1)
SODIUM: 139 meq/L (ref 135–145)
Total Bilirubin: 0.4 mg/dL (ref 0.2–1.2)
Total Protein: 6.5 g/dL (ref 6.0–8.3)

## 2015-10-21 NOTE — Telephone Encounter (Signed)
Spoke with patient. Accepted the cream.

## 2015-10-22 ENCOUNTER — Encounter: Payer: Self-pay | Admitting: Physician Assistant

## 2015-10-22 MED ORDER — VANCOMYCIN HCL 125 MG PO CAPS: 125.0000 mg | ORAL_CAPSULE | Freq: Four times a day (QID) | ORAL | 0 refills | Status: DC

## 2015-10-22 NOTE — Telephone Encounter (Signed)
Referral to GI placed

## 2015-10-22 NOTE — Telephone Encounter (Signed)
Talked with pt yesterday. She feels she has not improved much at all. Will you call her and let her know will switch her to vancomycin 125 mg po qid x 10 days.Will send that rx in. I am going to send you a note on her husband as well. In a minute. She can stop the flagyl  Because her symptoms area persisting despite flagyl tx will go ahead and refer her to GI.

## 2015-10-22 NOTE — Telephone Encounter (Signed)
I sent in the vacomycin tab. Will refer to GI. Give [place] update on if diarrhea improved some by Friday. Sometimes for severe c dif dose can be increased but I gave her moderate dose.

## 2015-10-23 NOTE — Telephone Encounter (Signed)
Pt is now having formed stools. No longer describing loose water stools. She has about 3 days left of flagyl. She will continue k tabs every other day. Provided no recurrence of loose watery stools won't retest c dif. But if does reoccur will need to retest. If by 5 days full formed stools cancel GI appointment. Pt expresses understanding.

## 2015-10-23 NOTE — Telephone Encounter (Signed)
Left message for pt to return my call.

## 2015-10-23 NOTE — Telephone Encounter (Signed)
Called to follow up with patient. States she's no longer having watery stools, but continues to experience 5-6 soft stools per day.  It was explained to patient that GI referral has placed.  Pt states she does not have an appt with them until 10/31/15.    She says she has been taking flagyl, but the vancomycin that was called in insurance will not cover.  Wants to know if she needs to continue flagyl?  She also wants to know she take another medication in addition to flagyl?  Says if he want her to take another medication can an alternative med be called in due to insurance not covering vancomycin?   She also wants to know when she should follow up with provider?    Please advise.

## 2015-10-23 NOTE — Telephone Encounter (Signed)
Patient called to inquire about the GI referral that was sent for her. States she does not know why it was sent. ALSO, needs another medication called in because the one sent in is not covered by her pharmacy. Please adv @ [phone]. Patient states Percell Miller is handling this issue because Dr. Etter Sjogren was unavailable to see her.

## 2015-10-29 ENCOUNTER — Telehealth: Payer: Self-pay | Admitting: Medical

## 2015-10-29 NOTE — Telephone Encounter (Signed)
Last I talked with her she was improving and she stated that if she improves further/formed regular stools would cancel GI appointment. So appears that was the case. Thanks for update.

## 2015-10-31 ENCOUNTER — Ambulatory Visit: Payer: BLUE CROSS/BLUE SHIELD | Admitting: Physician Assistant

## 2015-11-27 ENCOUNTER — Telehealth: Payer: Self-pay

## 2015-11-27 NOTE — Telephone Encounter (Signed)
Patient called to see if she needs to come back in to be retested for C_dif to make sure she is clear and to follow up on her Potassium level since she has started taking a Potassium suppliment.

## 2015-11-28 NOTE — Telephone Encounter (Signed)
Went over lab results with patient states she is ok with this information.

## 2015-11-28 NOTE — Telephone Encounter (Signed)
Note I had only wrote her 14 tabs of potassium. If she has been taking otc k tabs then cmp would be recommended since sometimes can get oversupplamented.

## 2015-11-28 NOTE — Telephone Encounter (Signed)
If pt has formed stools/not watery then lab won't run test. If stools not watery/loose can't retest. Her last k was 3.5. It was in normal limit but just at the edge of normal. The k before that was 3.2. If she has any muscles cramps then would repeat cmp. But if no muscle cramps then cmp not necessary until she wants to make sure her k is still normal.

## 2016-04-20 ENCOUNTER — Telehealth: Payer: Self-pay | Admitting: Family Medicine

## 2016-04-20 DIAGNOSIS — F411 Generalized anxiety disorder: Secondary | ICD-10-CM

## 2016-04-20 MED ORDER — DULOXETINE HCL 60 MG PO CPEP: 60.0000 mg | ORAL_CAPSULE | Freq: Every day | ORAL | 0 refills | Status: DC

## 2016-04-20 NOTE — Telephone Encounter (Signed)
Pt called in to schedule and request a medication refill until her appt? Pt is requesting CYMBALTA    Scheduled for 04/28/16   Pharmacy: [facility], [place] - [id number]

## 2016-04-20 NOTE — Telephone Encounter (Signed)
Sent in to [facility]. Called the patient to inform prescription sent in.

## 2016-04-28 ENCOUNTER — Ambulatory Visit: Payer: BLUE CROSS/BLUE SHIELD | Admitting: Family Medicine

## 2016-05-29 ENCOUNTER — Ambulatory Visit: Payer: BLUE CROSS/BLUE SHIELD | Admitting: Family Medicine

## 2016-07-10 ENCOUNTER — Telehealth: Payer: Self-pay | Admitting: Family Medicine

## 2016-07-10 ENCOUNTER — Encounter: Payer: BLUE CROSS/BLUE SHIELD | Admitting: Family Medicine

## 2016-07-10 NOTE — Telephone Encounter (Signed)
Patient called at 3:12 stating she was not going to make her 3:00 appointment because of the rain. Patient rescheduled to next Friday just for a Medication follow up. Charge or No Charge?

## 2016-07-10 NOTE — Telephone Encounter (Signed)
She was on my schedule for a cpe    Not med f/u ----make sure she is not expecting cpe

## 2016-07-13 NOTE — Telephone Encounter (Signed)
Patient is aware that she will be coming in only for a medication follow up

## 2016-07-17 ENCOUNTER — Ambulatory Visit (INDEPENDENT_AMBULATORY_CARE_PROVIDER_SITE_OTHER): Payer: BLUE CROSS/BLUE SHIELD | Admitting: Family Medicine

## 2016-07-17 ENCOUNTER — Encounter: Payer: Self-pay | Admitting: Family Medicine

## 2016-07-17 VITALS — BP 138/86 | HR 96 | Temp 98.3°F | Resp 16 | Ht 65.0 in | Wt 177.4 lb

## 2016-07-17 DIAGNOSIS — K644 Residual hemorrhoidal skin tags: Secondary | ICD-10-CM | POA: Diagnosis not present

## 2016-07-17 DIAGNOSIS — F411 Generalized anxiety disorder: Secondary | ICD-10-CM | POA: Diagnosis not present

## 2016-07-17 MED ORDER — HYDROCORTISONE ACE-PRAMOXINE 1-1 % RE FOAM: 1.0000 | Freq: Two times a day (BID) | RECTAL | 0 refills | Status: DC

## 2016-07-17 MED ORDER — ALPRAZOLAM 0.5 MG PO TABS: 0.5000 mg | ORAL_TABLET | Freq: Two times a day (BID) | ORAL | 0 refills | Status: DC | PRN

## 2016-07-17 MED ORDER — DULOXETINE HCL 60 MG PO CPEP: 60.0000 mg | ORAL_CAPSULE | Freq: Every day | ORAL | 3 refills | Status: DC

## 2016-07-17 NOTE — Progress Notes (Signed)
Patient ID: Tina Frost, female   DOB: Aug 14, 1970, 46 y.o.   MRN: [id number]     Subjective:  I acted as a Education administrator for Dr. Carollee Herter.  [name], [place]   Patient ID: Tina Frost, female    DOB: [birth year], 46 y.o.   MRN: [id number]  Chief Complaint  Patient presents with  . Anxiety  . Hemorrhoids      HPI  Patient is in today for follow anxiety.  She states she has been doing well on current treatment.  She states does not take Xanax every day "about 10 times a year."   Has had a hemorrhoid since last summer and every time she has a bowel movement it bleeds.  She has tried over the counter, Preperation H suppositories and has not helped.   Patient Care Team: Carollee Herter, Alferd Apa, DO as PCP - General   Past Medical History:  Diagnosis Date  . Abdominal pain   . Anxiety   . Depression   . History of kidney stones   . Hyperlipidemia     Past Surgical History:  Procedure Laterality Date  . APPENDECTOMY    . COLONOSCOPY  2002  . HERNIA REPAIR  01/2015  . INSERTION OF MESH N/A 02/11/2015   Procedure: INSERTION OF MESH;  Surgeon: Ralene Ok, MD;  Location: [facility];  Service: General;  Laterality: N/A;  . KIDNEY STONE SURGERY  2008  . TUBAL LIGATION    . UMBILICAL HERNIA REPAIR N/A 02/11/2015   Procedure: LAPAROSCOPIC UMBILICAL HERNIA REPAIR WITH MESH;  Surgeon: Ralene Ok, MD;  Location: [facility];  Service: General;  Laterality: N/A;    Family History  Problem Relation Age of Onset  . Cancer Mother        breast  . Cancer Paternal Grandmother        lung  . Hypertension Other   . Hyperlipidemia Other   . Kidney disease Other        kidney stones    Social History   Social History  . Marital status: Married    Spouse name: N/A  . Number of children: N/A  . Years of education: N/A   Occupational History  . Owns [employer]    Social History Main Topics  . Smoking status: Never Smoker  . Smokeless tobacco: Never Used  . Alcohol use No  . Drug  use: No  . Sexual activity: Yes    Partners: Male   Other Topics Concern  . Not on file   Social History Narrative   Gets reg exercise    Outpatient Medications Prior to Visit  Medication Sig Dispense Refill  . ALPRAZolam (XANAX) 0.5 MG tablet Take 1 tablet (0.5 mg total) by mouth 2 (two) times daily as needed for anxiety. 20 tablet 0  . DULoxetine (CYMBALTA) 60 MG capsule Take 1 capsule (60 mg total) by mouth daily. 90 capsule 0  . ciprofloxacin (CIPRO) 500 MG tablet Take 1 tablet (500 mg total) by mouth 2 (two) times daily. 14 tablet 0  . metroNIDAZOLE (FLAGYL) 500 MG tablet Take 1 tablet (500 mg total) by mouth 3 (three) times daily. 30 tablet 0  . ondansetron (ZOFRAN ODT) 8 MG disintegrating tablet Take 1 tablet (8 mg total) by mouth every 8 (eight) hours as needed for nausea or vomiting. 15 tablet 0  . potassium chloride (K-DUR) 10 MEQ tablet Take 1 tablet (10 mEq total) by mouth daily. 14 tablet 0  . vancomycin (VANCOCIN) 125 MG  capsule Take 1 capsule (125 mg total) by mouth 4 (four) times daily. 40 capsule 0   No facility-administered medications prior to visit.     Allergies  Allergen Reactions  . Amoxicillin Other (See Comments)    Causes kidney stones   . Sulfa Antibiotics     Causes Kidney stones    Review of Systems  Constitutional: Negative for fever and malaise/fatigue.  HENT: Negative for congestion.   Eyes: Negative for blurred vision.  Respiratory: Negative for cough and shortness of breath.   Cardiovascular: Negative for chest pain, palpitations and leg swelling.  Gastrointestinal: Negative for vomiting.  Musculoskeletal: Negative for back pain.  Skin: Negative for rash.  Neurological: Negative for loss of consciousness and headaches.       Objective:    Physical Exam  Constitutional: She is oriented to person, place, and time. She appears well-developed and well-nourished. No distress.  HENT:  Head: Normocephalic and atraumatic.  Eyes:  Conjunctivae are normal.  Neck: Normal range of motion. No thyromegaly present.  Cardiovascular: Normal rate and regular rhythm.   Pulmonary/Chest: Effort normal and breath sounds normal. She has no wheezes.  Abdominal: Soft. Bowel sounds are normal. There is no tenderness.  Genitourinary: Rectal exam shows external hemorrhoid.  Musculoskeletal: Normal range of motion. She exhibits no edema or deformity.  Neurological: She is alert and oriented to person, place, and time.  Skin: Skin is warm and dry. She is not diaphoretic.  Psychiatric: She has a normal mood and affect.    BP 138/86 (BP Location: Right Arm, Cuff Size: Normal)   Pulse 96   Temp 98.3 F (36.8 C) (Oral)   Resp 16   Ht 5\' 5"  (1.651 m)   Wt 177 lb 6.4 oz (80.5 kg)   LMP 06/26/2016   SpO2 98%   BMI 29.52 kg/m  Wt Readings from Last 3 Encounters:  07/17/16 177 lb 6.4 oz (80.5 kg)  10/14/15 180 lb (81.6 kg)  03/26/15 178 lb 9.6 oz (81 kg)   BP Readings from Last 3 Encounters:  07/17/16 138/86  10/14/15 (!) 143/89  03/26/15 128/88     Immunization History  Administered Date(s) Administered  . Tdap 10/12/2011    Health Maintenance  Topic Date Due  . HIV Screening  05/08/1987  . INFLUENZA VACCINE  09/23/2016  . PAP SMEAR  10/24/2016  . TETANUS/TDAP  10/11/2021    Lab Results  Component Value Date   WBC 8.0 10/14/2015   HGB 15.8 (H) 10/14/2015   HCT 45.7 10/14/2015   PLT 258.0 10/14/2015   GLUCOSE 101 (H) 10/21/2015   CHOL 187 10/12/2011   TRIG 69.0 10/12/2011   HDL 57.90 10/12/2011   LDLCALC 115 (H) 10/12/2011   ALT 17 10/21/2015   AST 17 10/21/2015   NA 139 10/21/2015   K 3.5 10/21/2015   CL 107 10/21/2015   CREATININE 0.60 10/21/2015   BUN 8 10/21/2015   CO2 28 10/21/2015   TSH 0.37 10/12/2011    Lab Results  Component Value Date   TSH 0.37 10/12/2011   Lab Results  Component Value Date   WBC 8.0 10/14/2015   HGB 15.8 (H) 10/14/2015   HCT 45.7 10/14/2015   MCV 92.0 10/14/2015    PLT 258.0 10/14/2015   Lab Results  Component Value Date   NA 139 10/21/2015   K 3.5 10/21/2015   CO2 28 10/21/2015   GLUCOSE 101 (H) 10/21/2015   BUN 8 10/21/2015   CREATININE 0.60 10/21/2015  BILITOT 0.4 10/21/2015   ALKPHOS 48 10/21/2015   AST 17 10/21/2015   ALT 17 10/21/2015   PROT 6.5 10/21/2015   ALBUMIN 4.0 10/21/2015   CALCIUM 8.7 10/21/2015   GFR 115.72 10/21/2015   Lab Results  Component Value Date   CHOL 187 10/12/2011   Lab Results  Component Value Date   HDL 57.90 10/12/2011   Lab Results  Component Value Date   LDLCALC 115 (H) 10/12/2011   Lab Results  Component Value Date   TRIG 69.0 10/12/2011   Lab Results  Component Value Date   CHOLHDL 3 10/12/2011   No results found for: HGBA1C       Assessment & Plan:   Problem List Items Addressed This Visit    None    Visit Diagnoses    External hemorrhoid    -  Primary   Relevant Medications   hydrocortisone-pramoxine (PROCTOFOAM HC) rectal foam   Generalized anxiety disorder       Relevant Medications   ALPRAZolam (XANAX) 0.5 MG tablet   DULoxetine (CYMBALTA) 60 MG capsule      I have discontinued Ms. Suto's ciprofloxacin, ondansetron, potassium chloride, metroNIDAZOLE, and vancomycin. I am also having her start on hydrocortisone-pramoxine. Additionally, I am having her maintain her phentermine, ALPRAZolam, and DULoxetine.  Meds ordered this encounter  Medications  . phentermine 37.5 MG capsule    Sig: Take 37.5 mg by mouth every morning.  Marland Kitchen ALPRAZolam (XANAX) 0.5 MG tablet    Sig: Take 1 tablet (0.5 mg total) by mouth 2 (two) times daily as needed for anxiety.    Dispense:  20 tablet    Refill:  0  . DULoxetine (CYMBALTA) 60 MG capsule    Sig: Take 1 capsule (60 mg total) by mouth daily.    Dispense:  90 capsule    Refill:  3  . hydrocortisone-pramoxine (PROCTOFOAM HC) rectal foam    Sig: Place 1 applicator rectally 2 (two) times daily.    Dispense:  10 g    Refill:  0    CMA  served as scribe during this visit. History, Physical and Plan performed by medical provider. Documentation and orders reviewed and attested to.  Ann Held, DO

## 2016-07-17 NOTE — Patient Instructions (Signed)
Generalized Anxiety Disorder, Adult Generalized anxiety disorder (GAD) is a mental health disorder. People with this condition constantly worry about everyday events. Unlike normal anxiety, worry related to GAD is not triggered by a specific event. These worries also do not fade or get better with time. GAD interferes with life functions, including relationships, work, and school. GAD can vary from mild to severe. People with severe GAD can have intense waves of anxiety with physical symptoms (panic attacks). What are the causes? The exact cause of GAD is not known. What increases the risk? This condition is more likely to develop in:  Women.  People who have a family history of anxiety disorders.  People who are very shy.  People who experience very stressful life events, such as the death of a loved one.  People who have a very stressful family environment. What are the signs or symptoms? People with GAD often worry excessively about many things in their lives, such as their health and family. They may also be overly concerned about:  Doing well at work.  Being on time.  Natural disasters.  Friendships. Physical symptoms of GAD include:  Fatigue.  Muscle tension or having muscle twitches.  Trembling or feeling shaky.  Being easily startled.  Feeling like your heart is pounding or racing.  Feeling out of breath or like you cannot take a deep breath.  Having trouble falling asleep or staying asleep.  Sweating.  Nausea, diarrhea, or irritable bowel syndrome (IBS).  Headaches.  Trouble concentrating or remembering facts.  Restlessness.  Irritability. How is this diagnosed? Your health care provider can diagnose GAD based on your symptoms and medical history. You will also have a physical exam. The health care provider will ask specific questions about your symptoms, including how severe they are, when they started, and if they come and go. Your health care  provider may ask you about your use of alcohol or drugs, including prescription medicines. Your health care provider may refer you to a mental health specialist for further evaluation. Your health care provider will do a thorough examination and may perform additional tests to rule out other possible causes of your symptoms. To be diagnosed with GAD, a person must have anxiety that:  Is out of his or her control.  Affects several different aspects of his or her life, such as work and relationships.  Causes distress that makes him or her unable to take part in normal activities.  Includes at least three physical symptoms of GAD, such as restlessness, fatigue, trouble concentrating, irritability, muscle tension, or sleep problems. Before your health care provider can confirm a diagnosis of GAD, these symptoms must be present more days than they are not, and they must last for six months or longer. How is this treated? The following therapies are usually used to treat GAD:  Medicine. Antidepressant medicine is usually prescribed for long-term daily control. Antianxiety medicines may be added in severe cases, especially when panic attacks occur.  Talk therapy (psychotherapy). Certain types of talk therapy can be helpful in treating GAD by providing support, education, and guidance. Options include:  Cognitive behavioral therapy (CBT). People learn coping skills and techniques to ease their anxiety. They learn to identify unrealistic or negative thoughts and behaviors and to replace them with positive ones.  Acceptance and commitment therapy (ACT). This treatment teaches people how to be mindful as a way to cope with unwanted thoughts and feelings.  Biofeedback. This process trains you to manage your body's response (  physiological response) through breathing techniques and relaxation methods. You will work with a therapist while machines are used to monitor your physical symptoms.  Stress  management techniques. These include yoga, meditation, and exercise. A mental health specialist can help determine which treatment is best for you. Some people see improvement with one type of therapy. However, other people require a combination of therapies. Follow these instructions at home:  Take over-the-counter and prescription medicines only as told by your health care provider.  Try to maintain a normal routine.  Try to anticipate stressful situations and allow extra time to manage them.  Practice any stress management or self-calming techniques as taught by your health care provider.  Do not punish yourself for setbacks or for not making progress.  Try to recognize your accomplishments, even if they are small.  Keep all follow-up visits as told by your health care provider. This is important. Contact a health care provider if:  Your symptoms do not get better.  Your symptoms get worse.  You have signs of depression, such as:  A persistently sad, cranky, or irritable mood.  Loss of enjoyment in activities that used to bring you joy.  Change in weight or eating.  Changes in sleeping habits.  Avoiding friends or family members.  Loss of energy for normal tasks.  Feelings of guilt or worthlessness. Get help right away if:  You have serious thoughts about hurting yourself or others. If you ever feel like you may hurt yourself or others, or have thoughts about taking your own life, get help right away. You can go to your nearest emergency department or call:  Your local emergency services (911 in the [place]  A suicide crisis helpline, such as the National Suicide Prevention Lifeline at 1-800-273-8255. This is open 24 hours a day. Summary  Generalized anxiety disorder (GAD) is a mental health disorder that involves worry that is not triggered by a specific event.  People with GAD often worry excessively about many things in their lives, such as their health and  family.  GAD may cause physical symptoms such as restlessness, trouble concentrating, sleep problems, frequent sweating, nausea, diarrhea, headaches, and trembling or muscle twitching.  A mental health specialist can help determine which treatment is best for you. Some people see improvement with one type of therapy. However, other people require a combination of therapies. This information is not intended to replace advice given to you by your health care provider. Make sure you discuss any questions you have with your health care provider. Document Released: 06/06/2012 Document Revised: 12/31/2015 Document Reviewed: 12/31/2015 Elsevier Interactive Patient Education  2017 [employer]  

## 2016-07-27 ENCOUNTER — Telehealth: Payer: Self-pay

## 2016-07-27 ENCOUNTER — Telehealth: Payer: Self-pay | Admitting: Family Medicine

## 2016-07-27 NOTE — Telephone Encounter (Signed)
Pharmacy informed covered alternatives Hydrocortisone 2.5% cream

## 2016-07-27 NOTE — Telephone Encounter (Signed)
PA initiated via Covermymeds; KEY: [id number]. Awaiting determination.

## 2016-07-27 NOTE — Telephone Encounter (Signed)
Did they give alternatives they would pay for ?   anusol hc cream

## 2016-07-27 NOTE — Telephone Encounter (Signed)
Advise on alternative foam

## 2016-07-27 NOTE — Telephone Encounter (Signed)
Ok to use bid

## 2016-07-27 NOTE — Telephone Encounter (Signed)
Caller name: Relationship to patient: Self Can be reached: [phone] Pharmacy:  [facility],  - [address] [phone] (Phone) [phone] (Fax)     Reason for call: Patient states that Rx for hydrocortisone-pramoxine (PROCTOFOAM [facility]) rectal foam [[id number]]  Needs a PA. States she will be willing to try another foam that will not need the PA

## 2016-07-28 MED ORDER — HYDROCORTISONE 2.5 % RE CREA: 1.0000 "application " | TOPICAL_CREAM | Freq: Two times a day (BID) | RECTAL | 2 refills | Status: DC

## 2016-07-28 NOTE — Telephone Encounter (Signed)
Cream sent in

## 2016-07-30 NOTE — Telephone Encounter (Signed)
PA denied. Have not received PA denial notification letter.

## 2016-09-16 ENCOUNTER — Encounter: Payer: Self-pay | Admitting: Gastroenterology

## 2016-09-30 ENCOUNTER — Encounter: Payer: Self-pay | Admitting: Gastroenterology

## 2016-09-30 ENCOUNTER — Ambulatory Visit (INDEPENDENT_AMBULATORY_CARE_PROVIDER_SITE_OTHER): Payer: BLUE CROSS/BLUE SHIELD | Admitting: Gastroenterology

## 2016-09-30 VITALS — BP 120/86 | HR 80 | Ht 65.2 in | Wt 182.5 lb

## 2016-09-30 DIAGNOSIS — K625 Hemorrhage of anus and rectum: Secondary | ICD-10-CM | POA: Diagnosis not present

## 2016-09-30 DIAGNOSIS — K601 Chronic anal fissure: Secondary | ICD-10-CM | POA: Diagnosis not present

## 2016-09-30 DIAGNOSIS — K644 Residual hemorrhoidal skin tags: Secondary | ICD-10-CM | POA: Insufficient documentation

## 2016-09-30 HISTORY — DX: Residual hemorrhoidal skin tags: K64.4

## 2016-09-30 HISTORY — DX: Chronic anal fissure: K60.1

## 2016-09-30 HISTORY — DX: Hemorrhage of anus and rectum: K62.5

## 2016-09-30 MED ORDER — AMBULATORY NON FORMULARY MEDICATION: 1 refills | Status: DC

## 2016-09-30 NOTE — Patient Instructions (Signed)
We sent a prescription for Nitroglycerin gel, 0.125 mg.  Use rectally 3 times daily for 8-10-weeks.  Consider daily fiber powder such as Citrucel or Benefiber.  We have given you rectal care instructions.

## 2016-09-30 NOTE — Progress Notes (Signed)
09/30/2016 Tina Frost [id number] [birth year]   HISTORY OF PRESENT ILLNESS:  This is a pleasant 46 year old female who is presenting under Dr. Deatra Ina. Her care will be assumed by Dr. Ardis Hughs.  She had colonoscopy in October 2002 which time the study was normal.  She presents to our office today with complaints of rectal bleeding.  She tells me that she has known that she's had hemorrhoids for the past few years and has had intermittent issues with them, but now has been experiencing rectal bleeding described as bright red in color for about the past 6 months. The bleeding occurs only with bowel movements, but is occurring about on a daily basis at this point. She says that her stools are never extremely hard, but she does alternate from some mild constipation to loose stools. She also reports slight discomfort in her anus. She saw her PCP back in May when she was examined and found to have an external hemorrhoid. She was treated with hydrocortisone cream, but says that that has not helped.  She denies any abdominal pain.   Past Medical History:  Diagnosis Date  . Abdominal pain   . Anxiety   . Depression   . History of kidney stones   . Hyperlipidemia    Past Surgical History:  Procedure Laterality Date  . APPENDECTOMY    . COLONOSCOPY  2002  . HERNIA REPAIR  01/2015  . INSERTION OF MESH N/A 02/11/2015   Procedure: INSERTION OF MESH;  Surgeon: Ralene Ok, MD;  Location: [place];  Service: General;  Laterality: N/A;  . KIDNEY STONE SURGERY  2008  . TUBAL LIGATION    . UMBILICAL HERNIA REPAIR N/A 02/11/2015   Procedure: LAPAROSCOPIC UMBILICAL HERNIA REPAIR WITH MESH;  Surgeon: Ralene Ok, MD;  Location: [place];  Service: General;  Laterality: N/A;    reports that she has never smoked. She has never used smokeless tobacco. She reports that she does not drink alcohol or use drugs. family history includes Alzheimer's disease in her maternal grandfather; Breast cancer in her  mother; Hyperlipidemia in her other; Hypertension in her other; Kidney disease in her other; Lung cancer in her paternal grandmother; Stroke in her father. Allergies  Allergen Reactions  . Amoxicillin Other (See Comments)    Causes kidney stones   . Sulfa Antibiotics     Causes Kidney stones      Outpatient Encounter Prescriptions as of 09/30/2016  Medication Sig  . ALPRAZolam (XANAX) 0.5 MG tablet Take 1 tablet (0.5 mg total) by mouth 2 (two) times daily as needed for anxiety. (Patient taking differently: Take 0.5 mg by mouth as needed for anxiety. )  . DULoxetine (CYMBALTA) 60 MG capsule Take 1 capsule (60 mg total) by mouth daily.  . AMBULATORY NON FORMULARY MEDICATION Medication Name: Nitroglycerine ointment 0.125 %  Apply a pea sized amount internally four times daily. Dispense 30 GM  With 1 refill.  Use for 8-10-weeks.  . [DISCONTINUED] hydrocortisone (ANUSOL-HC) 2.5 % rectal cream Place 1 application rectally 2 (two) times daily.  . [DISCONTINUED] phentermine 37.5 MG capsule Take 37.5 mg by mouth every morning.   No facility-administered encounter medications on file as of 09/30/2016.      REVIEW OF SYSTEMS  : All other systems reviewed and negative except where noted in the History of Present Illness.   PHYSICAL EXAM: BP 120/86   Pulse 80   Ht 5' 5.2" (1.656 m)   Wt 182 lb 8 oz (82.8 kg)  BMI 30.18 kg/m  General: Well developed white female in no acute distress Head: Normocephalic and atraumatic Eyes:  Sclerae anicteric, conjunctiva pink. Ears: Normal auditory acuity Lungs: Clear throughout to auscultation; no increased WOB. Heart: Regular rate and rhythm; no M/R/G. Abdomen: Soft, non-distended.  BS present.  Non-tender. Rectal:  Posterior external hemorrhoid noted, but not inflamed/irritated or with stigmata of recent bleeding.  DRE revealed tenderness posteriorly.  No over bleeding.  Anoscopy was tolerated but uncomfortable.  Some internal hemorrhoids were noted as  well as a posterior anal fissure. Musculoskeletal: Symmetrical with no gross deformities  Skin: No lesions on visible extremities Extremities: No edema  Neurological: Alert oriented x 4, grossly non-focal Psychological:  Alert and cooperative. Normal mood and affect  ASSESSMENT AND PLAN: -Rectal bleeding and discomfort:  Has a posterior external hemorrhoid, but does not appear to be the issue currently.  Appears to have a posterior anal fissure by anoscopy.  Will treat with nitro gel 0.125 % BID-TID for 8-10 weeks.  Keep stools soft and consider daily powder fiber supplement such as Benefiber or citrucel.  Discussed rectal care instructions and paperwork given.  Will follow-up in about 6-8 weeks for reassessment.  If bleeding continues then may want to consider checking CBC to evaluate for any anemia/drop in Hgb (was completely normal one year ago).   CC:  Ann Held, *

## 2016-09-30 NOTE — Progress Notes (Signed)
I agree with the above note, plan 

## 2016-11-24 ENCOUNTER — Encounter: Payer: Self-pay | Admitting: Family Medicine

## 2016-12-04 ENCOUNTER — Ambulatory Visit: Payer: BLUE CROSS/BLUE SHIELD | Admitting: Gastroenterology

## 2016-12-28 DIAGNOSIS — Z30431 Encounter for routine checking of intrauterine contraceptive device: Secondary | ICD-10-CM | POA: Diagnosis not present

## 2016-12-28 DIAGNOSIS — Z1231 Encounter for screening mammogram for malignant neoplasm of breast: Secondary | ICD-10-CM | POA: Diagnosis not present

## 2016-12-28 DIAGNOSIS — R87610 Atypical squamous cells of undetermined significance on cytologic smear of cervix (ASC-US): Secondary | ICD-10-CM | POA: Diagnosis not present

## 2016-12-28 DIAGNOSIS — Z01419 Encounter for gynecological examination (general) (routine) without abnormal findings: Secondary | ICD-10-CM | POA: Diagnosis not present

## 2016-12-28 DIAGNOSIS — Z130 Encounter for screening for diseases of the blood and blood-forming organs and certain disorders involving the immune mechanism: Secondary | ICD-10-CM | POA: Diagnosis not present

## 2016-12-28 DIAGNOSIS — Z124 Encounter for screening for malignant neoplasm of cervix: Secondary | ICD-10-CM | POA: Diagnosis not present

## 2016-12-28 DIAGNOSIS — Z1151 Encounter for screening for human papillomavirus (HPV): Secondary | ICD-10-CM | POA: Diagnosis not present

## 2016-12-28 DIAGNOSIS — E538 Deficiency of other specified B group vitamins: Secondary | ICD-10-CM | POA: Diagnosis not present

## 2016-12-28 DIAGNOSIS — Z6829 Body mass index (BMI) 29.0-29.9, adult: Secondary | ICD-10-CM | POA: Diagnosis not present

## 2016-12-28 DIAGNOSIS — Z1389 Encounter for screening for other disorder: Secondary | ICD-10-CM | POA: Diagnosis not present

## 2017-01-04 DIAGNOSIS — D251 Intramural leiomyoma of uterus: Secondary | ICD-10-CM | POA: Diagnosis not present

## 2017-01-04 DIAGNOSIS — T8339XA Other mechanical complication of intrauterine contraceptive device, initial encounter: Secondary | ICD-10-CM | POA: Diagnosis not present

## 2017-01-04 DIAGNOSIS — Z30431 Encounter for routine checking of intrauterine contraceptive device: Secondary | ICD-10-CM | POA: Diagnosis not present

## 2017-03-17 ENCOUNTER — Other Ambulatory Visit: Payer: Self-pay | Admitting: Physician Assistant

## 2017-03-17 DIAGNOSIS — D485 Neoplasm of uncertain behavior of skin: Secondary | ICD-10-CM | POA: Diagnosis not present

## 2017-03-17 DIAGNOSIS — D229 Melanocytic nevi, unspecified: Secondary | ICD-10-CM | POA: Insufficient documentation

## 2017-03-17 HISTORY — DX: Melanocytic nevi, unspecified: D22.9

## 2017-03-26 ENCOUNTER — Ambulatory Visit: Payer: BLUE CROSS/BLUE SHIELD | Admitting: Family Medicine

## 2017-04-02 ENCOUNTER — Other Ambulatory Visit: Payer: BLUE CROSS/BLUE SHIELD

## 2017-04-02 ENCOUNTER — Encounter: Payer: Self-pay | Admitting: Family Medicine

## 2017-04-02 ENCOUNTER — Ambulatory Visit: Payer: BLUE CROSS/BLUE SHIELD | Admitting: Family Medicine

## 2017-04-02 VITALS — BP 132/78 | HR 75 | Temp 98.0°F | Resp 16 | Ht 68.0 in | Wt 194.0 lb

## 2017-04-02 DIAGNOSIS — R600 Localized edema: Secondary | ICD-10-CM

## 2017-04-02 DIAGNOSIS — R5383 Other fatigue: Secondary | ICD-10-CM

## 2017-04-02 DIAGNOSIS — E663 Overweight: Secondary | ICD-10-CM

## 2017-04-02 DIAGNOSIS — Z8639 Personal history of other endocrine, nutritional and metabolic disease: Secondary | ICD-10-CM

## 2017-04-02 HISTORY — DX: Other fatigue: R53.83

## 2017-04-02 HISTORY — DX: Personal history of other endocrine, nutritional and metabolic disease: Z86.39

## 2017-04-02 HISTORY — DX: Localized edema: R60.0

## 2017-04-02 HISTORY — DX: Overweight: E66.3

## 2017-04-02 LAB — IBC PANEL
IRON: 149 ug/dL — AB (ref 42–145)
Saturation Ratios: 49.5 % (ref 20.0–50.0)
Transferrin: 215 mg/dL (ref 212.0–360.0)

## 2017-04-02 LAB — CBC WITH DIFFERENTIAL/PLATELET
BASOS PCT: 1.5 %
Basophils Absolute: 71 cells/uL (ref 0–200)
EOS PCT: 4.7 %
Eosinophils Absolute: 221 cells/uL (ref 15–500)
HCT: 45.8 % — ABNORMAL HIGH (ref 35.0–45.0)
HEMOGLOBIN: 15.9 g/dL — AB (ref 11.7–15.5)
Lymphs Abs: 1401 cells/uL (ref 850–3900)
MCH: 31.5 pg (ref 27.0–33.0)
MCHC: 34.7 g/dL (ref 32.0–36.0)
MCV: 90.9 fL (ref 80.0–100.0)
MONOS PCT: 7.3 %
MPV: 11.3 fL (ref 7.5–12.5)
NEUTROS ABS: 2665 {cells}/uL (ref 1500–7800)
Neutrophils Relative %: 56.7 %
Platelets: 252 10*3/uL (ref 140–400)
RBC: 5.04 10*6/uL (ref 3.80–5.10)
RDW: 12 % (ref 11.0–15.0)
Total Lymphocyte: 29.8 %
WBC mixed population: 343 cells/uL (ref 200–950)
WBC: 4.7 10*3/uL (ref 3.8–10.8)

## 2017-04-02 LAB — VITAMIN B12: VITAMIN B 12: 780 pg/mL (ref 211–911)

## 2017-04-02 LAB — FERRITIN: Ferritin: 91.6 ng/mL (ref 10.0–291.0)

## 2017-04-02 LAB — VITAMIN D 25 HYDROXY (VIT D DEFICIENCY, FRACTURES): VITD: 19.35 ng/mL — AB (ref 30.00–100.00)

## 2017-04-02 MED ORDER — HYDROCHLOROTHIAZIDE 25 MG PO TABS: 25.0000 mg | ORAL_TABLET | Freq: Every day | ORAL | 3 refills | Status: DC

## 2017-04-02 NOTE — Assessment & Plan Note (Signed)
Gave her info for healthy weight and wellness Her bmi is 29.6 which is just under their min bmi so Im not sure they will take her but she would like to try

## 2017-04-02 NOTE — Assessment & Plan Note (Signed)
Check labs 

## 2017-04-02 NOTE — Patient Instructions (Signed)
DASH Eating Plan DASH stands for "Dietary Approaches to Stop Hypertension." The DASH eating plan is a healthy eating plan that has been shown to reduce high blood pressure (hypertension). It may also reduce your risk for type 2 diabetes, heart disease, and stroke. The DASH eating plan may also help with weight loss. What are tips for following this plan? General guidelines  Avoid eating more than 2,300 mg (milligrams) of salt (sodium) a day. If you have hypertension, you may need to reduce your sodium intake to 1,500 mg a day.  Limit alcohol intake to no more than 1 drink a day for nonpregnant women and 2 drinks a day for men. One drink equals 12 oz of beer, 5 oz of wine, or 1 oz of hard liquor.  Work with your health care provider to maintain a healthy body weight or to lose weight. Ask what an ideal weight is for you.  Get at least 30 minutes of exercise that causes your heart to beat faster (aerobic exercise) most days of the week. Activities may include walking, swimming, or biking.  Work with your health care provider or diet and nutrition specialist (dietitian) to adjust your eating plan to your individual calorie needs. Reading food labels  Check food labels for the amount of sodium per serving. Choose foods with less than 5 percent of the Daily Value of sodium. Generally, foods with less than 300 mg of sodium per serving fit into this eating plan.  To find whole grains, look for the word "whole" as the first word in the ingredient list. Shopping  Buy products labeled as "low-sodium" or "no salt added."  Buy fresh foods. Avoid canned foods and premade or frozen meals. Cooking  Avoid adding salt when cooking. Use salt-free seasonings or herbs instead of table salt or sea salt. Check with your health care provider or pharmacist before using salt substitutes.  Do not fry foods. Cook foods using healthy methods such as baking, boiling, grilling, and broiling instead.  Cook with  heart-healthy oils, such as olive, canola, soybean, or sunflower oil. Meal planning   Eat a balanced diet that includes: ? 5 or more servings of fruits and vegetables each day. At each meal, try to fill half of your plate with fruits and vegetables. ? Up to 6-8 servings of whole grains each day. ? Less than 6 oz of lean meat, poultry, or fish each day. A 3-oz serving of meat is about the same size as a deck of cards. One egg equals 1 oz. ? 2 servings of low-fat dairy each day. ? A serving of nuts, seeds, or beans 5 times each week. ? Heart-healthy fats. Healthy fats called Omega-3 fatty acids are found in foods such as flaxseeds and coldwater fish, like sardines, salmon, and mackerel.  Limit how much you eat of the following: ? Canned or prepackaged foods. ? Food that is high in trans fat, such as fried foods. ? Food that is high in saturated fat, such as fatty meat. ? Sweets, desserts, sugary drinks, and other foods with added sugar. ? Full-fat dairy products.  Do not salt foods before eating.  Try to eat at least 2 vegetarian meals each week.  Eat more home-cooked food and less restaurant, buffet, and fast food.  When eating at a restaurant, ask that your food be prepared with less salt or no salt, if possible. What foods are recommended? The items listed may not be a complete list. Talk with your dietitian about what   dietary choices are best for you. Grains Whole-grain or whole-wheat bread. Whole-grain or whole-wheat pasta. Brown rice. Oatmeal. Quinoa. Bulgur. Whole-grain and low-sodium cereals. Pita bread. Low-fat, low-sodium crackers. Whole-wheat flour tortillas. Vegetables Fresh or frozen vegetables (raw, steamed, roasted, or grilled). Low-sodium or reduced-sodium tomato and vegetable juice. Low-sodium or reduced-sodium tomato sauce and tomato paste. Low-sodium or reduced-sodium canned vegetables. Fruits All fresh, dried, or frozen fruit. Canned fruit in natural juice (without  added sugar). Meat and other protein foods Skinless chicken or turkey. Ground chicken or turkey. Pork with fat trimmed off. Fish and seafood. Egg whites. Dried beans, peas, or lentils. Unsalted nuts, nut butters, and seeds. Unsalted canned beans. Lean cuts of beef with fat trimmed off. Low-sodium, lean deli meat. Dairy Low-fat (1%) or fat-free (skim) milk. Fat-free, low-fat, or reduced-fat cheeses. Nonfat, low-sodium ricotta or cottage cheese. Low-fat or nonfat yogurt. Low-fat, low-sodium cheese. Fats and oils Soft margarine without trans fats. Vegetable oil. Low-fat, reduced-fat, or light mayonnaise and salad dressings (reduced-sodium). Canola, safflower, olive, soybean, and sunflower oils. Avocado. Seasoning and other foods Herbs. Spices. Seasoning mixes without salt. Unsalted popcorn and pretzels. Fat-free sweets. What foods are not recommended? The items listed may not be a complete list. Talk with your dietitian about what dietary choices are best for you. Grains Baked goods made with fat, such as croissants, muffins, or some breads. Dry pasta or rice meal packs. Vegetables Creamed or fried vegetables. Vegetables in a cheese sauce. Regular canned vegetables (not low-sodium or reduced-sodium). Regular canned tomato sauce and paste (not low-sodium or reduced-sodium). Regular tomato and vegetable juice (not low-sodium or reduced-sodium). Pickles. Olives. Fruits Canned fruit in a light or heavy syrup. Fried fruit. Fruit in cream or butter sauce. Meat and other protein foods Fatty cuts of meat. Ribs. Fried meat. Bacon. Sausage. Bologna and other processed lunch meats. Salami. Fatback. Hotdogs. Bratwurst. Salted nuts and seeds. Canned beans with added salt. Canned or smoked fish. Whole eggs or egg yolks. Chicken or turkey with skin. Dairy Whole or 2% milk, cream, and half-and-half. Whole or full-fat cream cheese. Whole-fat or sweetened yogurt. Full-fat cheese. Nondairy creamers. Whipped toppings.  Processed cheese and cheese spreads. Fats and oils Butter. Stick margarine. Lard. Shortening. Ghee. Bacon fat. Tropical oils, such as coconut, palm kernel, or palm oil. Seasoning and other foods Salted popcorn and pretzels. Onion salt, garlic salt, seasoned salt, table salt, and sea salt. Worcestershire sauce. Tartar sauce. Barbecue sauce. Teriyaki sauce. Soy sauce, including reduced-sodium. Steak sauce. Canned and packaged gravies. Fish sauce. Oyster sauce. Cocktail sauce. Horseradish that you find on the shelf. Ketchup. Mustard. Meat flavorings and tenderizers. Bouillon cubes. Hot sauce and Tabasco sauce. Premade or packaged marinades. Premade or packaged taco seasonings. Relishes. Regular salad dressings. Where to find more information:  National Heart, Lung, and Blood Institute: www.nhlbi.nih.gov  American Heart Association: www.heart.org Summary  The DASH eating plan is a healthy eating plan that has been shown to reduce high blood pressure (hypertension). It may also reduce your risk for type 2 diabetes, heart disease, and stroke.  With the DASH eating plan, you should limit salt (sodium) intake to 2,300 mg a day. If you have hypertension, you may need to reduce your sodium intake to 1,500 mg a day.  When on the DASH eating plan, aim to eat more fresh fruits and vegetables, whole grains, lean proteins, low-fat dairy, and heart-healthy fats.  Work with your health care provider or diet and nutrition specialist (dietitian) to adjust your eating plan to your individual   calorie needs. This information is not intended to replace advice given to you by your health care provider. Make sure you discuss any questions you have with your health care provider. Document Released: 01/29/2011 Document Revised: 02/03/2016 Document Reviewed: 02/03/2016 Elsevier Interactive Patient Education  2018 Elsevier Inc.  

## 2017-04-02 NOTE — Progress Notes (Signed)
Subjective:  I acted as a Education administrator for [employer], RMA   Patient ID: Tina Frost, female    DOB: 11-Jul-1970, 47 y.o.   MRN: [id number]  Chief Complaint  Patient presents with  . Fatigue    HPI  Patient is in today for concerns about being very fatigue and weight gain over last 6 months.  She also c/o worsening swelling in ankles over last 2 weeks  She is eating healthy and exercising regularly--- no results with weight loss.    Patient Care Team: Carollee Herter, Alferd Apa, DO as PCP - General   Past Medical History:  Diagnosis Date  . Abdominal pain   . Anxiety   . Depression   . History of kidney stones   . Hyperlipidemia     Past Surgical History:  Procedure Laterality Date  . APPENDECTOMY    . COLONOSCOPY  2002  . HERNIA REPAIR  01/2015  . INSERTION OF MESH N/A 02/11/2015   Procedure: INSERTION OF MESH;  Surgeon: Ralene Ok, MD;  Location: [facility];  Service: General;  Laterality: N/A;  . KIDNEY STONE SURGERY  2008  . TUBAL LIGATION    . UMBILICAL HERNIA REPAIR N/A 02/11/2015   Procedure: LAPAROSCOPIC UMBILICAL HERNIA REPAIR WITH MESH;  Surgeon: Ralene Ok, MD;  Location: [facility];  Service: General;  Laterality: N/A;    Family History  Problem Relation Age of Onset  . Breast cancer Mother   . Lung cancer Paternal Grandmother   . Stroke Father   . Hypertension Other   . Hyperlipidemia Other   . Kidney disease Other        kidney stones  . Alzheimer's disease Maternal Grandfather   . Colon cancer Neg Hx   . Rectal cancer Neg Hx   . Esophageal cancer Neg Hx   . Liver cancer Neg Hx     Social History   Socioeconomic History  . Marital status: Married    Spouse name: Not on file  . Number of children: 3  . Years of education: Not on file  . Highest education level: Not on file  Social Needs  . Financial resource strain: Not on file  . Food insecurity - worry: Not on file  . Food insecurity - inability: Not on file  . Transportation needs -  medical: Not on file  . Transportation needs - non-medical: Not on file  Occupational History  . Occupation: Owns Engineer, site  Tobacco Use  . Smoking status: Never Smoker  . Smokeless tobacco: Never Used  Substance and Sexual Activity  . Alcohol use: No  . Drug use: No  . Sexual activity: Yes    Partners: Male  Other Topics Concern  . Not on file  Social History Narrative   Gets reg exercise    Outpatient Medications Prior to Visit  Medication Sig Dispense Refill  . ALPRAZolam (XANAX) 0.5 MG tablet Take 1 tablet (0.5 mg total) by mouth 2 (two) times daily as needed for anxiety. (Patient taking differently: Take 0.5 mg by mouth as needed for anxiety. ) 20 tablet 0  . AMBULATORY NON FORMULARY MEDICATION Medication Name: Nitroglycerine ointment 0.125 %  Apply a pea sized amount internally four times daily. Dispense 30 GM  With 1 refill.  Use for 8-10-weeks. 30 g 1  . DULoxetine (CYMBALTA) 60 MG capsule Take 1 capsule (60 mg total) by mouth daily. 90 capsule 3   No facility-administered medications prior to visit.     Allergies  Allergen Reactions  . Amoxicillin Other (See Comments)    Causes kidney stones   . Sulfa Antibiotics     Causes Kidney stones    Review of Systems  Constitutional: Positive for malaise/fatigue. Negative for chills and fever.  HENT: Negative for congestion and hearing loss.   Eyes: Negative for discharge.  Respiratory: Negative for cough, sputum production and shortness of breath.   Cardiovascular: Positive for leg swelling. Negative for chest pain and palpitations.  Gastrointestinal: Negative for abdominal pain, blood in stool, constipation, diarrhea, heartburn, nausea and vomiting.  Genitourinary: Negative for dysuria, frequency, hematuria and urgency.  Musculoskeletal: Negative for back pain, falls and myalgias.  Skin: Negative for rash.  Neurological: Negative for sensory change, loss of consciousness, weakness and headaches.    Endo/Heme/Allergies: Negative for environmental allergies. Does not bruise/bleed easily.  Psychiatric/Behavioral: Negative for depression and suicidal ideas. The patient is not nervous/anxious and does not have insomnia.        Objective:    Physical Exam  Constitutional: She is oriented to person, place, and time. She appears well-developed and well-nourished.  HENT:  Head: Normocephalic and atraumatic.  Eyes: Conjunctivae and EOM are normal.  Neck: Normal range of motion. Neck supple. No JVD present. Carotid bruit is not present. No thyromegaly present.  Cardiovascular: Normal rate, regular rhythm and normal heart sounds.  No murmur heard. Pulmonary/Chest: Effort normal and breath sounds normal. No respiratory distress. She has no wheezes. She has no rales. She exhibits no tenderness.  Musculoskeletal: She exhibits edema.       Right ankle: She exhibits swelling.       Left ankle: She exhibits swelling.  Neurological: She is alert and oriented to person, place, and time.  Psychiatric: She has a normal mood and affect.  Nursing note and vitals reviewed.   BP 132/78 (BP Location: Left Arm, Patient Position: Sitting, Cuff Size: Normal)   Pulse 75   Temp 98 F (36.7 C) (Oral)   Resp 16   Ht 5\' 8"  (1.727 m)   Wt 194 lb (88 kg)   SpO2 100%   BMI 29.50 kg/m  Wt Readings from Last 3 Encounters:  04/02/17 194 lb (88 kg)  09/30/16 182 lb 8 oz (82.8 kg)  07/17/16 177 lb 6.4 oz (80.5 kg)   BP Readings from Last 3 Encounters:  04/02/17 132/78  09/30/16 120/86  07/17/16 138/86     Immunization History  Administered Date(s) Administered  . Tdap 10/12/2011    Health Maintenance  Topic Date Due  . HIV Screening  05/08/1987  . INFLUENZA VACCINE  12/03/2017 (Originally 09/23/2016)  . PAP SMEAR  01/11/2020  . TETANUS/TDAP  10/11/2021    Lab Results  Component Value Date   WBC 8.0 10/14/2015   HGB 15.8 (H) 10/14/2015   HCT 45.7 10/14/2015   PLT 258.0 10/14/2015   GLUCOSE  101 (H) 10/21/2015   CHOL 187 10/12/2011   TRIG 69.0 10/12/2011   HDL 57.90 10/12/2011   LDLCALC 115 (H) 10/12/2011   ALT 17 10/21/2015   AST 17 10/21/2015   NA 139 10/21/2015   K 3.5 10/21/2015   CL 107 10/21/2015   CREATININE 0.60 10/21/2015   BUN 8 10/21/2015   CO2 28 10/21/2015   TSH 0.37 10/12/2011    Lab Results  Component Value Date   TSH 0.37 10/12/2011   Lab Results  Component Value Date   WBC 8.0 10/14/2015   HGB 15.8 (H) 10/14/2015   HCT 45.7 10/14/2015  MCV 92.0 10/14/2015   PLT 258.0 10/14/2015   Lab Results  Component Value Date   NA 139 10/21/2015   K 3.5 10/21/2015   CO2 28 10/21/2015   GLUCOSE 101 (H) 10/21/2015   BUN 8 10/21/2015   CREATININE 0.60 10/21/2015   BILITOT 0.4 10/21/2015   ALKPHOS 48 10/21/2015   AST 17 10/21/2015   ALT 17 10/21/2015   PROT 6.5 10/21/2015   ALBUMIN 4.0 10/21/2015   CALCIUM 8.7 10/21/2015   GFR 115.72 10/21/2015   Lab Results  Component Value Date   CHOL 187 10/12/2011   Lab Results  Component Value Date   HDL 57.90 10/12/2011   Lab Results  Component Value Date   LDLCALC 115 (H) 10/12/2011   Lab Results  Component Value Date   TRIG 69.0 10/12/2011   Lab Results  Component Value Date   CHOLHDL 3 10/12/2011   No results found for: HGBA1C       Assessment & Plan:   Problem List Items Addressed This Visit    None    Visit Diagnoses    History of iron deficiency    -  Primary   Relevant Orders   CBC with Differential/Platelet   Thyroid Panel With TSH   IBC panel   Ferritin   Other fatigue       Relevant Orders   CBC with Differential/Platelet   Thyroid Panel With TSH   IBC panel   Ferritin   Vitamin B12   Vitamin D (25 hydroxy)   Vitamin D 1,25 dihydroxy   Lower extremity edema       Relevant Medications   hydrochlorothiazide (HYDRODIURIL) 25 MG tablet      I am having [name] start on hydrochlorothiazide. I am also having her maintain her ALPRAZolam, DULoxetine, and  AMBULATORY NON FORMULARY MEDICATION.  Meds ordered this encounter  Medications  . hydrochlorothiazide (HYDRODIURIL) 25 MG tablet    Sig: Take 1 tablet (25 mg total) by mouth daily.    Dispense:  90 tablet    Refill:  3    CMA served as scribe during this visit. History, Physical and Plan performed by medical provider. Documentation and orders reviewed and attested to.  Ann Held, DO

## 2017-04-02 NOTE — Assessment & Plan Note (Signed)
Check labs Healthy weight and wellness rto prn

## 2017-04-05 LAB — VITAMIN D 1,25 DIHYDROXY
Vitamin D 1, 25 (OH)2 Total: 56 pg/mL (ref 18–72)
Vitamin D3 1, 25 (OH)2: 56 pg/mL

## 2017-04-05 LAB — THYROID PANEL WITH TSH
Free Thyroxine Index: 2.6 (ref 1.4–3.8)
T3 UPTAKE: 33 % (ref 22–35)
T4 TOTAL: 7.8 ug/dL (ref 5.1–11.9)
TSH: 0.62 mIU/L

## 2017-04-07 ENCOUNTER — Telehealth: Payer: Self-pay | Admitting: Family Medicine

## 2017-04-07 NOTE — Telephone Encounter (Signed)
Copied from [place]. Topic: Quick Communication - See Telephone Encounter >> Apr 07, 2017  4:14 PM Vernona Rieger wrote: CRM for notification. See Telephone encounter for:   04/07/17.   Patient said she received her lab results on her mychart, she said she has some questions & wants a nurse to call her back. Call back is [phone]

## 2017-04-08 ENCOUNTER — Other Ambulatory Visit: Payer: Self-pay | Admitting: *Deleted

## 2017-04-08 MED ORDER — VITAMIN D (ERGOCALCIFEROL) 1.25 MG (50000 UNIT) PO CAPS: 50000.0000 [IU] | ORAL_CAPSULE | ORAL | 2 refills | Status: DC

## 2017-04-08 NOTE — Telephone Encounter (Signed)
Patient is calling back in regards to this. Please contact patient at [phone]

## 2017-04-08 NOTE — Telephone Encounter (Signed)
Patient notified of results and Rx called to pharmacy.

## 2017-04-27 ENCOUNTER — Encounter: Payer: Self-pay | Admitting: Family Medicine

## 2017-04-27 DIAGNOSIS — R600 Localized edema: Secondary | ICD-10-CM

## 2017-04-27 MED ORDER — FUROSEMIDE 20 MG PO TABS: 20.0000 mg | ORAL_TABLET | Freq: Every day | ORAL | 1 refills | Status: DC

## 2017-04-27 NOTE — Telephone Encounter (Signed)
We need to check bmp----- can change hctz to lasix 20 mg #30  1 poqd Elevated legs Get compression socks -----also called diabetic socks --- can get them at [facility]/ [employer]--- [employer] has them and [employer] Ov 2 weeks or sooner prn

## 2017-08-10 ENCOUNTER — Encounter: Payer: Self-pay | Admitting: Medical

## 2017-08-10 ENCOUNTER — Ambulatory Visit: Payer: BLUE CROSS/BLUE SHIELD | Admitting: Medical

## 2017-08-10 VITALS — BP 140/90 | HR 79 | Temp 98.3°F | Resp 16 | Ht 68.0 in | Wt 185.0 lb

## 2017-08-10 DIAGNOSIS — N3001 Acute cystitis with hematuria: Secondary | ICD-10-CM | POA: Diagnosis not present

## 2017-08-10 DIAGNOSIS — R300 Dysuria: Secondary | ICD-10-CM

## 2017-08-10 LAB — POC URINALSYSI DIPSTICK (AUTOMATED)
Bilirubin, UA: NEGATIVE
Glucose, UA: NEGATIVE
KETONES UA: NEGATIVE
Nitrite, UA: NEGATIVE
PH UA: 7.5 (ref 5.0–8.0)
PROTEIN UA: POSITIVE — AB
SPEC GRAV UA: 1.015 (ref 1.010–1.025)
Urobilinogen, UA: NEGATIVE E.U./dL — AB

## 2017-08-10 MED ORDER — NITROFURANTOIN MONOHYD MACRO 100 MG PO CAPS: 100.0000 mg | ORAL_CAPSULE | Freq: Two times a day (BID) | ORAL | 0 refills | Status: DC

## 2017-08-10 MED ORDER — PHENAZOPYRIDINE HCL 200 MG PO TABS: 200.0000 mg | ORAL_TABLET | Freq: Three times a day (TID) | ORAL | 0 refills | Status: DC | PRN

## 2017-08-10 NOTE — Progress Notes (Signed)
Subjective:    Patient ID: Tina Frost, female    DOB: 11-Jul-1970, 47 y.o.   MRN: [id number]  HPI  Pt in today reporting urinary symptoms for about 4 days.  Dysuria- yes Frequent urination-yes Hesitancy-no Suprapubic pressure-yes Fever-no chills-no Nausea-no Vomiting-no CVA pain-no History of UTI-1-2 times year. Gross hematuria- she thinks maybe/some recently.  LMP- about 3.5 weeks ago.  No history of smoking.  Review of Systems  Constitutional: Negative for chills, fatigue and fever.  Respiratory: Negative for cough, chest tightness and shortness of breath.   Cardiovascular: Negative for chest pain and palpitations.  Gastrointestinal: Positive for constipation. Negative for abdominal pain, diarrhea, rectal pain and vomiting.       Second complaint years of consiptation. Advised exercise, hydrate well. If no bm by 3rd day use dulcolax. I have seen her a few times but might consider ibs-C treatment. Counseled pt.  Genitourinary: Positive for dysuria, frequency and urgency. Negative for difficulty urinating, vaginal bleeding and vaginal pain.  Musculoskeletal: Negative for back pain.  Neurological: Negative for dizziness and headaches.  Hematological: Negative for adenopathy. Does not bruise/bleed easily.  Psychiatric/Behavioral: Negative for behavioral problems and confusion.    Past Medical History:  Diagnosis Date  . Abdominal pain   . Anxiety   . Depression   . History of kidney stones   . Hyperlipidemia      Social History   Socioeconomic History  . Marital status: Married    Spouse name: Not on file  . Number of children: 3  . Years of education: Not on file  . Highest education level: Not on file  Occupational History  . Occupation: Owns [employer]  Social Needs  . Financial resource strain: Not on file  . Food insecurity:    Worry: Not on file    Inability: Not on file  . Transportation needs:    Medical: Not on file    Non-medical: Not on  file  Tobacco Use  . Smoking status: Never Smoker  . Smokeless tobacco: Never Used  Substance and Sexual Activity  . Alcohol use: No  . Drug use: No  . Sexual activity: Yes    Partners: Male  Lifestyle  . Physical activity:    Days per week: Not on file    Minutes per session: Not on file  . Stress: Not on file  Relationships  . Social connections:    Talks on phone: Not on file    Gets together: Not on file    Attends religious service: Not on file    Active member of club or organization: Not on file    Attends meetings of clubs or organizations: Not on file    Relationship status: Not on file  . Intimate partner violence:    Fear of current or ex partner: Not on file    Emotionally abused: Not on file    Physically abused: Not on file    Forced sexual activity: Not on file  Other Topics Concern  . Not on file  Social History Narrative   Gets reg exercise    Past Surgical History:  Procedure Laterality Date  . APPENDECTOMY    . COLONOSCOPY  2002  . HERNIA REPAIR  01/2015  . INSERTION OF MESH N/A 02/11/2015   Procedure: INSERTION OF MESH;  Surgeon: Ralene Ok, MD;  Location: [facility];  Service: General;  Laterality: N/A;  . KIDNEY STONE SURGERY  2008  . TUBAL LIGATION    . UMBILICAL  HERNIA REPAIR N/A 02/11/2015   Procedure: LAPAROSCOPIC UMBILICAL HERNIA REPAIR WITH MESH;  Surgeon: Ralene Ok, MD;  Location: [facility];  Service: General;  Laterality: N/A;    Family History  Problem Relation Age of Onset  . Breast cancer Mother   . Lung cancer Paternal Grandmother   . Stroke Father   . Hypertension Other   . Hyperlipidemia Other   . Kidney disease Other        kidney stones  . Alzheimer's disease Maternal Grandfather   . Colon cancer Neg Hx   . Rectal cancer Neg Hx   . Esophageal cancer Neg Hx   . Liver cancer Neg Hx     Allergies  Allergen Reactions  . Amoxicillin Other (See Comments)    Causes kidney stones   . Sulfa Antibiotics     Causes  Kidney stones    Current Outpatient Medications on File Prior to Visit  Medication Sig Dispense Refill  . ALPRAZolam (XANAX) 0.5 MG tablet Take 1 tablet (0.5 mg total) by mouth 2 (two) times daily as needed for anxiety. (Patient taking differently: Take 0.5 mg by mouth as needed for anxiety. ) 20 tablet 0  . AMBULATORY NON FORMULARY MEDICATION Medication Name: Nitroglycerine ointment 0.125 %  Apply a pea sized amount internally four times daily. Dispense 30 GM  With 1 refill.  Use for 8-10-weeks. 30 g 1  . DULoxetine (CYMBALTA) 60 MG capsule Take 1 capsule (60 mg total) by mouth daily. 90 capsule 3  . furosemide (LASIX) 20 MG tablet Take 1 tablet (20 mg total) by mouth daily. 30 tablet 1  . Vitamin D, Ergocalciferol, (DRISDOL) 50000 units CAPS capsule Take 1 capsule (50,000 Units total) by mouth every 7 (seven) days. 4 capsule 2   No current facility-administered medications on file prior to visit.     BP 140/90 (BP Location: Right Arm, Patient Position: Sitting, Cuff Size: Small)   Pulse 79   Temp 98.3 F (36.8 C) (Oral)   Resp 16   Ht 5\' 8"  (1.727 m)   Wt 185 lb (83.9 kg)   LMP 07/09/2017   SpO2 100%   BMI 28.13 kg/m       Objective:   Physical Exam  General Appearance- Not in acute distress.  HEENT Eyes- Scleraeral/Conjuntiva-bilat- Not Yellow. Mouth & Throat- Normal.  Chest and Lung Exam Auscultation: Breath sounds:-Normal. Adventitious sounds:- No Adventitious sounds.  Cardiovascular Auscultation:Rythm - Regular. Heart Sounds -Normal heart sounds.  Abdomen Inspection:-Inspection Normal.  Palpation/Perucssion: Palpation and Percussion of the abdomen reveal- faint suprapubi  Tender, No Rebound tenderness, No rigidity(Guarding) and No Palpable abdominal masses.  Liver:-Normal.  Spleen:- Normal.   Back- no cva pain.      Assessment & Plan:  You appear to have a urinary tract infection. I am prescribing macrobid antibiotic for the probable infection. Hydrate  well. I am sending out a urine culture. During the interim if your signs and symptoms worsen rather than improving please notify us. We will notify your when the culture results are back.  Rx pyridium for urinary pain.  Follow up in 7 days or as needed.  Mackie Pai, PA-C

## 2017-08-10 NOTE — Patient Instructions (Addendum)
You appear to have a urinary tract infection. I am prescribing macrobid antibiotic for the probable infection. Hydrate well. I am sending out a urine culture. During the interim if your signs and symptoms worsen rather than improving please notify us. We will notify your when the culture results are back.  Rx pyridium for urinary pain.  Follow up in 7 days or as needed.

## 2017-08-11 LAB — URINE CULTURE
MICRO NUMBER: 90727993
SPECIMEN QUALITY: ADEQUATE

## 2017-08-27 ENCOUNTER — Telehealth: Payer: Self-pay | Admitting: Family Medicine

## 2017-08-27 ENCOUNTER — Other Ambulatory Visit: Payer: Self-pay | Admitting: Family Medicine

## 2017-08-27 DIAGNOSIS — F411 Generalized anxiety disorder: Secondary | ICD-10-CM

## 2017-08-27 NOTE — Telephone Encounter (Signed)
Patient notified that rx was sent in  

## 2017-08-27 NOTE — Telephone Encounter (Signed)
Copied from [place] [id number]. Topic: General - Other >> Aug 27, 2017 10:09 AM Lennox Solders wrote: Reason for CRM: pt is calling and will be going out of town tomorrow and needs a refill on cymbalta. [facility]. Pt has an appt schedule with dr Carollee Herter on 09-07-17. Pt last seen dr Etter Sjogren on 04-02-2017 and edward on 08-10-17

## 2017-09-07 ENCOUNTER — Ambulatory Visit: Payer: BLUE CROSS/BLUE SHIELD | Admitting: Family Medicine

## 2017-09-13 ENCOUNTER — Ambulatory Visit: Payer: BLUE CROSS/BLUE SHIELD | Admitting: Family Medicine

## 2017-09-16 ENCOUNTER — Ambulatory Visit: Payer: BLUE CROSS/BLUE SHIELD | Admitting: Family Medicine

## 2017-09-16 DIAGNOSIS — Z0289 Encounter for other administrative examinations: Secondary | ICD-10-CM

## 2017-09-23 ENCOUNTER — Ambulatory Visit: Payer: BLUE CROSS/BLUE SHIELD | Admitting: Family Medicine

## 2017-09-23 ENCOUNTER — Telehealth: Payer: Self-pay

## 2017-09-23 NOTE — Telephone Encounter (Signed)
Copied from [place] [id number]. Topic: Quick Communication - Appointment Cancellation >> Sep 23, 2017 10:56 AM Rutherford Nail, NT wrote: Patient called to cancel appointment scheduled for 09/23/17. Patient has rescheduled their appointment.  Route to department's PEC pool.

## 2017-09-27 ENCOUNTER — Ambulatory Visit: Payer: BLUE CROSS/BLUE SHIELD | Admitting: Family Medicine

## 2017-09-27 ENCOUNTER — Encounter: Payer: Self-pay | Admitting: Family Medicine

## 2017-09-27 DIAGNOSIS — F411 Generalized anxiety disorder: Secondary | ICD-10-CM

## 2017-09-27 MED ORDER — DULOXETINE HCL 60 MG PO CPEP: ORAL_CAPSULE | ORAL | 3 refills | Status: DC

## 2017-09-27 NOTE — Patient Instructions (Signed)

## 2017-09-27 NOTE — Progress Notes (Signed)
Patient ID: Tina Frost, female   DOB: 04-22-1970, 47 y.o.   MRN: [id number]     Subjective:  I acted as a Education administrator for Dr. Carollee Frost.  [name], [name]   Patient ID: Tina Frost, female    DOB: 1970-03-10, 47 y.o.   MRN: [id number]  Chief Complaint  Patient presents with  . Anxiety    HPI   Patient is in today for follow up anxiety.  Pt is doing well with current dose of meds -- no changes needed.     Patient Care Team: Tina Frost, Alferd Apa, DO as PCP - General   Past Medical History:  Diagnosis Date  . Abdominal pain   . Anxiety   . Depression   . History of kidney stones   . Hyperlipidemia     Past Surgical History:  Procedure Laterality Date  . APPENDECTOMY    . COLONOSCOPY  2002  . HERNIA REPAIR  01/2015  . INSERTION OF MESH N/A 02/11/2015   Procedure: INSERTION OF MESH;  Surgeon: Ralene Ok, MD;  Location: [facility];  Service: General;  Laterality: N/A;  . KIDNEY STONE SURGERY  2008  . TUBAL LIGATION    . UMBILICAL HERNIA REPAIR N/A 02/11/2015   Procedure: LAPAROSCOPIC UMBILICAL HERNIA REPAIR WITH MESH;  Surgeon: Ralene Ok, MD;  Location: [facility];  Service: General;  Laterality: N/A;    Family History  Problem Relation Age of Onset  . Breast cancer Mother   . Lung cancer Paternal Grandmother   . Stroke Father   . Hypertension Other   . Hyperlipidemia Other   . Kidney disease Other        kidney stones  . Alzheimer's disease Maternal Grandfather   . Colon cancer Neg Hx   . Rectal cancer Neg Hx   . Esophageal cancer Neg Hx   . Liver cancer Neg Hx     Social History   Socioeconomic History  . Marital status: Married    Spouse name: Not on file  . Number of children: 3  . Years of education: Not on file  . Highest education level: Not on file  Occupational History  . Occupation: Owns [employer]  Social Needs  . Financial resource strain: Not on file  . Food insecurity:    Worry: Not on file    Inability: Not on file  .  Transportation needs:    Medical: Not on file    Non-medical: Not on file  Tobacco Use  . Smoking status: Never Smoker  . Smokeless tobacco: Never Used  Substance and Sexual Activity  . Alcohol use: No  . Drug use: No  . Sexual activity: Yes    Partners: Male  Lifestyle  . Physical activity:    Days per week: Not on file    Minutes per session: Not on file  . Stress: Not on file  Relationships  . Social connections:    Talks on phone: Not on file    Gets together: Not on file    Attends religious service: Not on file    Active member of club or organization: Not on file    Attends meetings of clubs or organizations: Not on file    Relationship status: Not on file  . Intimate partner violence:    Fear of current or ex partner: Not on file    Emotionally abused: Not on file    Physically abused: Not on file    Forced sexual activity: Not on  file  Other Topics Concern  . Not on file  Social History Narrative   Gets reg exercise    Outpatient Medications Prior to Visit  Medication Sig Dispense Refill  . ALPRAZolam (XANAX) 0.5 MG tablet Take 1 tablet (0.5 mg total) by mouth 2 (two) times daily as needed for anxiety. (Patient taking differently: Take 0.5 mg by mouth as needed for anxiety. ) 20 tablet 0  . AMBULATORY NON FORMULARY MEDICATION Medication Name: Nitroglycerine ointment 0.125 %  Apply a pea sized amount internally four times daily. Dispense 30 GM  With 1 refill.  Use for 8-10-weeks. 30 g 1  . DULoxetine (CYMBALTA) 60 MG capsule TAKE 1 CAPSULE BY MOUTH EVERY DAY 90 capsule 0  . furosemide (LASIX) 20 MG tablet Take 1 tablet (20 mg total) by mouth daily. 30 tablet 1  . nitrofurantoin, macrocrystal-monohydrate, (MACROBID) 100 MG capsule Take 1 capsule (100 mg total) by mouth 2 (two) times daily. 14 capsule 0  . phenazopyridine (PYRIDIUM) 200 MG tablet Take 1 tablet (200 mg total) by mouth 3 (three) times daily as needed for pain. 10 tablet 0  . Vitamin D,  Ergocalciferol, (DRISDOL) 50000 units CAPS capsule Take 1 capsule (50,000 Units total) by mouth every 7 (seven) days. 4 capsule 2   No facility-administered medications prior to visit.     Allergies  Allergen Reactions  . Amoxicillin Other (See Comments)    Causes kidney stones   . Sulfa Antibiotics     Causes Kidney stones    Review of Systems  Constitutional: Negative for chills, fever and malaise/fatigue.  HENT: Negative for congestion and hearing loss.   Eyes: Negative for blurred vision and discharge.  Respiratory: Negative for cough, sputum production and shortness of breath.   Cardiovascular: Negative for chest pain, palpitations and leg swelling.  Gastrointestinal: Negative for abdominal pain, blood in stool, constipation, diarrhea, heartburn, nausea and vomiting.  Genitourinary: Negative for dysuria, frequency, hematuria and urgency.  Musculoskeletal: Negative for back pain, falls and myalgias.  Skin: Negative for rash.  Neurological: Negative for dizziness, sensory change, loss of consciousness, weakness and headaches.  Endo/Heme/Allergies: Negative for environmental allergies. Does not bruise/bleed easily.  Psychiatric/Behavioral: Negative for depression and suicidal ideas. The patient is not nervous/anxious and does not have insomnia.        Objective:    Physical Exam  Constitutional: She is oriented to person, place, and time. She appears well-developed and well-nourished.  HENT:  Head: Normocephalic and atraumatic.  Eyes: Conjunctivae and EOM are normal.  Neck: Normal range of motion. Neck supple. No JVD present. Carotid bruit is not present. No thyromegaly present.  Cardiovascular: Normal rate, regular rhythm and normal heart sounds.  No murmur heard. Pulmonary/Chest: Effort normal and breath sounds normal. No respiratory distress. She has no wheezes. She has no rales. She exhibits no tenderness.  Musculoskeletal: She exhibits no edema.  Neurological: She is  alert and oriented to person, place, and time.  Psychiatric: She has a normal mood and affect. Her behavior is normal. Judgment and thought content normal.  Nursing note and vitals reviewed.   BP 124/82 (BP Location: Left Arm, Cuff Size: Normal)   Pulse 86   Temp 98.4 F (36.9 C) (Oral)   Resp 16   Ht 5\' 8"  (1.727 m)   Wt 188 lb 9.6 oz (85.5 kg)   LMP 09/23/2017   SpO2 100%   BMI 28.68 kg/m  Wt Readings from Last 3 Encounters:  09/27/17 188 lb 9.6 oz (85.5  kg)  08/10/17 185 lb (83.9 kg)  04/02/17 194 lb (88 kg)   BP Readings from Last 3 Encounters:  09/27/17 124/82  08/10/17 140/90  04/02/17 132/78     Immunization History  Administered Date(s) Administered  . Tdap 10/12/2011    Health Maintenance  Topic Date Due  . HIV Screening  05/08/1987  . INFLUENZA VACCINE  12/03/2017 (Originally 09/23/2017)  . PAP SMEAR  01/11/2020  . TETANUS/TDAP  10/11/2021    Lab Results  Component Value Date   WBC 4.7 04/02/2017   HGB 15.9 (H) 04/02/2017   HCT 45.8 (H) 04/02/2017   PLT 252 04/02/2017   GLUCOSE 101 (H) 10/21/2015   CHOL 187 10/12/2011   TRIG 69.0 10/12/2011   HDL 57.90 10/12/2011   LDLCALC 115 (H) 10/12/2011   ALT 17 10/21/2015   AST 17 10/21/2015   NA 139 10/21/2015   K 3.5 10/21/2015   CL 107 10/21/2015   CREATININE 0.60 10/21/2015   BUN 8 10/21/2015   CO2 28 10/21/2015   TSH 0.62 04/02/2017    Lab Results  Component Value Date   TSH 0.62 04/02/2017   Lab Results  Component Value Date   WBC 4.7 04/02/2017   HGB 15.9 (H) 04/02/2017   HCT 45.8 (H) 04/02/2017   MCV 90.9 04/02/2017   PLT 252 04/02/2017   Lab Results  Component Value Date   NA 139 10/21/2015   K 3.5 10/21/2015   CO2 28 10/21/2015   GLUCOSE 101 (H) 10/21/2015   BUN 8 10/21/2015   CREATININE 0.60 10/21/2015   BILITOT 0.4 10/21/2015   ALKPHOS 48 10/21/2015   AST 17 10/21/2015   ALT 17 10/21/2015   PROT 6.5 10/21/2015   ALBUMIN 4.0 10/21/2015   CALCIUM 8.7 10/21/2015   GFR  115.72 10/21/2015   Lab Results  Component Value Date   CHOL 187 10/12/2011   Lab Results  Component Value Date   HDL 57.90 10/12/2011   Lab Results  Component Value Date   LDLCALC 115 (H) 10/12/2011   Lab Results  Component Value Date   TRIG 69.0 10/12/2011   Lab Results  Component Value Date   CHOLHDL 3 10/12/2011   No results found for: HGBA1C       Assessment & Plan:   Problem List Items Addressed This Visit    None    Visit Diagnoses    Generalized anxiety disorder       Relevant Medications   DULoxetine (CYMBALTA) 60 MG capsule     stable-- con't meds Rto 1 year Pt sees gyn annually and they do blood work She will have them send It to [place]  I have discontinued [name]. Catania's ALPRAZolam, AMBULATORY NON FORMULARY MEDICATION, Vitamin D (Ergocalciferol), furosemide, nitrofurantoin (macrocrystal-monohydrate), and phenazopyridine. I am also having her maintain her DULoxetine.  Meds ordered this encounter  Medications  . DULoxetine (CYMBALTA) 60 MG capsule    Sig: TAKE 1 CAPSULE BY MOUTH EVERY DAY    Dispense:  90 capsule    Refill:  3    CMA served as scribe during this visit. History, Physical and Plan performed by medical provider. Documentation and orders reviewed and attested to.  Ann Held, DO

## 2017-12-14 ENCOUNTER — Telehealth: Payer: Self-pay | Admitting: Family Medicine

## 2017-12-14 DIAGNOSIS — F411 Generalized anxiety disorder: Secondary | ICD-10-CM

## 2017-12-14 MED ORDER — DULOXETINE HCL 60 MG PO CPEP: ORAL_CAPSULE | ORAL | 1 refills | Status: DC

## 2017-12-14 NOTE — Telephone Encounter (Signed)
Patient notified rx has been sent to new pharmacy.

## 2017-12-14 NOTE — Telephone Encounter (Signed)
Copied from [place] [phone]. Topic: Quick Communication - See Telephone Encounter >> Dec 14, 2017  9:15 AM Conception Chancy, NT wrote: CRM for notification. See Telephone encounter for: 12/14/17.  Patient is requesting a refill on DULoxetine (CYMBALTA) 60 MG capsule. She has refills on this prescription but the pharmacy has shut down that she was using. She needs this transferred to the pharmacy provided below.  [facility], [place], [place] - [address] [place] [address] Phone: [phone] Fax: [phone]

## 2017-12-16 DIAGNOSIS — M7732 Calcaneal spur, left foot: Secondary | ICD-10-CM | POA: Diagnosis not present

## 2017-12-16 DIAGNOSIS — M722 Plantar fascial fibromatosis: Secondary | ICD-10-CM | POA: Diagnosis not present

## 2017-12-16 DIAGNOSIS — S93692A Other sprain of left foot, initial encounter: Secondary | ICD-10-CM | POA: Diagnosis not present

## 2018-02-28 ENCOUNTER — Other Ambulatory Visit: Payer: Self-pay | Admitting: Family Medicine

## 2018-02-28 ENCOUNTER — Encounter: Payer: Self-pay | Admitting: Family Medicine

## 2018-02-28 DIAGNOSIS — F419 Anxiety disorder, unspecified: Secondary | ICD-10-CM

## 2018-02-28 MED ORDER — ALPRAZOLAM 0.25 MG PO TABS: 0.2500 mg | ORAL_TABLET | Freq: Three times a day (TID) | ORAL | 0 refills | Status: DC | PRN

## 2018-03-01 ENCOUNTER — Other Ambulatory Visit: Payer: Self-pay | Admitting: Family Medicine

## 2018-03-01 NOTE — Telephone Encounter (Signed)
I sent this in yesterday

## 2018-03-22 ENCOUNTER — Other Ambulatory Visit: Payer: Self-pay | Admitting: Family Medicine

## 2018-03-22 DIAGNOSIS — F411 Generalized anxiety disorder: Secondary | ICD-10-CM

## 2018-05-03 ENCOUNTER — Ambulatory Visit: Payer: BLUE CROSS/BLUE SHIELD | Admitting: Medical

## 2018-05-03 ENCOUNTER — Other Ambulatory Visit: Payer: Self-pay

## 2018-05-03 ENCOUNTER — Encounter: Payer: Self-pay | Admitting: Medical

## 2018-05-03 VITALS — BP 118/86 | HR 83 | Temp 98.2°F | Resp 18 | Ht 68.0 in | Wt 189.6 lb

## 2018-05-03 DIAGNOSIS — R350 Frequency of micturition: Secondary | ICD-10-CM

## 2018-05-03 LAB — POCT URINALYSIS DIP (MANUAL ENTRY)
Bilirubin, UA: NEGATIVE
Blood, UA: NEGATIVE
Glucose, UA: NEGATIVE mg/dL
Ketones, POC UA: NEGATIVE mg/dL
NITRITE UA: NEGATIVE
Protein Ur, POC: NEGATIVE mg/dL
Spec Grav, UA: 1.01 (ref 1.010–1.025)
Urobilinogen, UA: 0.2 E.U./dL
pH, UA: 8.5 — AB (ref 5.0–8.0)

## 2018-05-03 MED ORDER — CIPROFLOXACIN HCL 250 MG PO TABS: 250.0000 mg | ORAL_TABLET | Freq: Two times a day (BID) | ORAL | 0 refills | Status: DC

## 2018-05-03 NOTE — Patient Instructions (Addendum)
You appear to have a urinary tract infection. I am prescribing  cipro antibiotic for the probable infection pending culture results. Hydrate well. I am sending out a urine culture. During the interim if your signs and symptoms worsen rather than improving please notify us. We will notify your when the culture results are back.  Can continue over the counter med for urinary pain.  Advise probiotic rich food while on antibiotic or probiotics over the counter.  Follow up in 7 days or as needed.

## 2018-05-03 NOTE — Progress Notes (Signed)
Subjective:    Patient ID: Tina Frost, female    DOB: 1970-05-20, 48 y.o.   MRN: [id number]  HPI  Pt in today reporting urinary symptoms x 2 weeks.  Dysuria- yes Frequent urination-yes Hesitancy-yes Suprapubic pressure-yes Fever-no chills-no Nausea-no Vomiting-no CVA pain-no History of UTI-yes Gross hematuria-no Odor to urine.-yes  Symptoms are getting gradually worse.  LMP- 2.5 weeks ago. (hx of tubal ligation and has iud due to hx of heavy cycles)  Review of Systems  Constitutional: Negative for chills, fatigue and fever.  Respiratory: Negative for chest tightness and wheezing.   Cardiovascular: Negative for chest pain and palpitations.  Gastrointestinal: Negative for abdominal pain.  Genitourinary: Positive for difficulty urinating, dysuria, frequency and urgency. Negative for pelvic pain.  Musculoskeletal: Negative for back pain.  Neurological: Negative for dizziness.  Hematological: Negative for adenopathy. Does not bruise/bleed easily.  Psychiatric/Behavioral: Negative for behavioral problems and confusion.   Past Medical History:  Diagnosis Date  . Abdominal pain   . Anxiety   . Depression   . History of kidney stones   . Hyperlipidemia      Social History   Socioeconomic History  . Marital status: Married    Spouse name: Not on file  . Number of children: 3  . Years of education: Not on file  . Highest education level: Not on file  Occupational History  . Occupation: Owns [employer]  Social Needs  . Financial resource strain: Not on file  . Food insecurity:    Worry: Not on file    Inability: Not on file  . Transportation needs:    Medical: Not on file    Non-medical: Not on file  Tobacco Use  . Smoking status: Never Smoker  . Smokeless tobacco: Never Used  Substance and Sexual Activity  . Alcohol use: No  . Drug use: No  . Sexual activity: Yes    Partners: Male  Lifestyle  . Physical activity:    Days per week: Not on file   Minutes per session: Not on file  . Stress: Not on file  Relationships  . Social connections:    Talks on phone: Not on file    Gets together: Not on file    Attends religious service: Not on file    Active member of club or organization: Not on file    Attends meetings of clubs or organizations: Not on file    Relationship status: Not on file  . Intimate partner violence:    Fear of current or ex partner: Not on file    Emotionally abused: Not on file    Physically abused: Not on file    Forced sexual activity: Not on file  Other Topics Concern  . Not on file  Social History Narrative   Gets reg exercise    Past Surgical History:  Procedure Laterality Date  . APPENDECTOMY    . COLONOSCOPY  2002  . HERNIA REPAIR  01/2015  . INSERTION OF MESH N/A 02/11/2015   Procedure: INSERTION OF MESH;  Surgeon: Ralene Ok, MD;  Location: [facility];  Service: General;  Laterality: N/A;  . KIDNEY STONE SURGERY  2008  . TUBAL LIGATION    . UMBILICAL HERNIA REPAIR N/A 02/11/2015   Procedure: LAPAROSCOPIC UMBILICAL HERNIA REPAIR WITH MESH;  Surgeon: Ralene Ok, MD;  Location: [facility];  Service: General;  Laterality: N/A;    Family History  Problem Relation Age of Onset  . Breast cancer Mother   .  Lung cancer Paternal Grandmother   . Stroke Father   . Hypertension Other   . Hyperlipidemia Other   . Kidney disease Other        kidney stones  . Alzheimer's disease Maternal Grandfather   . Colon cancer Neg Hx   . Rectal cancer Neg Hx   . Esophageal cancer Neg Hx   . Liver cancer Neg Hx     Allergies  Allergen Reactions  . Amoxicillin Other (See Comments)    Causes kidney stones   . Sulfa Antibiotics     Causes Kidney stones    Current Outpatient Medications on File Prior to Visit  Medication Sig Dispense Refill  . ALPRAZolam (XANAX) 0.25 MG tablet Take 1 tablet (0.25 mg total) by mouth 3 (three) times daily as needed for anxiety. 30 tablet 0  . DULoxetine (CYMBALTA) 60 MG  capsule TAKE ONE CAPSULE BY MOUTH EVERY DAY 90 capsule 1   No current facility-administered medications on file prior to visit.     BP 118/86 (BP Location: Left Arm, Patient Position: Sitting, Cuff Size: Normal)   Pulse 83   Temp 98.2 F (36.8 C) (Oral)   Resp 18   Ht 5\' 8"  (1.727 m)   Wt 189 lb 9.6 oz (86 kg)   SpO2 98%   BMI 28.83 kg/m       Objective:   Physical Exam  General- No acute distress. Pleasant patient. Neck- Full range of motion, no jvd Lungs- Clear, even and unlabored. Heart- regular rate and rhythm. Neurologic- CNII- XII grossly intact.  Abdomen- suprapubic tenderness. +bs. No rebound , no guarding an no organomegally. Rt side inguinal region. No bulge felt.  Back- no cva tenderness.      Assessment & Plan:  You appear to have a urinary tract infection. I am prescribing  cipro antibiotic for the probable infection pending culture results. Hydrate well. I am sending out a urine culture. During the interim if your signs and symptoms worsen rather than improving please notify us. We will notify your when the culture results are back.  Can continue over the counter med for urinary pain.  Advise probiotic rich food while on antibiotic or probiotics over the counter.  Follow up in 7 days or as needed.

## 2018-05-05 ENCOUNTER — Telehealth: Payer: Self-pay | Admitting: Medical

## 2018-05-05 LAB — URINE CULTURE
MICRO NUMBER:: 299542
SPECIMEN QUALITY:: ADEQUATE

## 2018-05-05 MED ORDER — CIPROFLOXACIN HCL 250 MG PO TABS: 250.0000 mg | ORAL_TABLET | Freq: Two times a day (BID) | ORAL | 0 refills | Status: DC

## 2018-05-05 NOTE — Telephone Encounter (Signed)
Additional 4 days cipro sent to pt pharmacy/

## 2018-09-07 ENCOUNTER — Encounter: Payer: Self-pay | Admitting: *Deleted

## 2018-09-20 ENCOUNTER — Ambulatory Visit (INDEPENDENT_AMBULATORY_CARE_PROVIDER_SITE_OTHER): Payer: BLUE CROSS/BLUE SHIELD | Admitting: Medical

## 2018-09-20 ENCOUNTER — Other Ambulatory Visit: Payer: Self-pay

## 2018-09-20 ENCOUNTER — Encounter: Payer: Self-pay | Admitting: Medical

## 2018-09-20 VITALS — Ht 68.0 in | Wt 180.0 lb

## 2018-09-20 DIAGNOSIS — K644 Residual hemorrhoidal skin tags: Secondary | ICD-10-CM

## 2018-09-20 MED ORDER — TRAMADOL HCL 50 MG PO TABS: 50.0000 mg | ORAL_TABLET | Freq: Four times a day (QID) | ORAL | 0 refills | Status: AC | PRN

## 2018-09-20 MED ORDER — HYDROCORTISONE ACETATE 25 MG RE SUPP: 25.0000 mg | Freq: Two times a day (BID) | RECTAL | 0 refills | Status: DC

## 2018-09-20 NOTE — Patient Instructions (Signed)
For probable hemorrhoids, I did send in rx of annusol hc suppository, advised sitz bath and gave limited number of tramadol to help with severe pain until med starts to take effect,  If area does not improve or worsens by Friday let me know. In that event could refer to specialist.  Follow up as needed

## 2018-09-20 NOTE — Progress Notes (Signed)
Subjective:    Patient ID: Tina Frost, female    DOB: 1970-09-08, 48 y.o.   MRN: [id number]  HPI  Virtual Visit via Video Note  I connected with Tina Frost on 09/20/18 at  2:00 PM EDT by a video enabled telemedicine application and verified that I am speaking with the correct person using two identifiers.  Location: Patient: home Provider: office.     I discussed the limitations of evaluation and management by telemedicine and the availability of in person appointments. The patient expressed understanding and agreed to proceed.  History of Present Illness:   Place has history of hemorrhoid  Flare past week. She states has had in past but this time worse. She has use prep H and it did not help. Pt thinks used anusol hc in the past. Pt states area hurts to sit. About 8 level pain. No bleeding. Pt estimates medium size. When pt looks at area she states hemorrhoid looks pinkish red but not thrombosed appearance.  Observations/Objective: General-no acute distress, pleasant, oriented. Lungs- on inspection lungs appear unlabored. Neck- no tracheal deviation or jvd on inspection. Neuro- gross motor function appears intact. No exam of hemorrhoids.  Assessment and Plan: For probable hemorrhoids, I did send in rx of annusol hc suppository, advised sitz bath and gave limited number of tramadol to help with severe pain until med starts to take effect,  If area does not improve or worsens by Friday let me know. In that event could refer to specialist.  Follow up as needed  Follow Up Instructions:    I discussed the assessment and treatment plan with the patient. The patient was provided an opportunity to ask questions and all were answered. The patient agreed with the plan and demonstrated an understanding of the instructions.   The patient was advised to call back or seek an in-person evaluation if the symptoms worsen or if the condition fails to improve as anticipated.  I  provided 15  minutes of non-face-to-face time during this encounter.   Mackie Pai, PA-C    Review of Systems  Constitutional: Negative for chills and fatigue.  Respiratory: Negative for chest tightness, shortness of breath, wheezing and stridor.   Cardiovascular: Negative for chest pain and palpitations.  Gastrointestinal: Negative for abdominal distention, abdominal pain, diarrhea, nausea and vomiting.       Hemorrhoids described.  Musculoskeletal: Negative for back pain.  Skin: Negative for rash.  Neurological: Negative for dizziness and headaches.  Hematological: Negative for adenopathy. Does not bruise/bleed easily.  Psychiatric/Behavioral: Negative for behavioral problems and confusion.   Past Medical History:  Diagnosis Date   Abdominal pain    Anxiety    Atypical nevus 03/17/2017   right calf   Depression    History of kidney stones    Hyperlipidemia      Social History   Socioeconomic History   Marital status: Married    Spouse name: Not on file   Number of children: 3   Years of education: Not on file   Highest education level: Not on file  Occupational History   Occupation: Owns [employer] strain: Not on file   Food insecurity    Worry: Not on file    Inability: Not on file   Transportation needs    Medical: Not on file    Non-medical: Not on file  Tobacco Use   Smoking status: Never Smoker   Smokeless tobacco: Never Used  Substance and Sexual Activity   Alcohol use: No   Drug use: No   Sexual activity: Yes    Partners: Male  Lifestyle   Physical activity    Days per week: Not on file    Minutes per session: Not on file   Stress: Not on file  Relationships   Social connections    Talks on phone: Not on file    Gets together: Not on file    Attends religious service: Not on file    Active member of club or organization: Not on file    Attends meetings of clubs or organizations: Not  on file    Relationship status: Not on file   Intimate partner violence    Fear of current or ex partner: Not on file    Emotionally abused: Not on file    Physically abused: Not on file    Forced sexual activity: Not on file  Other Topics Concern   Not on file  Social History Narrative   Gets reg exercise    Past Surgical History:  Procedure Laterality Date   APPENDECTOMY     COLONOSCOPY  2002   HERNIA REPAIR  01/2015   INSERTION OF MESH N/A 02/11/2015   Procedure: INSERTION OF MESH;  Surgeon: Ralene Ok, MD;  Location: [facility];  Service: General;  Laterality: N/A;   KIDNEY STONE SURGERY  2008   TUBAL LIGATION     UMBILICAL HERNIA REPAIR N/A 02/11/2015   Procedure: LAPAROSCOPIC UMBILICAL HERNIA REPAIR WITH MESH;  Surgeon: Ralene Ok, MD;  Location: [facility];  Service: General;  Laterality: N/A;    Family History  Problem Relation Age of Onset   Breast cancer Mother    Lung cancer Paternal Grandmother    Stroke Father    Hypertension Other    Hyperlipidemia Other    Kidney disease Other        kidney stones   Alzheimer's disease Maternal Grandfather    Colon cancer Neg Hx    Rectal cancer Neg Hx    Esophageal cancer Neg Hx    Liver cancer Neg Hx     Allergies  Allergen Reactions   Amoxicillin Other (See Comments)    Causes kidney stones    Sulfa Antibiotics     Causes Kidney stones    Current Outpatient Medications on File Prior to Visit  Medication Sig Dispense Refill   ALPRAZolam (XANAX) 0.25 MG tablet Take 1 tablet (0.25 mg total) by mouth 3 (three) times daily as needed for anxiety. 30 tablet 0   DULoxetine (CYMBALTA) 60 MG capsule TAKE ONE CAPSULE BY MOUTH EVERY DAY 90 capsule 1   No current facility-administered medications on file prior to visit.     Ht 5\' 8"  (1.727 m)    Wt 180 lb (81.6 kg)    BMI 27.37 kg/m       Objective:   Physical Exam        Assessment & Plan:

## 2018-09-29 ENCOUNTER — Other Ambulatory Visit: Payer: Self-pay | Admitting: Family Medicine

## 2018-09-29 DIAGNOSIS — F411 Generalized anxiety disorder: Secondary | ICD-10-CM

## 2018-11-01 ENCOUNTER — Other Ambulatory Visit: Payer: Self-pay | Admitting: Family Medicine

## 2018-11-01 DIAGNOSIS — F419 Anxiety disorder, unspecified: Secondary | ICD-10-CM

## 2018-11-02 NOTE — Telephone Encounter (Signed)
Requesting: Xanax Contract: N/A UDS: N/A Last OV: 09/20/2018 Next OV: N/A Last Refill: 02/28/2018, #30--0 RF Database:   Please advise

## 2018-12-20 ENCOUNTER — Ambulatory Visit: Payer: BLUE CROSS/BLUE SHIELD | Admitting: Obstetrics and Gynecology

## 2019-01-02 ENCOUNTER — Telehealth: Payer: Self-pay

## 2019-01-02 ENCOUNTER — Other Ambulatory Visit: Payer: Self-pay

## 2019-01-02 NOTE — Telephone Encounter (Signed)
Copied from [place] [phone]. Topic: General - Other >> Jan 02, 2019  9:47 AM Carolyn Stare wrote: Pt req an appt for 11 10 2020 for bladder issue and med refill no answer at office

## 2019-01-02 NOTE — Telephone Encounter (Signed)
appt scheduled

## 2019-01-03 ENCOUNTER — Encounter: Payer: Self-pay | Admitting: Family Medicine

## 2019-01-03 ENCOUNTER — Other Ambulatory Visit: Payer: Self-pay

## 2019-01-03 ENCOUNTER — Ambulatory Visit (INDEPENDENT_AMBULATORY_CARE_PROVIDER_SITE_OTHER): Payer: BLUE CROSS/BLUE SHIELD | Admitting: Family Medicine

## 2019-01-03 VITALS — BP 126/86 | HR 88 | Temp 97.8°F | Resp 18 | Ht 68.0 in | Wt 192.8 lb

## 2019-01-03 DIAGNOSIS — F411 Generalized anxiety disorder: Secondary | ICD-10-CM

## 2019-01-03 DIAGNOSIS — R1032 Left lower quadrant pain: Secondary | ICD-10-CM | POA: Diagnosis not present

## 2019-01-03 DIAGNOSIS — R8290 Unspecified abnormal findings in urine: Secondary | ICD-10-CM

## 2019-01-03 LAB — POCT URINALYSIS DIP (MANUAL ENTRY)
Glucose, UA: NEGATIVE mg/dL
Leukocytes, UA: NEGATIVE
Nitrite, UA: NEGATIVE
Spec Grav, UA: >=1.03 — AB (ref 1.010–1.025)
Urobilinogen, UA: 0.2 E.U./dL
pH, UA: 6 (ref 5.0–8.0)

## 2019-01-03 MED ORDER — DULOXETINE HCL 60 MG PO CPEP: 60.0000 mg | ORAL_CAPSULE | Freq: Every day | ORAL | 1 refills | Status: DC

## 2019-01-03 NOTE — Progress Notes (Signed)
Patient ID: Tina Frost, female    DOB: Nov 20, 1970  Age: 48 y.o. MRN: [id number]    Subjective:  Subjective  HPI Tina Frost presents for L flank and LLQ pain.   Pt states it feels just like her kidney stones   Review of Systems  Constitutional: Negative for appetite change, diaphoresis, fatigue and unexpected weight change.  Eyes: Negative for pain, redness and visual disturbance.  Respiratory: Negative for cough, chest tightness, shortness of breath and wheezing.   Cardiovascular: Negative for chest pain, palpitations and leg swelling.  Endocrine: Negative for cold intolerance, heat intolerance, polydipsia, polyphagia and polyuria.  Genitourinary: Positive for flank pain. Negative for difficulty urinating, dysuria and frequency.  Neurological: Negative for dizziness, light-headedness, numbness and headaches.    History Past Medical History:  Diagnosis Date  . Abdominal pain   . Anxiety   . Atypical nevus 03/17/2017   right calf  . Depression   . History of kidney stones   . Hyperlipidemia     She has a past surgical history that includes Colonoscopy (2002); Appendectomy; Tubal ligation; Kidney stone surgery (2008); Umbilical hernia repair (N/A, 02/11/2015); Insertion of mesh (N/A, 02/11/2015); and Hernia repair (01/2015).   Her family history includes Alzheimer's disease in her maternal grandfather; Breast cancer in her mother; Hyperlipidemia in an other family member; Hypertension in an other family member; Kidney disease in an other family member; Lung cancer in her paternal grandmother; Stroke in her father.She reports that she has never smoked. She has never used smokeless tobacco. She reports that she does not drink alcohol or use drugs.  Current Outpatient Medications on File Prior to Visit  Medication Sig Dispense Refill  . ALPRAZolam (XANAX) 0.25 MG tablet TAKE ONE TABLET BY MOUTH THREE TIMES DAILY AS NEEDED FOR ANXIETY 30 tablet 0  . hydrocortisone (ANUSOL-HC)  25 MG suppository Place 1 suppository (25 mg total) rectally 2 (two) times daily. 20 suppository 0   No current facility-administered medications on file prior to visit.      Objective:  Objective  Physical Exam Nursing note reviewed.  Constitutional:      Appearance: She is well-developed.  HENT:     Head: Normocephalic and atraumatic.  Eyes:     Conjunctiva/sclera: Conjunctivae normal.  Neck:     Musculoskeletal: Normal range of motion and neck supple.     Thyroid: No thyromegaly.     Vascular: No carotid bruit or JVD.  Cardiovascular:     Rate and Rhythm: Normal rate and regular rhythm.     Heart sounds: Normal heart sounds. No murmur.  Pulmonary:     Effort: Pulmonary effort is normal. No respiratory distress.     Breath sounds: Normal breath sounds. No wheezing or rales.  Chest:     Chest wall: No tenderness.  Abdominal:     General: Abdomen is flat.     Palpations: Abdomen is soft.     Tenderness: There is abdominal tenderness in the left lower quadrant. There is no left CVA tenderness, guarding or rebound.  Neurological:     Mental Status: She is alert and oriented to person, place, and time.    BP 126/86 (BP Location: Right Arm, Patient Position: Sitting, Cuff Size: Normal)   Pulse 88   Temp 97.8 F (36.6 C) (Temporal)   Resp 18   Ht 5\' 8"  (1.727 m)   Wt 192 lb 12.8 oz (87.5 kg)   SpO2 95%   BMI 29.32 kg/m  Wt Readings from  Last 3 Encounters:  01/03/19 192 lb 12.8 oz (87.5 kg)  09/20/18 180 lb (81.6 kg)  05/03/18 189 lb 9.6 oz (86 kg)     Lab Results  Component Value Date   WBC 4.7 04/02/2017   HGB 15.9 (H) 04/02/2017   HCT 45.8 (H) 04/02/2017   PLT 252 04/02/2017   GLUCOSE 101 (H) 10/21/2015   CHOL 187 10/12/2011   TRIG 69.0 10/12/2011   HDL 57.90 10/12/2011   LDLCALC 115 (H) 10/12/2011   ALT 17 10/21/2015   AST 17 10/21/2015   NA 139 10/21/2015   K 3.5 10/21/2015   CL 107 10/21/2015   CREATININE 0.60 10/21/2015   BUN 8 10/21/2015   CO2  28 10/21/2015   TSH 0.62 04/02/2017    No results found.   Assessment & Plan:  Plan  I have changed Tina Frost. Tina Frost DULoxetine. I am also having her maintain her hydrocortisone and ALPRAZolam.  Meds ordered this encounter  Medications  . DULoxetine (CYMBALTA) 60 MG capsule    Sig: Take 1 capsule (60 mg total) by mouth daily.    Dispense:  90 capsule    Refill:  1    This prescription was filled on 09/29/2018. Any refills authorized will be placed on file.    Problem List Items Addressed This Visit    None    Visit Diagnoses    Abnormal urine    -  Primary   Relevant Orders   POCT urinalysis dipstick (Completed)   Urine Culture   Generalized anxiety disorder       Relevant Medications   DULoxetine (CYMBALTA) 60 MG capsule   Left lower quadrant abdominal pain       Relevant Orders   CT RENAL STONE STUDY    pt did not want any pain meds Strain urine Go to ER if pain increases   Follow-up: Return if symptoms worsen or fail to improve.  Ann Held, DO

## 2019-01-03 NOTE — Patient Instructions (Signed)

## 2019-01-04 ENCOUNTER — Ambulatory Visit (HOSPITAL_BASED_OUTPATIENT_CLINIC_OR_DEPARTMENT_OTHER)
Admission: RE | Admit: 2019-01-04 | Discharge: 2019-01-04 | Disposition: A | Discharge status: Home / Self Care | Payer: BLUE CROSS/BLUE SHIELD | Source: Ambulatory Visit | Attending: Family Medicine | Admitting: Family Medicine

## 2019-01-04 DIAGNOSIS — R1032 Left lower quadrant pain: Secondary | ICD-10-CM | POA: Insufficient documentation

## 2019-01-05 ENCOUNTER — Other Ambulatory Visit: Payer: Self-pay | Admitting: Family Medicine

## 2019-01-05 DIAGNOSIS — N390 Urinary tract infection, site not specified: Secondary | ICD-10-CM

## 2019-01-05 LAB — URINE CULTURE
MICRO NUMBER:: 1084228
SPECIMEN QUALITY:: ADEQUATE

## 2019-01-05 MED ORDER — CIPROFLOXACIN HCL 250 MG PO TABS: 250.0000 mg | ORAL_TABLET | Freq: Two times a day (BID) | ORAL | 0 refills | Status: DC

## 2019-02-13 ENCOUNTER — Other Ambulatory Visit: Payer: Self-pay

## 2019-02-13 ENCOUNTER — Encounter: Payer: Self-pay | Admitting: Family Medicine

## 2019-02-13 ENCOUNTER — Ambulatory Visit: Payer: BLUE CROSS/BLUE SHIELD | Admitting: Family Medicine

## 2019-02-13 ENCOUNTER — Ambulatory Visit (HOSPITAL_BASED_OUTPATIENT_CLINIC_OR_DEPARTMENT_OTHER)
Admission: RE | Admit: 2019-02-13 | Discharge: 2019-02-13 | Disposition: A | Discharge status: Home / Self Care | Payer: BLUE CROSS/BLUE SHIELD | Source: Ambulatory Visit | Attending: Family Medicine | Admitting: Family Medicine

## 2019-02-13 VITALS — BP 130/90 | HR 72 | Temp 97.3°F | Resp 18 | Ht 68.0 in | Wt 198.4 lb

## 2019-02-13 DIAGNOSIS — M79604 Pain in right leg: Secondary | ICD-10-CM | POA: Diagnosis not present

## 2019-02-13 DIAGNOSIS — R600 Localized edema: Secondary | ICD-10-CM | POA: Insufficient documentation

## 2019-02-13 DIAGNOSIS — E538 Deficiency of other specified B group vitamins: Secondary | ICD-10-CM | POA: Diagnosis not present

## 2019-02-13 DIAGNOSIS — F419 Anxiety disorder, unspecified: Secondary | ICD-10-CM | POA: Diagnosis not present

## 2019-02-13 DIAGNOSIS — M25471 Effusion, right ankle: Secondary | ICD-10-CM

## 2019-02-13 DIAGNOSIS — R5383 Other fatigue: Secondary | ICD-10-CM | POA: Diagnosis not present

## 2019-02-13 LAB — CBC WITH DIFFERENTIAL/PLATELET
Basophils Absolute: 0.1 10*3/uL (ref 0.0–0.1)
Basophils Relative: 1.3 % (ref 0.0–3.0)
Eosinophils Absolute: 0.2 10*3/uL (ref 0.0–0.7)
Eosinophils Relative: 3.6 % (ref 0.0–5.0)
HCT: 46.7 % — ABNORMAL HIGH (ref 36.0–46.0)
Hemoglobin: 15.9 g/dL — ABNORMAL HIGH (ref 12.0–15.0)
Lymphocytes Relative: 31.1 % (ref 12.0–46.0)
Lymphs Abs: 2.2 10*3/uL (ref 0.7–4.0)
MCHC: 34 g/dL (ref 30.0–36.0)
MCV: 93.4 fl (ref 78.0–100.0)
Monocytes Absolute: 0.4 10*3/uL (ref 0.1–1.0)
Monocytes Relative: 6 % (ref 3.0–12.0)
Neutro Abs: 4 10*3/uL (ref 1.4–7.7)
Neutrophils Relative %: 58 % (ref 43.0–77.0)
Platelets: 259 10*3/uL (ref 150.0–400.0)
RBC: 4.99 Mil/uL (ref 3.87–5.11)
RDW: 13.5 % (ref 11.5–15.5)
WBC: 6.9 10*3/uL (ref 4.0–10.5)

## 2019-02-13 LAB — COMPREHENSIVE METABOLIC PANEL
ALT: 14 U/L (ref 0–35)
AST: 15 U/L (ref 0–37)
Albumin: 4.1 g/dL (ref 3.5–5.2)
Alkaline Phosphatase: 58 U/L (ref 39–117)
BUN: 10 mg/dL (ref 6–23)
CO2: 26 mEq/L (ref 19–32)
Calcium: 9 mg/dL (ref 8.4–10.5)
Chloride: 106 mEq/L (ref 96–112)
Creatinine, Ser: 0.64 mg/dL (ref 0.40–1.20)
GFR: 99.57 mL/min (ref >60.00)
Glucose, Bld: 71 mg/dL (ref 70–99)
Potassium: 3.8 mEq/L (ref 3.5–5.1)
Sodium: 140 mEq/L (ref 135–145)
Total Bilirubin: 0.7 mg/dL (ref 0.2–1.2)
Total Protein: 6.7 g/dL (ref 6.0–8.3)

## 2019-02-13 LAB — VITAMIN B12: Vitamin B-12: 924 pg/mL — ABNORMAL HIGH (ref 211–911)

## 2019-02-13 LAB — URIC ACID: Uric Acid, Serum: 3.9 mg/dL (ref 2.4–7.0)

## 2019-02-13 LAB — TSH: TSH: 0.91 u[IU]/mL (ref 0.35–4.50)

## 2019-02-13 MED ORDER — FUROSEMIDE 20 MG PO TABS: 20.0000 mg | ORAL_TABLET | Freq: Every day | ORAL | 3 refills | Status: DC

## 2019-02-13 MED ORDER — ALPRAZOLAM 0.25 MG PO TABS: 0.2500 mg | ORAL_TABLET | Freq: Three times a day (TID) | ORAL | 0 refills | Status: DC | PRN

## 2019-02-13 NOTE — Progress Notes (Signed)
Patient ID: Tina Frost, female    DOB: 1970-08-21  Age: 48 y.o. MRN: [id number]    Subjective:  Subjective  HPI  Tina Frost presents for swelling in ankles b/l  R >L   Pain in R leg with walking   Review of Systems  Constitutional: Negative for appetite change, diaphoresis, fatigue and unexpected weight change.  Eyes: Negative for pain, redness and visual disturbance.  Respiratory: Negative for cough, chest tightness, shortness of breath and wheezing.   Cardiovascular: Positive for leg swelling. Negative for chest pain and palpitations.  Endocrine: Negative for cold intolerance, heat intolerance, polydipsia, polyphagia and polyuria.  Genitourinary: Negative for difficulty urinating, dysuria and frequency.  Musculoskeletal: Negative for arthralgias and joint swelling.  Neurological: Negative for dizziness, light-headedness, numbness and headaches.    History Past Medical History:  Diagnosis Date  . Abdominal pain   . Anxiety   . Atypical nevus 03/17/2017   right calf  . Depression   . History of kidney stones   . Hyperlipidemia     She has a past surgical history that includes Colonoscopy (2002); Appendectomy; Tubal ligation; Kidney stone surgery (2008); Umbilical hernia repair (N/A, 02/11/2015); Insertion of mesh (N/A, 02/11/2015); and Hernia repair (01/2015).   Her family history includes Alzheimer's disease in her maternal grandfather; Breast cancer in her mother; Hyperlipidemia in an other family member; Hypertension in an other family member; Kidney disease in an other family member; Lung cancer in her paternal grandmother; Stroke in her father.She reports that she has never smoked. She has never used smokeless tobacco. She reports that she does not drink alcohol or use drugs.  Current Outpatient Medications on File Prior to Visit  Medication Sig Dispense Refill  . DULoxetine (CYMBALTA) 60 MG capsule Take 1 capsule (60 mg total) by mouth daily. 90 capsule 1  .  hydrocortisone (ANUSOL-HC) 25 MG suppository Place 1 suppository (25 mg total) rectally 2 (two) times daily. 20 suppository 0   No current facility-administered medications on file prior to visit.     Objective:  Objective  Physical Exam Vitals and nursing note reviewed.  Constitutional:      Appearance: She is well-developed.  HENT:     Head: Normocephalic and atraumatic.  Eyes:     Conjunctiva/sclera: Conjunctivae normal.  Neck:     Thyroid: No thyromegaly.     Vascular: No carotid bruit or JVD.  Cardiovascular:     Rate and Rhythm: Normal rate and regular rhythm.     Heart sounds: Normal heart sounds. No murmur.  Pulmonary:     Effort: Pulmonary effort is normal. No respiratory distress.     Breath sounds: Normal breath sounds. No wheezing or rales.  Chest:     Chest wall: No tenderness.  Musculoskeletal:        General: Swelling present.     Cervical back: Normal range of motion and neck supple.     Right ankle: Swelling present. Tenderness present.     Left ankle: Swelling present. No tenderness.       Legs:  Neurological:     Mental Status: She is alert and oriented to person, place, and time.    BP 130/90 (BP Location: Right Arm, Patient Position: Sitting, Cuff Size: Normal)   Pulse 72   Temp (!) 97.3 F (36.3 C) (Temporal)   Resp 18   Ht 5\' 8"  (1.727 m)   Wt 198 lb 6.4 oz (90 kg)   SpO2 99%   BMI 30.17 kg/m  Wt Readings from Last 3 Encounters:  02/13/19 198 lb 6.4 oz (90 kg)  01/03/19 192 lb 12.8 oz (87.5 kg)  09/20/18 180 lb (81.6 kg)     Lab Results  Component Value Date   WBC 6.9 02/13/2019   HGB 15.9 (H) 02/13/2019   HCT 46.7 (H) 02/13/2019   PLT 259.0 02/13/2019   GLUCOSE 71 02/13/2019   CHOL 187 10/12/2011   TRIG 69.0 10/12/2011   HDL 57.90 10/12/2011   LDLCALC 115 (H) 10/12/2011   ALT 14 02/13/2019   AST 15 02/13/2019   NA 140 02/13/2019   K 3.8 02/13/2019   CL 106 02/13/2019   CREATININE 0.64 02/13/2019   BUN 10 02/13/2019   CO2  26 02/13/2019   TSH 0.91 02/13/2019    CT RENAL STONE STUDY  Result Date: 01/04/2019 CLINICAL DATA:  48 year old female with history of bilateral flank pain (left greater than right) for the past 2 weeks with hematuria over the past several days. EXAM: CT ABDOMEN AND PELVIS WITHOUT CONTRAST TECHNIQUE: Multidetector CT imaging of the abdomen and pelvis was performed following the standard protocol without IV contrast. COMPARISON:  CT the abdomen and pelvis 08/03/2001. FINDINGS: Lower chest: Unremarkable. Hepatobiliary: In segment 4A of the liver there is a 1.8 x 1.0 cm intermediate attenuation lesion which is incompletely characterized on today's non-contrast CT examination, but was partially visualized on prior study from 2013 and appear similar, presumably a benign lesions such as a mildly proteinaceous cyst. No other definite suspicious hepatic lesions are confidently identified on today's noncontrast CT examination. Unenhanced appearance of the gallbladder is normal. Pancreas: No definite pancreatic mass or peripancreatic fluid collections or inflammatory changes are noted on today's noncontrast CT examination. Spleen: Unremarkable. Adrenals/Urinary Tract: Tiny calcifications associated with the collecting systems of the kidneys bilaterally measuring only 1 mm in size, which likely reflect nonobstructive calculi. No additional calcifications are identified along the course of either ureter or within the lumen of the urinary bladder. No hydroureteronephrosis. Unenhanced appearance of the kidneys and bilateral adrenal glands is otherwise unremarkable. Urinary bladder is normal in appearance. Stomach/Bowel: Unenhanced appearance of the stomach is normal. No pathologic dilatation of small bowel or colon. Status post appendectomy Vascular/Lymphatic: No atherosclerotic calcifications in the abdominal aorta or pelvic vasculature. No lymphadenopathy noted in the abdomen or pelvis. Reproductive: IUD present in the  uterus which appears appropriately located. Unenhanced appearance of the uterus and ovaries is otherwise unremarkable. Other: No significant volume of ascites.  No pneumoperitoneum. Musculoskeletal: There are no aggressive appearing lytic or blastic lesions noted in the visualized portions of the skeleton. IMPRESSION: 1. Tiny 1 mm calcifications in both renal collecting systems, likely to represent tiny nonobstructive calculi. No ureteral stones or findings of urinary tract obstruction are noted at this time. 2. Additional incidental findings, as above. Electronically Signed   By: Vinnie Langton M.D.   On: 01/04/2019 11:06     Assessment & Plan:  Plan  I have discontinued [name]. Gorman's ciprofloxacin. I have also changed her ALPRAZolam. Additionally, I am having her start on furosemide. Lastly, I am having her maintain her hydrocortisone and DULoxetine.  Meds ordered this encounter  Medications  . ALPRAZolam (XANAX) 0.25 MG tablet    Sig: Take 1 tablet (0.25 mg total) by mouth 3 (three) times daily as needed. for anxiety    Dispense:  30 tablet    Refill:  0  . furosemide (LASIX) 20 MG tablet    Sig: Take 1 tablet (20  mg total) by mouth daily.    Dispense:  30 tablet    Refill:  3    Problem List Items Addressed This Visit      Unprioritized   Lower extremity edema - Primary   Relevant Medications   furosemide (LASIX) 20 MG tablet   Other Relevant Orders   DG Ankle Complete Right   Comprehensive metabolic panel (Completed)   CBC with Differential (Completed)   Uric Acid (Completed)    Other Visit Diagnoses    Anxiety       Relevant Medications   ALPRAZolam (XANAX) 0.25 MG tablet   Right leg pain       Relevant Orders   VAS [place] ABI WITH/WO TBI   Right ankle swelling       Relevant Orders   DG Ankle Complete Right   Comprehensive metabolic panel (Completed)   CBC with Differential (Completed)   Uric Acid (Completed)   B12 deficiency       Relevant Orders   CBC with  Differential (Completed)   Vitamin B12 (Completed)   Fatigue, unspecified type       Relevant Orders   TSH (Completed)      Follow-up: Return if symptoms worsen or fail to improve.  Ann Held, DO

## 2019-02-13 NOTE — Patient Instructions (Signed)

## 2019-02-16 ENCOUNTER — Ambulatory Visit (HOSPITAL_BASED_OUTPATIENT_CLINIC_OR_DEPARTMENT_OTHER)
Admission: RE | Admit: 2019-02-16 | Discharge: 2019-02-16 | Disposition: A | Discharge status: Home / Self Care | Payer: BLUE CROSS/BLUE SHIELD | Source: Ambulatory Visit | Attending: Family Medicine | Admitting: Family Medicine

## 2019-02-16 ENCOUNTER — Other Ambulatory Visit: Payer: Self-pay

## 2019-02-16 DIAGNOSIS — M79604 Pain in right leg: Secondary | ICD-10-CM | POA: Insufficient documentation

## 2019-02-16 NOTE — Progress Notes (Signed)
VAS [place] ABI WITH/WO TBI      02/16/19 Cardell Peach RDCS, RVT

## 2019-02-18 ENCOUNTER — Encounter: Payer: Self-pay | Admitting: Family Medicine

## 2019-02-20 ENCOUNTER — Other Ambulatory Visit: Payer: Self-pay | Admitting: Family Medicine

## 2019-02-20 DIAGNOSIS — M79606 Pain in leg, unspecified: Secondary | ICD-10-CM

## 2019-02-20 NOTE — Telephone Encounter (Signed)
I want to get some more labs --- rheum factor, esr ana Refer to [facility]

## 2019-03-02 ENCOUNTER — Ambulatory Visit (INDEPENDENT_AMBULATORY_CARE_PROVIDER_SITE_OTHER): Payer: BLUE CROSS/BLUE SHIELD | Admitting: Family Medicine

## 2019-03-02 ENCOUNTER — Encounter: Payer: Self-pay | Admitting: Family Medicine

## 2019-03-02 ENCOUNTER — Other Ambulatory Visit (INDEPENDENT_AMBULATORY_CARE_PROVIDER_SITE_OTHER): Payer: BLUE CROSS/BLUE SHIELD

## 2019-03-02 ENCOUNTER — Other Ambulatory Visit: Payer: Self-pay

## 2019-03-02 ENCOUNTER — Ambulatory Visit (INDEPENDENT_AMBULATORY_CARE_PROVIDER_SITE_OTHER): Payer: BLUE CROSS/BLUE SHIELD

## 2019-03-02 VITALS — BP 148/98 | HR 86 | Ht 68.0 in | Wt 201.6 lb

## 2019-03-02 DIAGNOSIS — M25572 Pain in left ankle and joints of left foot: Secondary | ICD-10-CM

## 2019-03-02 DIAGNOSIS — M25571 Pain in right ankle and joints of right foot: Secondary | ICD-10-CM | POA: Diagnosis not present

## 2019-03-02 DIAGNOSIS — M79606 Pain in leg, unspecified: Secondary | ICD-10-CM

## 2019-03-02 DIAGNOSIS — M25562 Pain in left knee: Secondary | ICD-10-CM | POA: Diagnosis not present

## 2019-03-02 DIAGNOSIS — G8929 Other chronic pain: Secondary | ICD-10-CM

## 2019-03-02 DIAGNOSIS — M7989 Other specified soft tissue disorders: Secondary | ICD-10-CM

## 2019-03-02 NOTE — Patient Instructions (Addendum)
Thank you for coming in today. Get xray of your left knee on the way out.  Use voltaren gel up to 4x daily for pain.  Use body helix full ankle sleeve.  Do the exercises we discussed.  I will get results of MRI to you asap.  Plan to follow up after MRI.   Please perform the exercise program that we have prepared for you and gone over in detail on a daily basis.  In addition to the handout you were provided you can access your program through: [email or web address]   Your unique program code is: [email or web address] and  [facility]

## 2019-03-02 NOTE — Progress Notes (Signed)
Subjective:    I'm seeing this patient as a consultation for:  Dr. Carollee Herter. Note will be routed back to referring provider/PCP.  CC: B ankle swelling and R leg pain and L knee  I, Molly Weber, LAT, ATC, am serving as scribe for Dr. Lynne Leader.  HPI: Pt is a 49 y/o female presenting w/ c/o B ankle swelling (R>L) and R leg pain x approximately one year.  She saw her PCP on 02/13/19 for these issues and had a R ankle x-ray.  She was prescribed Lasix 20 mg and referred for an ABI study that was completed on 02/16/19.  Since her last visit w/ her PCP, pt reports that her symptoms are about the same.  R ankle swelling is worse than the L.  She locates her B ankle pain to the lateral malleolus.  She rates her B ankle pain at a 4-5/10 w/ aggravating activities.  Her B ankle pain is aggravated w/ ankle eversion and with weight bearing.  She denies any mechanical ankle symptoms.  She does note tingling in her R foot.    L knee:  Pt reports L patellar pain that is achy.  Aggravating factors include ascending stairs.  She denies any mechanical symptoms or radiating pain.  Pt rates her pain at at 4-5/10 w/ ascending stairs.    Past medical history, Surgical history, Family history, Social history, Allergies, and medications have been entered into the medical record, reviewed.   Review of Systems: No headache, visual changes, nausea, vomiting, diarrhea, constipation, dizziness, abdominal pain, skin rash, fevers, chills, night sweats, weight loss, swollen lymph nodes, body aches, joint swelling, muscle aches, chest pain, shortness of breath, mood changes, visual or auditory hallucinations.   Objective:    Vitals:   03/02/19 1102  BP: (!) 148/98  Pulse: 86  SpO2: 99%   General: Well Developed, well nourished, and in no acute distress.  Neuro/Psych: Alert and oriented x3, extra-ocular muscles intact, able to move all 4 extremities, sensation grossly intact. Skin: Warm and dry, no rashes noted.   Respiratory: Not using accessory muscles, speaking in full sentences, trachea midline.  Cardiovascular: Pulses palpable, no extremity edema. Abdomen: Does not appear distended. MSK:  Right ankle: Swollen with mild effusion most dominantly laterally.  Otherwise normal-appearing with no erythema.  No significant Swelling or edema. Normal ankle motion. Normal strength however some pain is present with resisted ankle eversion. Stable ligamentous exam. Pulses cap refill and sensation are intact into the foot.  Left knee: No significant effusion. Range of motion 0-120 degrees with retropatellar crepitation. Nontender. Stable ligamentous exam. Negative Murray's testing. Intact flexion extension strength.  Lab and Radiology Results  X-ray images left knee obtained today personally and independently reviewed Minimal Patellofemoral DJD. No acute findings.  Await formal radiology review  Limited musculoskeletal ultrasound right ankle: Moderate effusion present at anterior lateral and lateral aspect of ankle joint. Intact peroneal tendons with no significant defect or tenosynovitis present. Normal-appearing medial ankle structures including posterior tibialis tendon. Normal bony structures. Impression: Moderate ankle effusion.  EXAM: RIGHT ANKLE - COMPLETE 3+ VIEW  02/13/19  COMPARISON:  None.  FINDINGS: No evidence of fracture or dislocation. No evidence of arthropathy or other bone abnormality. Diffuse soft tissue swelling is noted, greatest along the lateral aspect of the ankle joint.  IMPRESSION: Soft tissue swelling.  No osseous abnormality.   Electronically Signed   By: Marlaine Hind M.D.   On: 02/13/2019 20:32    Impression and Recommendations:  Assessment and Plan: 49 y.o. female with  Right ankle swelling with some pain.  Patient has had symptoms ongoing for over a year and has had multiple attempts to treat for greater than 6 weeks with her primary care  provider with little benefit. Physical exam and ultrasound examination show ankle effusion however x-ray and ultrasound examination today did not show obvious interarticular cause.  Discussed treatment plan and options.  Plan to add a bit more conservative management including compressive ankle sleeve, and Voltaren gel as well as some home exercise program.  However will further evaluate because of ankle swelling with noncontrast MRI of right ankle. Check back following MRI.  Additionally patient has left anterior knee pain.  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  Discussed treatment plan and options.  Again treat with Voltaren gel.  Also will work on quad strengthening exercises and recommend some weight loss as well.    Orders Placed This Encounter  Procedures  . [place] - LOWER Extremity - Limited - RIGHT    Order Specific Question:   Reason for Exam (SYMPTOM  OR DIAGNOSIS REQUIRED)    Answer:   B ankle pain    Order Specific Question:   Preferred imaging location?    Answer:   [place]  . DG Knee 4 Views W/Patella Left    Standing Status:   Future    Standing Expiration Date:   04/29/2020    Order Specific Question:   Reason for Exam (SYMPTOM  OR DIAGNOSIS REQUIRED)    Answer:   eval left knee pain suspect patellofemoral chrondromalacia    Order Specific Question:   Is patient pregnant?    Answer:   No    Order Specific Question:   Preferred imaging location?    Answer:   [facility]    Order Specific Question:   Radiology Contrast Protocol - do NOT remove file path    Answer:   \\[email or web address]  . MR ANKLE RIGHT WO CONTRAST    Standing Status:   Future    Standing Expiration Date:   04/29/2020    Order Specific Question:   What is the patient's sedation requirement?    Answer:   No Sedation    Order Specific Question:   Does the patient have a pacemaker or implanted devices?    Answer:   No    Order  Specific Question:   Preferred imaging location?    Answer:   [facility] (table limit-350lbs)    Order Specific Question:   Radiology Contrast Protocol - do NOT remove file path    Answer:   \\[email or web address]   No orders of the defined types were placed in this encounter.   Discussed warning signs or symptoms. Please see discharge instructions. Patient expresses understanding.   The above documentation has been reviewed and is accurate and complete Lynne Leader

## 2019-03-03 LAB — ANA: Anti Nuclear Antibody (ANA): NEGATIVE

## 2019-03-03 LAB — RHEUMATOID FACTOR: Rheumatoid fact SerPl-aCnc: <14 IU/mL (ref <14)

## 2019-03-03 LAB — URIC ACID: Uric Acid, Serum: 4.2 mg/dL (ref 2.4–7.0)

## 2019-03-03 LAB — SEDIMENTATION RATE: Sed Rate: 1 mm/hr (ref 0–20)

## 2019-03-03 NOTE — Progress Notes (Signed)
Xray knee shows mild arthritis but not severe.

## 2019-03-13 ENCOUNTER — Other Ambulatory Visit: Payer: Self-pay

## 2019-03-13 ENCOUNTER — Ambulatory Visit (INDEPENDENT_AMBULATORY_CARE_PROVIDER_SITE_OTHER): Payer: BLUE CROSS/BLUE SHIELD

## 2019-03-13 DIAGNOSIS — M25571 Pain in right ankle and joints of right foot: Secondary | ICD-10-CM | POA: Diagnosis not present

## 2019-03-13 DIAGNOSIS — G8929 Other chronic pain: Secondary | ICD-10-CM | POA: Diagnosis not present

## 2019-03-14 NOTE — Progress Notes (Signed)
MRI of ankle shows some tendinitis of the tendons on the lateral/outside part of the ankle.  Additionally you have arthritis in part of the ankle joint and if possible that a little extra bone called the os trigonum could be irritating the ankle as well.  Recommend recheck in clinic in person in the near future to discuss the MRI findings in further detail and discuss our next steps.

## 2019-03-16 ENCOUNTER — Ambulatory Visit (INDEPENDENT_AMBULATORY_CARE_PROVIDER_SITE_OTHER): Payer: Self-pay | Admitting: Family Medicine

## 2019-03-16 ENCOUNTER — Encounter: Payer: Self-pay | Admitting: Family Medicine

## 2019-03-16 ENCOUNTER — Other Ambulatory Visit: Payer: Self-pay

## 2019-03-16 VITALS — BP 130/80 | HR 77 | Ht 68.0 in | Wt 198.0 lb

## 2019-03-16 DIAGNOSIS — Q688 Other specified congenital musculoskeletal deformities: Secondary | ICD-10-CM

## 2019-03-16 DIAGNOSIS — M7671 Peroneal tendinitis, right leg: Secondary | ICD-10-CM | POA: Insufficient documentation

## 2019-03-16 DIAGNOSIS — M19071 Primary osteoarthritis, right ankle and foot: Secondary | ICD-10-CM

## 2019-03-16 HISTORY — DX: Peroneal tendinitis, right leg: M76.71

## 2019-03-16 HISTORY — DX: Primary osteoarthritis, right ankle and foot: M19.071

## 2019-03-16 HISTORY — DX: Other specified congenital musculoskeletal deformities: Q68.8

## 2019-03-16 NOTE — Patient Instructions (Signed)
Thank you for coming in today. Continue the ankle sleeve with activity.  Continue exercises.  Add voltaren gel.  If not doing well next step is cortisone shot in the subtalar ankle joint.  Let me know if you are not doing well or we need to more or explore other options.    Posterior Ankle Impingement Rehab Ask your health care provider which exercises are safe for you. Do exercises exactly as told by your health care provider and adjust them as directed. It is normal to feel mild stretching, pulling, tightness, or discomfort as you do these exercises. Stop right away if you feel sudden pain or your pain gets worse. Do not begin these exercises until told by your health care provider. Stretching and range-of-motion exercises These exercises warm up your muscles and joints and improve the movement and flexibility of your ankle. These exercises also help to relieve pain, numbness, and tingling. Toe extension/toe flexion stretch  1. Sit with your left / right leg crossed over your opposite knee. 2. With your left / right hand, hold your toes and gently pull them toward the top of your foot (flexion). You should feel a stretch at the bottom of your toes and foot. 3. Hold this stretch for __________ seconds. 4. Gently pull your toes toward the bottom of your foot (extension). You should feel a stretch on the top of your toes and foot. 5. Hold this stretch for __________ seconds. Repeat __________ times. Complete this exercise __________ times a day. Ankle dorsiflexion, active-assisted  1. Sit on a chair that is placed on a non-carpeted surface. 2. Place your left / right foot on the floor, directly under your knee. Extend your other leg for support. 3. Keeping your heel down, slide your left / right foot back toward the chair until you feel a stretch at your ankle or the back of your lower leg (calf). The toes will move closer to your shin (dorsiflexion). If you do not feel a stretch, slide your  buttocks forward to the edge of the chair while keeping your heel down. 4. Hold this stretch for __________ seconds. Repeat __________ times. Complete this exercise __________ times a day. Gastroc stretch, standing  1. Stand with your hands against a wall. 2. Extend your left / right leg behind you, and bend your front knee slightly. Your heels should be on the floor. 3. Point the toes on your back foot slightly inward. 4. Keeping your heels on the floor and your back knee straight, shift your weight toward the wall. Do not arch your back. You should feel a gentle stretch in your upper calf (gastrocnemius). 5. Hold this position for __________ seconds. Repeat __________ times. Complete this exercise __________ times a day. Soleus stretch, standing 1. Stand with your hands against a wall. 2. Extend your left / right leg behind you, and bend your front knee slightly. Your heels should be on the floor. 3. Point the toes on your back foot slightly inward. 4. Keeping your heels on the floor, bend your back knee and slightly shift your weight over the back leg. You should feel a gentle stretch deep in your lower calf (soleus). 5. Hold this position for __________ seconds. Repeat __________ times. Complete this exercise __________ times a day. Gastroc and soleus stretch, standing  1. Stand on the edge of a step on the ball of your left / right foot. The ball of your foot is on the walking surface, right under your toes. 2.  Keep your other foot firmly on the same step. 3. Hold on to the wall, a railing, or a chair for balance. 4. Slowly lift your uninjured foot, allowing your body weight to press your left / right heel down over the edge of the step. You should feel a stretch in your left / right calf (gastrocnemius and soleus). 5. Hold this position for __________ seconds. 6. Return both feet to the step. 7. Repeat this exercise with a slight bend in your left / right knee. Repeat __________  times with your left / right knee straight and __________ times with your left / right knee bent. Complete this exercise __________ times a day. Strengthening exercises These exercises build strength and endurance in your ankle. Endurance is the ability to use your muscles for a long time, even after they get tired. Plantar flexion with band, seated  1. Sit on the floor with your left / right leg extended. 2. Loop a rubber exercise band or tube around the ball of your left / right foot. The ball of your foot is on the walking surface, right under your toes. The band or tube should be slightly tense when your foot is relaxed. 3. While holding both ends of the band or tube, slowly point your toes downward, pushing them away from you (plantar flexion). Stop pushing your toes down if you have any pain. 4. Hold this position for __________ seconds. 5. Let the band or tube slowly pull your foot back to the starting position. Repeat __________ times. Complete this exercise __________ times a day. Plantar flexion, standing  1. Stand with your feet shoulder-width apart. 2. Place your hands on a wall or table to steady yourself as needed, but try not to use it for support. 3. Keep your weight spread evenly over the width of your feet while you slowly rise up on your toes (plantar flexion). Stop rising up on your toes if you have any pain. 4. If this exercise is too easy, try these options: ? Shift your weight toward your left / right leg until you feel challenged. ? If told by your health care provider, stand on your left / right foot only. 5. Hold this position for __________ seconds. Repeat __________ times. Complete this exercise __________ times a day. Towel curls  1. Sit in a chair on a non-carpeted surface, and put your feet on the floor. 2. Place a towel in front of your feet. If told by your health care provider, add a __________ weight to the end of the towel. 3. Keeping your heel on the  floor, put your left / right foot on the towel. 4. Pull the towel toward you by grabbing the towel with your toes and curling them under. Keep your heel on the floor while you do this. 5. Let your toes relax. 6. Grab the towel again. Keep pulling the towel until it is completely underneath your foot. Repeat __________ times. Complete this exercise __________ times a day. This information is not intended to replace advice given to you by your health care provider. Make sure you discuss any questions you have with your health care provider. Document Revised: 06/03/2018 Document Reviewed: 01/06/2018 Elsevier Patient Education  2020 [employer]..   Peroneal Tendinopathy Rehab Ask your health care provider which exercises are safe for you. Do exercises exactly as told by your health care provider and adjust them as directed. It is normal to feel mild stretching, pulling, tightness, or discomfort as you do  these exercises. Stop right away if you feel sudden pain or your pain gets worse. Do not begin these exercises until told by your health care provider. Stretching and range-of-motion exercises These exercises warm up your muscles and joints and improve the movement and flexibility of your ankle. These exercises also help to relieve pain and stiffness. Gastroc and soleus stretch, standing This is an exercise in which you stand on a step and use your body weight to stretch your calf muscles. To do this exercise: 1. Stand on the edge of a step on the ball of your left / right foot. The ball of your foot is on the walking surface, right under your toes. 2. Keep your other foot firmly on the same step. 3. Hold on to the wall, a railing, or a chair for balance. 4. Slowly lift your other foot, allowing your body weight to press your left / right heel down over the edge of the step. You should feel a stretch in your left / right calf (gastrocnemius and soleus). 5. Hold this position for __________  seconds. 6. Return both feet to the step. 7. Repeat this exercise with a slight bend in your left / right knee. Repeat __________ times with your left / right knee straight and __________ times with your left / right knee bent. Complete this exercise __________ times a day. Strengthening exercises These exercises build strength and endurance in your foot and ankle. Endurance is the ability to use your muscles for a long time, even after they get tired. Ankle dorsiflexion with band  1. Secure a rubber exercise band or tube to an object, such as a table leg, that will not move when the band is pulled. 2. Secure the other end of the band around your left / right foot. 3. Sit on the floor, facing the object with your left / right leg extended. The band or tube should be slightly tense when your foot is relaxed. 4. Slowly flex your left / right ankle and toes to bring your foot toward you (dorsiflexion). 5. Hold this position for __________ seconds. 6. Let the band or tube slowly pull your foot back to the starting position. Repeat __________ times. Complete this exercise __________ times a day. Ankle eversion 1. Sit on the floor with your legs straight out in front of you. 2. Loop a rubber exercise band or tube around the ball of your left / right foot. The ball of your foot is on the walking surface, right under your toes. 3. Hold the ends of the band in your hands, or secure the band to a stable object. The band or tube should be slightly tense when your foot is relaxed. 4. Slowly push your foot outward, away from your other leg (eversion). 5. Hold this position for __________ seconds. 6. Slowly return your foot to the starting position. Repeat __________ times. Complete this exercise __________ times a day. Plantar flexion, standing This exercise is sometimes called standing heel raise. 1. Stand with your feet shoulder-width apart. 2. Place your hands on a wall or table to steady yourself as  needed, but try not to use it for support. 3. Keep your weight spread evenly over the width of your feet while you slowly rise up on your toes (plantar flexion). If told by your health care provider: ? Shift your weight toward your left / right leg until you feel challenged. ? Stand on your left / right leg only. 4. Hold this position for  __________ seconds. Repeat __________ times. Complete this exercise __________ times a day. Single leg stand 1. Without shoes, stand near a railing or in a doorway. You may hold on to the railing or door frame as needed. 2. Stand on your left / right foot. Keep your big toe down on the floor and try to keep your arch lifted. ? Do not roll to the outside of your foot. ? If this exercise is too easy, you can try it with your eyes closed or while standing on a pillow. 3. Hold this position for __________ seconds. Repeat __________ times. Complete this exercise __________ times a day. This information is not intended to replace advice given to you by your health care provider. Make sure you discuss any questions you have with your health care provider. Document Revised: 05/31/2018 Document Reviewed: 05/31/2018 Elsevier Patient Education  [employer].

## 2019-03-16 NOTE — Progress Notes (Signed)
Rito Ehrlich, am serving as a Education administrator for Dr. Lynne Leader.  Tina Frost is a 49 y.o. female who presents to [place] at [facility] today for follow-up of right ankle pain and swelling.  Patient was seen for this issue on January 7.  During the first visit she had been having pain feeling attempts for treatment for greater than 6 weeks and ultrasound examination showed ankle effusion.  Plan for conservative management with compressive ankle sleeve Voltaren gel and home exercise program.  Additionally arrange for a noncontrast MRI scan.  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   She notes she had a bit of improvement in pain following home exercise program and compressive ankle sleeve.  She does not think her pain is bad enough that she wants to proceed with injection today.  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 Patient states both ankles are swollen and still painful. Exercises are helping has not gotten the voltaren gel yet.    Pertinent review of systems: No fevers or chills   Exam:  BP 130/80 (BP Location: Left Arm, Patient Position: Sitting, Cuff Size: Normal)   Pulse 77   Ht 5\' 8"  (1.727 m)   Wt 198 lb (89.8 kg)   LMP 02/16/2019   SpO2 98%   BMI 30.11 kg/m  General: Well Developed, well nourished, and in no acute distress.   MSK: Right ankle: Normal-appearing.  Mildly tender palpation anterior lateral aspect of ankle and mildly tender posterior lateral aspect of ankle.  Normal motion.    Lab and Radiology Results No results found for this or any previous visit (from the past 72 hour(s)). MR ANKLE RIGHT WO CONTRAST  Result Date: 03/13/2019 CLINICAL DATA:  Right ankle pain and swelling for 1 year. EXAM: MRI  OF THE RIGHT ANKLE WITHOUT CONTRAST TECHNIQUE: Multiplanar, multisequence MR imaging of the ankle was performed. No intravenous contrast was administered. COMPARISON:  None. FINDINGS: TENDONS Peroneal: Intact. There is a small volume of fluid in the sheaths of both tendons and mild intrasubstance increased T2 signal within the tendons consistent with tendinosis. Posteromedial: Intact. Anterior: Intact. Achilles: Intact. Plantar Fascia: Normal signal. LIGAMENTS Lateral: Intact. Medial: Intact. CARTILAGE Ankle Joint: Small joint effusion. No osteochondral lesion of the talar dome. Subtalar Joints/Sinus Tarsi: There is degenerative change at the posterior facet of the subtalar joint with associated mild edema. Bones: The patient has an os trigonum measuring 1 cm AP by 1.3 cm transverse by 0.3 cm craniocaudal. There is mild edema within the os and fluid in the posterior subtalar recess. Other: None. 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 Electronically Signed   By: Inge Rise M.D.   On: 03/13/2019 10:06  I, Lynne Leader, personally (independently) visualized and performed the interpretation of the images attached in this note.      Assessment and Plan: 49 y.o. female with right ankle pain.  Due to 3 main factors.  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  Discussed options.  Plan to continue with external exercise program Voltaren gel and compressive ankle sleeve.  If no better would proceed with injection.  Patient declined injection today.  Component of  peroneal tendinitis.  Seen on MRI.  Does explain some of her pain.  Treat with continued home exercise program compressive ankle sleeve and diclofenac gel.  If no better would consider injection of this area if needed.  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  However if no  better would consider injection or even referral to surgery for surgical resection however that does not appear to be the predominant cause of her pain today.  After detailed discussion recheck back as needed.   Total encounter time 30 minutes including charting time date of service.   Discussed warning signs or symptoms. Please see discharge instructions. Patient expresses understanding.   The above documentation has been reviewed and is accurate and complete Lynne Leader

## 2019-07-13 ENCOUNTER — Encounter: Payer: Self-pay | Admitting: Family Medicine

## 2019-07-13 ENCOUNTER — Ambulatory Visit (INDEPENDENT_AMBULATORY_CARE_PROVIDER_SITE_OTHER): Payer: No Typology Code available for payment source | Admitting: Family Medicine

## 2019-07-13 ENCOUNTER — Other Ambulatory Visit: Payer: Self-pay

## 2019-07-13 VITALS — BP 126/90 | HR 79 | Temp 97.2°F | Resp 18 | Ht 68.0 in | Wt 192.4 lb

## 2019-07-13 DIAGNOSIS — F411 Generalized anxiety disorder: Secondary | ICD-10-CM

## 2019-07-13 DIAGNOSIS — R350 Frequency of micturition: Secondary | ICD-10-CM

## 2019-07-13 LAB — POC URINALSYSI DIPSTICK (AUTOMATED)
Bilirubin, UA: NEGATIVE
Glucose, UA: NEGATIVE
Ketones, UA: POSITIVE
Leukocytes, UA: NEGATIVE
Nitrite, UA: NEGATIVE
Protein, UA: POSITIVE — AB
Spec Grav, UA: >=1.03 — AB (ref 1.010–1.025)
Urobilinogen, UA: 0.2 E.U./dL
pH, UA: 5.5 (ref 5.0–8.0)

## 2019-07-13 MED ORDER — NITROFURANTOIN MONOHYD MACRO 100 MG PO CAPS: 100.0000 mg | ORAL_CAPSULE | Freq: Two times a day (BID) | ORAL | 0 refills | Status: DC

## 2019-07-13 MED ORDER — DULOXETINE HCL 60 MG PO CPEP: 60.0000 mg | ORAL_CAPSULE | Freq: Every day | ORAL | 1 refills | Status: DC

## 2019-07-13 NOTE — Patient Instructions (Signed)
Urinary Frequency, Adult Urinary frequency means urinating more often than usual. You may urinate every 1-2 hours even though you drink a normal amount of fluid and do not have a bladder infection or condition. Although you urinate more often than normal, the total amount of urine produced in a day is normal. With urinary frequency, you may have an urgent need to urinate often. The stress and anxiety of needing to find a bathroom quickly can make this urge worse. This condition may go away on its own or you may need treatment at home. Home treatment may include bladder training, exercises, taking medicines, or making changes to your diet. Follow these instructions at home: Bladder health   Keep a bladder diary if told by your health care provider. Keep track of: ? What you eat and drink. ? How often you urinate. ? How much you urinate.  Follow a bladder training program if told by your health care provider. This may include: ? Learning to delay going to the bathroom. ? Double urinating (voiding). This helps if you are not completely emptying your bladder. ? Scheduled voiding.  Do Kegel exercises as told by your health care provider. Kegel exercises strengthen the muscles that help control urination, which may help the condition. Eating and drinking  If told by your health care provider, make diet changes, such as: ? Avoiding caffeine. ? Drinking fewer fluids, especially alcohol. ? Not drinking in the evening. ? Avoiding foods or drinks that may irritate the bladder. These include coffee, tea, soda, artificial sweeteners, citrus, tomato-based foods, and chocolate. ? Eating foods that help prevent or ease constipation. Constipation can make this condition worse. Your health care provider may recommend that you:  Drink enough fluid to keep your urine pale yellow.  Take over-the-counter or prescription medicines.  Eat foods that are high in fiber, such as beans, whole grains, and fresh  fruits and vegetables.  Limit foods that are high in fat and processed sugars, such as fried or sweet foods. General instructions  Take over-the-counter and prescription medicines only as told by your health care provider.  Keep all follow-up visits as told by your health care provider. This is important. Contact a health care provider if:  You start urinating more often.  You feel pain or irritation when you urinate.  You notice blood in your urine.  Your urine looks cloudy.  You develop a fever.  You begin vomiting. Get help right away if:  You are unable to urinate. Summary  Urinary frequency means urinating more often than usual. With urinary frequency, you may urinate every 1-2 hours even though you drink a normal amount of fluid and do not have a bladder infection or other bladder condition.  Your health care provider may recommend that you keep a bladder diary, follow a bladder training program, or make dietary changes.  If told by your health care provider, do Kegel exercises to strengthen the muscles that help control urination.  Take over-the-counter and prescription medicines only as told by your health care provider.  Contact a health care provider if your symptoms do not improve or get worse. This information is not intended to replace advice given to you by your health care provider. Make sure you discuss any questions you have with your health care provider. Document Revised: 08/19/2017 Document Reviewed: 08/19/2017 Elsevier Patient Education  2020 [employer]  

## 2019-07-13 NOTE — Progress Notes (Signed)
Patient ID: Tina Frost, female    DOB: [birth year]  Age: 49 y.o. MRN: [id number]    Subjective:  Subjective  HPI Tina Frost presents for urinary frequency and blood in the urine ,    Review of Systems  Constitutional: Negative for activity change, appetite change, chills and fever.  Gastrointestinal: Negative for abdominal distention and abdominal pain.  Genitourinary: Positive for dysuria, frequency and urgency. Negative for difficulty urinating, dyspareunia, flank pain, genital sores, hematuria, menstrual problem, pelvic pain, vaginal discharge and vaginal pain.  Musculoskeletal: Negative for back pain.    History Past Medical History:  Diagnosis Date  . Abdominal pain   . Anxiety   . Atypical nevus 03/17/2017   right calf  . Depression   . History of kidney stones   . Hyperlipidemia     She has a past surgical history that includes Colonoscopy (2002); Appendectomy; Tubal ligation; Kidney stone surgery (2008); Umbilical hernia repair (N/A, 02/11/2015); Insertion of mesh (N/A, 02/11/2015); and Hernia repair (01/2015).   Her family history includes Alzheimer's disease in her maternal grandfather; Breast cancer in her mother; Hyperlipidemia in an other family member; Hypertension in an other family member; Kidney disease in an other family member; Lung cancer in her paternal grandmother; Stroke in her father.She reports that she has never smoked. She has never used smokeless tobacco. She reports that she does not drink alcohol or use drugs.  Current Outpatient Medications on File Prior to Visit  Medication Sig Dispense Refill  . ALPRAZolam (XANAX) 0.25 MG tablet Take 1 tablet (0.25 mg total) by mouth 3 (three) times daily as needed. for anxiety 30 tablet 0  . furosemide (LASIX) 20 MG tablet Take 1 tablet (20 mg total) by mouth daily. 30 tablet 3  . hydrocortisone (ANUSOL-HC) 25 MG suppository Place 1 suppository (25 mg total) rectally 2 (two) times daily. 20 suppository 0     No current facility-administered medications on file prior to visit.     Objective:  Objective  Physical Exam Vitals and nursing note reviewed.  Constitutional:      Appearance: She is well-developed.  HENT:     Head: Normocephalic and atraumatic.  Eyes:     Conjunctiva/sclera: Conjunctivae normal.  Neck:     Thyroid: No thyromegaly.     Vascular: No carotid bruit or JVD.  Cardiovascular:     Rate and Rhythm: Normal rate and regular rhythm.     Heart sounds: Normal heart sounds. No murmur.  Pulmonary:     Effort: Pulmonary effort is normal. No respiratory distress.     Breath sounds: Normal breath sounds. No wheezing or rales.  Chest:     Chest wall: No tenderness.  Musculoskeletal:     Cervical back: Normal range of motion and neck supple.  Neurological:     Mental Status: She is alert and oriented to person, place, and time.    BP 126/90 (BP Location: Right Arm, Patient Position: Sitting, Cuff Size: Normal)   Pulse 79   Temp (!) 97.2 F (36.2 C) (Temporal)   Resp 18   Ht 5\' 8"  (1.727 m)   Wt 192 lb 6.4 oz (87.3 kg)   SpO2 99%   BMI 29.25 kg/m  Wt Readings from Last 3 Encounters:  07/13/19 192 lb 6.4 oz (87.3 kg)  03/16/19 198 lb (89.8 kg)  03/02/19 201 lb 9.6 oz (91.4 kg)     Lab Results  Component Value Date   WBC 6.9 02/13/2019   HGB 15.9 (  H) 02/13/2019   HCT 46.7 (H) 02/13/2019   PLT 259.0 02/13/2019   GLUCOSE 71 02/13/2019   CHOL 187 10/12/2011   TRIG 69.0 10/12/2011   HDL 57.90 10/12/2011   LDLCALC 115 (H) 10/12/2011   ALT 14 02/13/2019   AST 15 02/13/2019   NA 140 02/13/2019   K 3.8 02/13/2019   CL 106 02/13/2019   CREATININE 0.64 02/13/2019   BUN 10 02/13/2019   CO2 26 02/13/2019   TSH 0.91 02/13/2019    VAS [place] ABI WITH/WO TBI  Result Date: 02/16/2019 LOWER EXTREMITY DOPPLER STUDY Indications: Pain when walking. High Risk Factors: Hyperlipidemia.  Performing Technologist: Cardell Peach RDCS, RVT  Examination Guidelines: A complete  evaluation includes at minimum, Doppler waveform signals and systolic blood pressure reading at the level of bilateral brachial, anterior tibial, and posterior tibial arteries, when vessel segments are accessible. Bilateral testing is considered an integral part of a complete examination. Photoelectric Plethysmograph (PPG) waveforms and toe systolic pressure readings are included as required and additional duplex testing as needed. Limited examinations for reoccurring indications may be performed as noted.  ABI Findings: +---------+------------------+-----+---------+--------+ Right    Rt Pressure (mmHg)IndexWaveform Comment  +---------+------------------+-----+---------+--------+ Brachial 148                    triphasic         +---------+------------------+-----+---------+--------+ ATA      165               1.06 triphasic         +---------+------------------+-----+---------+--------+ PTA      195               1.26 triphasic         +---------+------------------+-----+---------+--------+ Great Toe145               0.94 Normal            +---------+------------------+-----+---------+--------+ +---------+------------------+-----+---------+-------+ Left     Lt Pressure (mmHg)IndexWaveform Comment +---------+------------------+-----+---------+-------+ Brachial 155                    triphasic        +---------+------------------+-----+---------+-------+ ATA      169               1.09 triphasic        +---------+------------------+-----+---------+-------+ PTA      194               1.25 triphasic        +---------+------------------+-----+---------+-------+ Great Toe151               0.97 Normal           +---------+------------------+-----+---------+-------+ +-------+-----------+-----------+------------+------------+ ABI/TBIToday's ABIToday's TBIPrevious ABIPrevious TBI +-------+-----------+-----------+------------+------------+ Right  1.26        .94                                 +-------+-----------+-----------+------------+------------+ Left   1.25       .97                                 +-------+-----------+-----------+------------+------------+ No previous exam for comparison  Summary: Right: Resting right ankle-brachial index is within normal range. No evidence of significant right lower extremity arterial disease. The right toe-brachial index is normal. Left: Resting left ankle-brachial index is within normal range. No evidence of significant  left lower extremity arterial disease. The left toe-brachial index is normal.  *See table(s) above for measurements and observations.  Electronically signed by Jenne Campus MD on 02/16/2019 at 12:07:25 PM.   Final      Assessment & Plan:  Plan  I am having Tina Frost start on nitrofurantoin (macrocrystal-monohydrate). I am also having her maintain her hydrocortisone, ALPRAZolam, furosemide, and DULoxetine.  Meds ordered this encounter  Medications  . DULoxetine (CYMBALTA) 60 MG capsule    Sig: Take 1 capsule (60 mg total) by mouth daily.    Dispense:  90 capsule    Refill:  1    This prescription was filled on 09/29/2018. Any refills authorized will be placed on file.  . nitrofurantoin, macrocrystal-monohydrate, (MACROBID) 100 MG capsule    Sig: Take 1 capsule (100 mg total) by mouth 2 (two) times daily.    Dispense:  14 capsule    Refill:  0    Problem List Items Addressed This Visit    None    Visit Diagnoses    Urinary frequency    -  Primary   Relevant Medications   nitrofurantoin, macrocrystal-monohydrate, (MACROBID) 100 MG capsule   Other Relevant Orders   POCT Urinalysis Dipstick (Automated) (Completed)   Urine Culture (Completed)   Generalized anxiety disorder       Relevant Medications   DULoxetine (CYMBALTA) 60 MG capsule      Follow-up: Return if symptoms worsen or fail to improve.  Ann Held, DO

## 2019-07-14 LAB — URINE CULTURE
MICRO NUMBER:: 10501127
Result:: NO GROWTH
SPECIMEN QUALITY:: ADEQUATE

## 2019-07-25 ENCOUNTER — Other Ambulatory Visit: Payer: Self-pay

## 2019-07-25 ENCOUNTER — Encounter: Payer: Self-pay | Admitting: Medical

## 2019-07-25 ENCOUNTER — Ambulatory Visit (INDEPENDENT_AMBULATORY_CARE_PROVIDER_SITE_OTHER): Payer: No Typology Code available for payment source | Admitting: Medical

## 2019-07-25 VITALS — BP 150/85 | HR 77 | Resp 18 | Ht 68.0 in | Wt 198.0 lb

## 2019-07-25 DIAGNOSIS — R42 Dizziness and giddiness: Secondary | ICD-10-CM

## 2019-07-25 DIAGNOSIS — R0600 Dyspnea, unspecified: Secondary | ICD-10-CM | POA: Diagnosis not present

## 2019-07-25 DIAGNOSIS — D649 Anemia, unspecified: Secondary | ICD-10-CM

## 2019-07-25 DIAGNOSIS — G479 Sleep disorder, unspecified: Secondary | ICD-10-CM

## 2019-07-25 DIAGNOSIS — R5383 Other fatigue: Secondary | ICD-10-CM | POA: Diagnosis not present

## 2019-07-25 MED ORDER — ALBUTEROL SULFATE HFA 108 (90 BASE) MCG/ACT IN AERS: 2.0000 | INHALATION_SPRAY | Freq: Four times a day (QID) | RESPIRATORY_TRACT | 0 refills | Status: DC | PRN

## 2019-07-25 MED ORDER — HYDROXYZINE HCL 10 MG PO TABS
ORAL_TABLET | ORAL | 0 refills | Status: DC
Start: 2019-07-25 — End: 2019-09-01

## 2019-07-25 NOTE — Patient Instructions (Addendum)
For fatigue will get cbc, cmp, tsh,iron  b12, b1 and vit D. Future labs placed.  ekg shows nsr.  If light headed worsens let us know. Also check blood pressure daily. Want to see less 140/90.  For episodes dyspnea on exertion get bnp and cxr. Rx trial of albuterol.  For insomnia rx low dose hydroxyzine. Have husband watch to see if you snore.  Follow up date to be determined after lab review and image review.

## 2019-07-25 NOTE — Progress Notes (Addendum)
Subjective:    Patient ID: Tina Frost, female    DOB: 10/24/1970, 49 y.o.   MRN: [id number]  HPI  Pt in with various complaints.   Pt has swelling in legs for one year. Stayed the same. No cxr does. Pt states walking short distances 100 yards will get real winded easily.   At times feels short of breath. No wheezing. No shoulder pain. No jaw pain. No hx of smoking.Sometimes upper chest feels little painful like running in cold.  Pt states trouble staying asleep. No snoring.no sob.   Pt feels light headed and feels out of it at times. Foggy headed.  Pt has been feeling fatigued.    Review of Systems  Constitutional: Positive for fatigue. Negative for chills and fever.  Respiratory: Negative for cough, chest tightness, shortness of breath and wheezing.   Cardiovascular: Negative for chest pain and palpitations.  Gastrointestinal: Negative for abdominal pain.  Musculoskeletal: Negative for back pain.  Skin: Negative for rash.  Neurological: Positive for dizziness.       Slight light headed at times.  Hematological: Negative for adenopathy. Does not bruise/bleed easily.  Psychiatric/Behavioral: Negative for behavioral problems, confusion and suicidal ideas. The patient is not nervous/anxious.     Past Medical History:  Diagnosis Date  . Abdominal pain   . Anxiety   . Atypical nevus 03/17/2017   right calf  . Depression   . History of kidney stones   . Hyperlipidemia      Social History   Socioeconomic History  . Marital status: Married    Spouse name: Not on file  . Number of children: 3  . Years of education: Not on file  . Highest education level: Not on file  Occupational History  . Occupation: Owns Engineer, site  Tobacco Use  . Smoking status: Never Smoker  . Smokeless tobacco: Never Used  Substance and Sexual Activity  . Alcohol use: No  . Drug use: No  . Sexual activity: Yes    Partners: Male  Other Topics Concern  . Not on file  Social History  Narrative   Gets reg exercise   Social Determinants of Health   Financial Resource Strain:   . Difficulty of Paying Living Expenses:   Food Insecurity:   . Worried About Charity fundraiser in the Last Year:   . Arboriculturist in the Last Year:   Transportation Needs:   . Film/video editor (Medical):   Marland Kitchen Lack of Transportation (Non-Medical):   Physical Activity:   . Days of Exercise per Week:   . Minutes of Exercise per Session:   Stress:   . Feeling of Stress :   Social Connections:   . Frequency of Communication with Friends and Family:   . Frequency of Social Gatherings with Friends and Family:   . Attends Religious Services:   . Active Member of Clubs or Organizations:   . Attends Archivist Meetings:   Marland Kitchen Marital Status:   Intimate Partner Violence:   . Fear of Current or Ex-Partner:   . Emotionally Abused:   Marland Kitchen Physically Abused:   . Sexually Abused:     Past Surgical History:  Procedure Laterality Date  . APPENDECTOMY    . COLONOSCOPY  2002  . HERNIA REPAIR  01/2015  . INSERTION OF MESH N/A 02/11/2015   Procedure: INSERTION OF MESH;  Surgeon: Ralene Ok, MD;  Location: [facility];  Service: General;  Laterality: N/A;  .  KIDNEY STONE SURGERY  2008  . TUBAL LIGATION    . UMBILICAL HERNIA REPAIR N/A 02/11/2015   Procedure: LAPAROSCOPIC UMBILICAL HERNIA REPAIR WITH MESH;  Surgeon: Ralene Ok, MD;  Location: [facility];  Service: General;  Laterality: N/A;    Family History  Problem Relation Age of Onset  . Breast cancer Mother   . Lung cancer Paternal Grandmother   . Stroke Father   . Hypertension Other   . Hyperlipidemia Other   . Kidney disease Other        kidney stones  . Alzheimer's disease Maternal Grandfather   . Colon cancer Neg Hx   . Rectal cancer Neg Hx   . Esophageal cancer Neg Hx   . Liver cancer Neg Hx     Allergies  Allergen Reactions  . Amoxicillin Other (See Comments)    Causes kidney stones   . Sulfa Antibiotics      Causes Kidney stones    Current Outpatient Medications on File Prior to Visit  Medication Sig Dispense Refill  . ALPRAZolam (XANAX) 0.25 MG tablet Take 1 tablet (0.25 mg total) by mouth 3 (three) times daily as needed. for anxiety 30 tablet 0  . DULoxetine (CYMBALTA) 60 MG capsule Take 1 capsule (60 mg total) by mouth daily. 90 capsule 1  . furosemide (LASIX) 20 MG tablet Take 1 tablet (20 mg total) by mouth daily. (Patient not taking: Reported on 07/25/2019) 30 tablet 3  . hydrocortisone (ANUSOL-HC) 25 MG suppository Place 1 suppository (25 mg total) rectally 2 (two) times daily. (Patient not taking: Reported on 07/25/2019) 20 suppository 0  . nitrofurantoin, macrocrystal-monohydrate, (MACROBID) 100 MG capsule Take 1 capsule (100 mg total) by mouth 2 (two) times daily. (Patient not taking: Reported on 07/25/2019) 14 capsule 0   No current facility-administered medications on file prior to visit.    BP 140/90 (BP Location: Right Arm, Patient Position: Sitting, Cuff Size: Large)   Pulse 77   Resp 18   Ht 5\' 8"  (1.727 m)   Wt 198 lb (89.8 kg)   SpO2 98%   BMI 30.11 kg/m       Objective:   Physical Exam  General Mental Status- Alert. General Appearance- Not in acute distress.   Skin General: Color- Normal Color. Moisture- Normal Moisture.  Neck Carotid Arteries- Normal color. Moisture- Normal Moisture. No carotid bruits. No JVD.  Chest and Lung Exam Auscultation: Breath Sounds:-Normal.  Cardiovascular Auscultation:Rythm- Regular. Murmurs & Other Heart Sounds:Auscultation of the heart reveals- No Murmurs.  Abdomen Inspection:-Inspeection Normal. Palpation/Percussion:Note:No mass. Palpation and Percussion of the abdomen reveal- Non Tender, Non Distended + BS, no rebound or guarding.    Neurologic Cranial Nerve exam:- CN III-XII intact(No nystagmus), symmetric smile. Gross motor function intact bilaterally..  Lower ext- symmetric faint swelling. Negative homans signs.        Assessment & Plan:  For fatigue will get cbc, cmp, tsh,iron  b12, b1 and vit D.  ekg showed normal sinus rhythm.  If light headed worsens let us know. Also check blood pressure daily. Want to see less 140/90.  For episodes dyspnea on exertion get bnp and cxr. Rx trial of albuterol.  For insomnia rx low dose hydroxyzine. Have husband watch to see if you snore.  Follow up date to be determined after lab review and image review.   Mackie Pai, PA-C   Time spent with patient today was 30  minutes which consisted of chart review, discussing diagnoses, work up treatment and documentation.

## 2019-07-26 ENCOUNTER — Ambulatory Visit (HOSPITAL_BASED_OUTPATIENT_CLINIC_OR_DEPARTMENT_OTHER)
Admission: RE | Admit: 2019-07-26 | Discharge: 2019-07-26 | Disposition: A | Discharge status: Home / Self Care | Payer: No Typology Code available for payment source | Source: Ambulatory Visit | Attending: Medical | Admitting: Medical

## 2019-07-26 ENCOUNTER — Other Ambulatory Visit (INDEPENDENT_AMBULATORY_CARE_PROVIDER_SITE_OTHER): Payer: No Typology Code available for payment source

## 2019-07-26 DIAGNOSIS — R5383 Other fatigue: Secondary | ICD-10-CM

## 2019-07-26 DIAGNOSIS — D649 Anemia, unspecified: Secondary | ICD-10-CM | POA: Diagnosis not present

## 2019-07-26 DIAGNOSIS — R0600 Dyspnea, unspecified: Secondary | ICD-10-CM

## 2019-07-26 LAB — CBC WITH DIFFERENTIAL/PLATELET
Basophils Absolute: 0.1 10*3/uL (ref 0.0–0.1)
Basophils Relative: 1.2 % (ref 0.0–3.0)
Eosinophils Absolute: 0.2 10*3/uL (ref 0.0–0.7)
Eosinophils Relative: 2.7 % (ref 0.0–5.0)
HCT: 44.1 % (ref 36.0–46.0)
Hemoglobin: 15.2 g/dL — ABNORMAL HIGH (ref 12.0–15.0)
Lymphocytes Relative: 24.2 % (ref 12.0–46.0)
Lymphs Abs: 1.4 10*3/uL (ref 0.7–4.0)
MCHC: 34.4 g/dL (ref 30.0–36.0)
MCV: 93.6 fl (ref 78.0–100.0)
Monocytes Absolute: 0.4 10*3/uL (ref 0.1–1.0)
Monocytes Relative: 6.5 % (ref 3.0–12.0)
Neutro Abs: 3.7 10*3/uL (ref 1.4–7.7)
Neutrophils Relative %: 65.4 % (ref 43.0–77.0)
Platelets: 218 10*3/uL (ref 150.0–400.0)
RBC: 4.71 Mil/uL (ref 3.87–5.11)
RDW: 13.1 % (ref 11.5–15.5)
WBC: 5.7 10*3/uL (ref 4.0–10.5)

## 2019-07-26 LAB — COMPREHENSIVE METABOLIC PANEL
ALT: 17 U/L (ref 0–35)
AST: 13 U/L (ref 0–37)
Albumin: 4.1 g/dL (ref 3.5–5.2)
Alkaline Phosphatase: 52 U/L (ref 39–117)
BUN: 9 mg/dL (ref 6–23)
CO2: 26 mEq/L (ref 19–32)
Calcium: 8.9 mg/dL (ref 8.4–10.5)
Chloride: 107 mEq/L (ref 96–112)
Creatinine, Ser: 0.61 mg/dL (ref 0.40–1.20)
GFR: 105.03 mL/min (ref >60.00)
Glucose, Bld: 91 mg/dL (ref 70–99)
Potassium: 4.2 mEq/L (ref 3.5–5.1)
Sodium: 137 mEq/L (ref 135–145)
Total Bilirubin: 1 mg/dL (ref 0.2–1.2)
Total Protein: 6.3 g/dL (ref 6.0–8.3)

## 2019-07-26 LAB — TSH: TSH: 0.85 u[IU]/mL (ref 0.35–4.50)

## 2019-07-26 LAB — IRON: Iron: 92 ug/dL (ref 42–145)

## 2019-07-26 LAB — BRAIN NATRIURETIC PEPTIDE: Pro B Natriuretic peptide (BNP): 94 pg/mL (ref 0.0–100.0)

## 2019-07-26 LAB — VITAMIN B12: Vitamin B-12: >1526 pg/mL — ABNORMAL HIGH (ref 211–911)

## 2019-07-28 ENCOUNTER — Ambulatory Visit: Payer: No Typology Code available for payment source | Admitting: Family Medicine

## 2019-07-29 LAB — VITAMIN D 1,25 DIHYDROXY
Vitamin D 1, 25 (OH)2 Total: 77 pg/mL — ABNORMAL HIGH (ref 18–72)
Vitamin D2 1, 25 (OH)2: <8 pg/mL
Vitamin D3 1, 25 (OH)2: 77 pg/mL

## 2019-07-31 LAB — VITAMIN B1: Vitamin B1 (Thiamine): 13 nmol/L (ref 8–30)

## 2019-08-01 ENCOUNTER — Encounter: Payer: Self-pay | Admitting: Family Medicine

## 2019-08-01 MED ORDER — DULOXETINE HCL 30 MG PO CPEP
30.0000 mg | ORAL_CAPSULE | Freq: Every day | ORAL | 0 refills | Status: DC
Start: 2019-08-01 — End: 2019-09-05

## 2019-08-01 NOTE — Telephone Encounter (Signed)
Decrease to cymbalta to 30 mg daily for 1 months --- give 1 refill incase she needs it Sometimes it is hard to stop the cymbalta-- if she has trouble stopping at this point let us know

## 2019-08-08 ENCOUNTER — Other Ambulatory Visit: Payer: Self-pay

## 2019-08-08 ENCOUNTER — Telehealth: Payer: Self-pay | Admitting: Family Medicine

## 2019-08-08 ENCOUNTER — Emergency Department (HOSPITAL_BASED_OUTPATIENT_CLINIC_OR_DEPARTMENT_OTHER): Payer: No Typology Code available for payment source

## 2019-08-08 ENCOUNTER — Telehealth: Payer: Self-pay

## 2019-08-08 ENCOUNTER — Encounter: Payer: Self-pay | Admitting: Medical

## 2019-08-08 ENCOUNTER — Emergency Department (HOSPITAL_BASED_OUTPATIENT_CLINIC_OR_DEPARTMENT_OTHER)
Admission: EM | Admit: 2019-08-08 | Discharge: 2019-08-08 | Disposition: A | Discharge status: Home / Self Care | Payer: No Typology Code available for payment source | Attending: Emergency Medicine | Admitting: Emergency Medicine

## 2019-08-08 ENCOUNTER — Encounter (HOSPITAL_BASED_OUTPATIENT_CLINIC_OR_DEPARTMENT_OTHER): Payer: Self-pay

## 2019-08-08 DIAGNOSIS — Z880 Allergy status to penicillin: Secondary | ICD-10-CM | POA: Diagnosis not present

## 2019-08-08 DIAGNOSIS — E876 Hypokalemia: Secondary | ICD-10-CM

## 2019-08-08 DIAGNOSIS — R030 Elevated blood-pressure reading, without diagnosis of hypertension: Secondary | ICD-10-CM

## 2019-08-08 DIAGNOSIS — R2243 Localized swelling, mass and lump, lower limb, bilateral: Secondary | ICD-10-CM | POA: Diagnosis not present

## 2019-08-08 DIAGNOSIS — R519 Headache, unspecified: Secondary | ICD-10-CM | POA: Insufficient documentation

## 2019-08-08 DIAGNOSIS — R0600 Dyspnea, unspecified: Secondary | ICD-10-CM

## 2019-08-08 DIAGNOSIS — R0602 Shortness of breath: Secondary | ICD-10-CM | POA: Diagnosis present

## 2019-08-08 LAB — COMPREHENSIVE METABOLIC PANEL
ALT: 15 U/L (ref 0–44)
AST: 17 U/L (ref 15–41)
Albumin: 4.4 g/dL (ref 3.5–5.0)
Alkaline Phosphatase: 61 U/L (ref 38–126)
Anion gap: 10 (ref 5–15)
BUN: 14 mg/dL (ref 6–20)
CO2: 24 mmol/L (ref 22–32)
Calcium: 9.3 mg/dL (ref 8.9–10.3)
Chloride: 106 mmol/L (ref 98–111)
Creatinine, Ser: 0.73 mg/dL (ref 0.44–1.00)
GFR calc Af Amer: >60 mL/min (ref >60)
GFR calc non Af Amer: >60 mL/min (ref >60)
Glucose, Bld: 117 mg/dL — ABNORMAL HIGH (ref 70–99)
Potassium: 3.4 mmol/L — ABNORMAL LOW (ref 3.5–5.1)
Sodium: 140 mmol/L (ref 135–145)
Total Bilirubin: 1.5 mg/dL — ABNORMAL HIGH (ref 0.3–1.2)
Total Protein: 7.4 g/dL (ref 6.5–8.1)

## 2019-08-08 LAB — CBC
HCT: 47 % — ABNORMAL HIGH (ref 36.0–46.0)
Hemoglobin: 16.4 g/dL — ABNORMAL HIGH (ref 12.0–15.0)
MCH: 31.6 pg (ref 26.0–34.0)
MCHC: 34.9 g/dL (ref 30.0–36.0)
MCV: 90.6 fL (ref 80.0–100.0)
Platelets: 270 10*3/uL (ref 150–400)
RBC: 5.19 MIL/uL — ABNORMAL HIGH (ref 3.87–5.11)
RDW: 12.6 % (ref 11.5–15.5)
WBC: 5.9 10*3/uL (ref 4.0–10.5)
nRBC: 0 % (ref 0.0–0.2)

## 2019-08-08 LAB — TSH: TSH: 0.511 u[IU]/mL (ref 0.350–4.500)

## 2019-08-08 LAB — BRAIN NATRIURETIC PEPTIDE: B Natriuretic Peptide: 52 pg/mL (ref 0.0–100.0)

## 2019-08-08 MED ORDER — LISINOPRIL-HYDROCHLOROTHIAZIDE 10-12.5 MG PO TABS
1.0000 | ORAL_TABLET | Freq: Every day | ORAL | 0 refills | Status: DC
Start: 2019-08-08 — End: 2019-09-05

## 2019-08-08 MED ORDER — POTASSIUM CHLORIDE CRYS ER 20 MEQ PO TBCR
40.0000 meq | EXTENDED_RELEASE_TABLET | Freq: Once | ORAL | Status: AC
  Administered 2019-08-08: 40 meq via ORAL
  Filled 2019-08-08: qty 2

## 2019-08-08 NOTE — Discharge Instructions (Addendum)
It was our pleasure to provide your ER care today - we hope that you feel better.  Take acetaminophen or ibuprofen as need.   You initial blood pressure was high, but now it is better  - see attached information regarding hypertension and preventing hypertension.   From today's lab tests, your potassium is slightly low (3.4) - eat plenty of fruits and vegetables, see attached information.  For recent symptoms, follow up with primary care doctor in the next couple weeks.   Return to ER if worse, new symptoms, fevers, chest pain, increased trouble breathing, new or severe pain, weak/fainting, or other concern.

## 2019-08-08 NOTE — Telephone Encounter (Signed)
She needs ov --- i'm not in until Thursday and she needs to be seen

## 2019-08-08 NOTE — ED Provider Notes (Addendum)
[facility] EMERGENCY DEPARTMENT Provider Note   CSN: [id number] Arrival date & time: 08/08/19  1334     History Chief Complaint  Patient presents with  . Leg Swelling  . Shortness of Breath    Tina Frost is a 49 y.o. female.  Patient c/o generally not feeling well for the past few weeks. Symptoms gradual onset, constant, moderate, without acute or abrupt change today. States concerned that bp has been high. Also intermittent frontal headaches, similar to prior, but perhaps more frequent recently - gradual onset, frontal, dull to throbbing, mild-mod. No acute, abrupt or severe head pain. Also notes mild constant, sob. No chest pain or discomfort. No cough or uri symptoms. No fever or chills. Denies new or increased leg pain or swelling. No orthopnea or pnd.   The history is provided by the patient.  Shortness of Breath Associated symptoms: headaches   Associated symptoms: no abdominal pain, no chest pain, no cough, no fever, no neck pain, no rash and no vomiting        Past Medical History:  Diagnosis Date  . Abdominal pain   . Anxiety   . Atypical nevus 03/17/2017   right calf  . Depression   . History of kidney stones   . Hyperlipidemia     Patient Active Problem List   Diagnosis Date Noted  . 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 Peroneal tendinitis, right 03/16/2019  . 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 Overweight (BMI 25.0-29.9) 04/02/2017  . Other fatigue 04/02/2017  . History of iron deficiency 04/02/2017  . Lower extremity edema 04/02/2017  . Rectal bleeding 09/30/2016  . 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 External hemorrhoid 09/30/2016  . Obesity (BMI 30-39.9) 04/24/2013  . NEPHROLITHIASIS, HX OF 08/29/2009  . DEPRESSION 04/27/2007  . WEIGHT GAIN 04/27/2007    Past Surgical History:  Procedure Laterality Date  . APPENDECTOMY    . COLONOSCOPY  2002  . HERNIA REPAIR  01/2015  . INSERTION OF MESH N/A 02/11/2015    Procedure: INSERTION OF MESH;  Surgeon: Ralene Ok, MD;  Location: [facility];  Service: General;  Laterality: N/A;  . KIDNEY STONE SURGERY  2008  . TUBAL LIGATION    . UMBILICAL HERNIA REPAIR N/A 02/11/2015   Procedure: LAPAROSCOPIC UMBILICAL HERNIA REPAIR WITH MESH;  Surgeon: Ralene Ok, MD;  Location: [facility];  Service: General;  Laterality: N/A;     OB History   No obstetric history on file.     Family History  Problem Relation Age of Onset  . Breast cancer Mother   . Lung cancer Paternal Grandmother   . Stroke Father   . Hypertension Other   . Hyperlipidemia Other   . Kidney disease Other        kidney stones  . Alzheimer's disease Maternal Grandfather   . Colon cancer Neg Hx   . Rectal cancer Neg Hx   . Esophageal cancer Neg Hx   . Liver cancer Neg Hx     Social History   Tobacco Use  . Smoking status: Never Smoker  . Smokeless tobacco: Never Used  Substance Use Topics  . Alcohol use: No  . Drug use: No    Home Medications Prior to Admission medications   Medication Sig Start Date End Date Taking? Authorizing Provider  albuterol (VENTOLIN HFA) 108 (90 Base) MCG/ACT inhaler Inhale 2 puffs into the lungs every 6 (six) hours as needed for shortness of breath. 07/25/19   Saguier, Percell Miller,  PA-C  ALPRAZolam (XANAX) 0.25 MG tablet Take 1 tablet (0.25 mg total) by mouth 3 (three) times daily as needed. for anxiety 02/13/19   Carollee Herter, Kendrick Fries R, DO  DULoxetine (CYMBALTA) 30 MG capsule Take 1 capsule (30 mg total) by mouth daily. 08/01/19   Ann Held, DO  furosemide (LASIX) 20 MG tablet Take 1 tablet (20 mg total) by mouth daily. Patient not taking: Reported on 07/25/2019 02/13/19   Carollee Herter, Alferd Apa, DO  hydrocortisone (ANUSOL-HC) 25 MG suppository Place 1 suppository (25 mg total) rectally 2 (two) times daily. Patient not taking: Reported on 07/25/2019 09/20/18   Saguier, Percell Miller, PA-C  hydrOXYzine (ATARAX/VISTARIL) 10 MG tablet 1-2 tab po prn insomnia 07/25/19    Saguier, Percell Miller, PA-C  nitrofurantoin, macrocrystal-monohydrate, (MACROBID) 100 MG capsule Take 1 capsule (100 mg total) by mouth 2 (two) times daily. Patient not taking: Reported on 07/25/2019 07/13/19   Ann Held, DO    Allergies    Amoxicillin and Sulfa antibiotics  Review of Systems   Review of Systems  Constitutional: Negative for chills and fever.  HENT: Negative for rhinorrhea and sinus pain.   Eyes: Negative for pain, redness and visual disturbance.  Respiratory: Positive for shortness of breath. Negative for cough.   Cardiovascular: Negative for chest pain and leg swelling.  Gastrointestinal: Negative for abdominal pain and vomiting.  Genitourinary: Negative for flank pain.  Musculoskeletal: Negative for back pain and neck pain.  Skin: Negative for rash.  Neurological: Positive for headaches. Negative for speech difficulty, weakness and numbness.  Hematological: Does not bruise/bleed easily.  Psychiatric/Behavioral: Negative for confusion.    Physical Exam Updated Vital Signs BP (!) 158/111 (BP Location: Right Arm)   Pulse 83   Temp 98.8 F (37.1 C) (Oral)   Resp 19   Ht 1.727 m (5\' 8" )   Wt 87 kg   LMP 08/07/2019   SpO2 100%   BMI 29.18 kg/m   Physical Exam Vitals and nursing note reviewed.  Constitutional:      Appearance: Normal appearance. She is well-developed.  HENT:     Head: Atraumatic.     Comments: No sinus or temporal tenderness.     Nose: Nose normal.     Mouth/Throat:     Mouth: Mucous membranes are moist.  Eyes:     General: No scleral icterus.    Conjunctiva/sclera: Conjunctivae normal.     Pupils: Pupils are equal, round, and reactive to light.  Neck:     Vascular: No carotid bruit.     Trachea: No tracheal deviation.     Comments: Thyroid not grossly enlarged or tender.  Cardiovascular:     Rate and Rhythm: Normal rate and regular rhythm.     Pulses: Normal pulses.     Heart sounds: Normal heart sounds. No murmur heard.    No friction rub. No gallop.   Pulmonary:     Effort: Pulmonary effort is normal. No respiratory distress.     Breath sounds: Normal breath sounds.  Abdominal:     General: Bowel sounds are normal. There is no distension.     Palpations: Abdomen is soft.     Tenderness: There is no abdominal tenderness. There is no guarding.  Genitourinary:    Comments: No cva tenderness.  Musculoskeletal:        General: No swelling or tenderness.     Cervical back: Normal range of motion and neck supple. No rigidity. No muscular tenderness.  Skin:  General: Skin is warm and dry.     Findings: No rash.  Neurological:     Mental Status: She is alert.     Comments: Alert, speech normal.  No aphasia or dysarthria. Motor/sens grossly intact bil. Steady gait.   Psychiatric:        Mood and Affect: Mood normal.     ED Results / Procedures / Treatments   Labs (all labs ordered are listed, but only abnormal results are displayed) Results for orders placed or performed during the hospital encounter of 08/08/19  Comprehensive metabolic panel  Result Value Ref Range   Sodium 140 135 - 145 mmol/L   Potassium 3.4 (L) 3.5 - 5.1 mmol/L   Chloride 106 98 - 111 mmol/L   CO2 24 22 - 32 mmol/L   Glucose, Bld 117 (H) 70 - 99 mg/dL   BUN 14 6 - 20 mg/dL   Creatinine, Ser 0.73 0.44 - 1.00 mg/dL   Calcium 9.3 8.9 - 10.3 mg/dL   Total Protein 7.4 6.5 - 8.1 g/dL   Albumin 4.4 3.5 - 5.0 g/dL   AST 17 15 - 41 U/L   ALT 15 0 - 44 U/L   Alkaline Phosphatase 61 38 - 126 U/L   Total Bilirubin 1.5 (H) 0.3 - 1.2 mg/dL   GFR calc non Af Amer >60 >60 mL/min   GFR calc Af Amer >60 >60 mL/min   Anion gap 10 5 - 15  CBC  Result Value Ref Range   WBC 5.9 4.0 - 10.5 K/uL   RBC 5.19 (H) 3.87 - 5.11 MIL/uL   Hemoglobin 16.4 (H) 12.0 - 15.0 g/dL   HCT 47.0 (H) 36 - 46 %   MCV 90.6 80.0 - 100.0 fL   MCH 31.6 26.0 - 34.0 pg   MCHC 34.9 30.0 - 36.0 g/dL   RDW 12.6 11.5 - 15.5 %   Platelets 270 150 - 400 K/uL   nRBC  0.0 0.0 - 0.2 %  Brain natriuretic peptide  Result Value Ref Range   B Natriuretic Peptide 52.0 0.0 - 100.0 pg/mL   DG Chest 2 View  Result Date: 08/08/2019 CLINICAL DATA:  Shortness of breath and fatigue for a few weeks, worse today. Headache today. EXAM: CHEST - 2 VIEW COMPARISON:  PA and lateral chest 07/26/2019 and 03/22/2004. FINDINGS: Lungs are clear. Heart size is normal. No pneumothorax or pleural fluid. No acute or focal bony abnormality. IMPRESSION: Negative chest. Electronically Signed   By: Inge Rise M.D.   On: 08/08/2019 15:24   DG Chest 2 View  Result Date: 07/26/2019 CLINICAL DATA:  Dyspnea on exertion.  Short of breath. EXAM: CHEST - 2 VIEW COMPARISON:  03/22/2004 FINDINGS: The heart size and mediastinal contours are within normal limits. Both lungs are clear. The visualized skeletal structures are unremarkable. IMPRESSION: No active cardiopulmonary disease. Electronically Signed   By: Franchot Gallo M.D.   On: 07/26/2019 10:26    EKG EKG Interpretation  Date/Time:  Tuesday August 08 2019 13:37:14 EDT Ventricular Rate:  83 PR Interval:  134 QRS Duration: 88 QT Interval:  358 QTC Calculation: 420 R Axis:   53 Text Interpretation: Normal sinus rhythm Nonspecific T wave abnormality No previous tracing Confirmed by Lajean Saver [phone]) on 08/08/2019 1:59:01 PM   Radiology DG Chest 2 View  Result Date: 08/08/2019 CLINICAL DATA:  Shortness of breath and fatigue for a few weeks, worse today. Headache today. EXAM: CHEST - 2 VIEW COMPARISON:  PA and lateral  chest 07/26/2019 and 03/22/2004. FINDINGS: Lungs are clear. Heart size is normal. No pneumothorax or pleural fluid. No acute or focal bony abnormality. IMPRESSION: Negative chest. Electronically Signed   By: Inge Rise M.D.   On: 08/08/2019 15:24    Procedures Procedures (including critical care time)  Medications Ordered in ED Medications - No data to display  ED Course  I have reviewed the triage vital  signs and the nursing notes.  Pertinent labs & imaging results that were available during my care of the patient were reviewed by me and considered in my medical decision making (see chart for details).    MDM Rules/Calculators/A&P                          Labs sent.   Reviewed nursing notes and prior charts for additional history.   Initial labs reviewed/interpreted by me - bnp normal. k sl low, kcl po. Wbc normal.   CXR reviewed/intepreted by me - no pna.   Recheck bp - much improved from initial normal.   Patient vital signs normal, currently, hr 80, rr 14, room air pulse ox 100%.   Pt currently appears stable for d/c.   Rec pcp f/u.  Return precaution provided.      Final Clinical Impression(s) / ED Diagnoses Final diagnoses:  None    Rx / DC Orders ED Discharge Orders    None          Lajean Saver, MD 08/08/19 1527

## 2019-08-08 NOTE — ED Triage Notes (Signed)
Pt arrives with increased SOB and headache today. SOB for a few weeks with fatigue. Also states concern for ankle swelling X1 year.

## 2019-08-08 NOTE — Telephone Encounter (Signed)
FYI

## 2019-08-08 NOTE — Telephone Encounter (Signed)
Patient with BP of  144/104... Per Triage she needed to go to ED  Refused so Triage nurse states that we were suppose to call us back and let us know so we can call her back.  Patient told Triage nurse she was going back to work

## 2019-08-09 NOTE — Telephone Encounter (Signed)
Pt has OV for tomorrow

## 2019-08-10 ENCOUNTER — Encounter: Payer: Self-pay | Admitting: Family Medicine

## 2019-08-10 ENCOUNTER — Ambulatory Visit (INDEPENDENT_AMBULATORY_CARE_PROVIDER_SITE_OTHER): Payer: No Typology Code available for payment source | Admitting: Family Medicine

## 2019-08-10 ENCOUNTER — Other Ambulatory Visit: Payer: Self-pay

## 2019-08-10 VITALS — BP 114/80 | HR 85 | Temp 97.1°F | Resp 18 | Ht 68.0 in | Wt 192.2 lb

## 2019-08-10 DIAGNOSIS — R5383 Other fatigue: Secondary | ICD-10-CM | POA: Diagnosis not present

## 2019-08-10 DIAGNOSIS — R42 Dizziness and giddiness: Secondary | ICD-10-CM

## 2019-08-10 DIAGNOSIS — D582 Other hemoglobinopathies: Secondary | ICD-10-CM

## 2019-08-10 DIAGNOSIS — R0602 Shortness of breath: Secondary | ICD-10-CM

## 2019-08-10 DIAGNOSIS — I10 Essential (primary) hypertension: Secondary | ICD-10-CM

## 2019-08-10 HISTORY — DX: Shortness of breath: R06.02

## 2019-08-10 HISTORY — DX: Dizziness and giddiness: R42

## 2019-08-10 HISTORY — DX: Essential (primary) hypertension: I10

## 2019-08-10 HISTORY — DX: Other hemoglobinopathies: D58.2

## 2019-08-10 LAB — CBC WITH DIFFERENTIAL/PLATELET
Basophils Absolute: 0.1 10*3/uL (ref 0.0–0.1)
Basophils Relative: 1.8 % (ref 0.0–3.0)
Eosinophils Absolute: 0.1 10*3/uL (ref 0.0–0.7)
Eosinophils Relative: 1.8 % (ref 0.0–5.0)
HCT: 45.8 % (ref 36.0–46.0)
Hemoglobin: 15.9 g/dL — ABNORMAL HIGH (ref 12.0–15.0)
Lymphocytes Relative: 36.7 % (ref 12.0–46.0)
Lymphs Abs: 2 10*3/uL (ref 0.7–4.0)
MCHC: 34.8 g/dL (ref 30.0–36.0)
MCV: 93.3 fl (ref 78.0–100.0)
Monocytes Absolute: 0.3 10*3/uL (ref 0.1–1.0)
Monocytes Relative: 6.1 % (ref 3.0–12.0)
Neutro Abs: 2.9 10*3/uL (ref 1.4–7.7)
Neutrophils Relative %: 53.6 % (ref 43.0–77.0)
Platelets: 243 10*3/uL (ref 150.0–400.0)
RBC: 4.91 Mil/uL (ref 3.87–5.11)
RDW: 13.3 % (ref 11.5–15.5)
WBC: 5.4 10*3/uL (ref 4.0–10.5)

## 2019-08-10 LAB — COMPREHENSIVE METABOLIC PANEL
ALT: 11 U/L (ref 0–35)
AST: 12 U/L (ref 0–37)
Albumin: 4.5 g/dL (ref 3.5–5.2)
Alkaline Phosphatase: 63 U/L (ref 39–117)
BUN: 15 mg/dL (ref 6–23)
CO2: 29 mEq/L (ref 19–32)
Calcium: 10.1 mg/dL (ref 8.4–10.5)
Chloride: 104 mEq/L (ref 96–112)
Creatinine, Ser: 0.63 mg/dL (ref 0.40–1.20)
GFR: 101.18 mL/min (ref >60.00)
Glucose, Bld: 87 mg/dL (ref 70–99)
Potassium: 4.4 mEq/L (ref 3.5–5.1)
Sodium: 138 mEq/L (ref 135–145)
Total Bilirubin: 1.3 mg/dL — ABNORMAL HIGH (ref 0.2–1.2)
Total Protein: 6.9 g/dL (ref 6.0–8.3)

## 2019-08-10 NOTE — Assessment & Plan Note (Signed)
With htn Check echo

## 2019-08-10 NOTE — Assessment & Plan Note (Signed)
Refer to hematology   ? PV

## 2019-08-10 NOTE — Assessment & Plan Note (Signed)
Er labs and eval reviewed

## 2019-08-10 NOTE — Patient Instructions (Signed)
DASH Eating Plan DASH stands for "Dietary Approaches to Stop Hypertension." The DASH eating plan is a healthy eating plan that has been shown to reduce high blood pressure (hypertension). It may also reduce your risk for type 2 diabetes, heart disease, and stroke. The DASH eating plan may also help with weight loss. What are tips for following this plan?  General guidelines  Avoid eating more than 2,300 mg (milligrams) of salt (sodium) a day. If you have hypertension, you may need to reduce your sodium intake to 1,500 mg a day.  Limit alcohol intake to no more than 1 drink a day for nonpregnant women and 2 drinks a day for men. One drink equals 12 oz of beer, 5 oz of wine, or 1 oz of hard liquor.  Work with your health care provider to maintain a healthy body weight or to lose weight. Ask what an ideal weight is for you.  Get at least 30 minutes of exercise that causes your heart to beat faster (aerobic exercise) most days of the week. Activities may include walking, swimming, or biking.  Work with your health care provider or diet and nutrition specialist (dietitian) to adjust your eating plan to your individual calorie needs. Reading food labels   Check food labels for the amount of sodium per serving. Choose foods with less than 5 percent of the Daily Value of sodium. Generally, foods with less than 300 mg of sodium per serving fit into this eating plan.  To find whole grains, look for the word "whole" as the first word in the ingredient list. Shopping  Buy products labeled as "low-sodium" or "no salt added."  Buy fresh foods. Avoid canned foods and premade or frozen meals. Cooking  Avoid adding salt when cooking. Use salt-free seasonings or herbs instead of table salt or sea salt. Check with your health care provider or pharmacist before using salt substitutes.  Do not fry foods. Cook foods using healthy methods such as baking, boiling, grilling, and broiling instead.  Cook with  heart-healthy oils, such as olive, canola, soybean, or sunflower oil. Meal planning  Eat a balanced diet that includes: ? 5 or more servings of fruits and vegetables each day. At each meal, try to fill half of your plate with fruits and vegetables. ? Up to 6-8 servings of whole grains each day. ? Less than 6 oz of lean meat, poultry, or fish each day. A 3-oz serving of meat is about the same size as a deck of cards. One egg equals 1 oz. ? 2 servings of low-fat dairy each day. ? A serving of nuts, seeds, or beans 5 times each week. ? Heart-healthy fats. Healthy fats called Omega-3 fatty acids are found in foods such as flaxseeds and coldwater fish, like sardines, salmon, and mackerel.  Limit how much you eat of the following: ? Canned or prepackaged foods. ? Food that is high in trans fat, such as fried foods. ? Food that is high in saturated fat, such as fatty meat. ? Sweets, desserts, sugary drinks, and other foods with added sugar. ? Full-fat dairy products.  Do not salt foods before eating.  Try to eat at least 2 vegetarian meals each week.  Eat more home-cooked food and less restaurant, buffet, and fast food.  When eating at a restaurant, ask that your food be prepared with less salt or no salt, if possible. What foods are recommended? The items listed may not be a complete list. Talk with your dietitian about   what dietary choices are best for you. Grains Whole-grain or whole-wheat bread. Whole-grain or whole-wheat pasta. Brown rice. Oatmeal. Quinoa. Bulgur. Whole-grain and low-sodium cereals. Pita bread. Low-fat, low-sodium crackers. Whole-wheat flour tortillas. Vegetables Fresh or frozen vegetables (raw, steamed, roasted, or grilled). Low-sodium or reduced-sodium tomato and vegetable juice. Low-sodium or reduced-sodium tomato sauce and tomato paste. Low-sodium or reduced-sodium canned vegetables. Fruits All fresh, dried, or frozen fruit. Canned fruit in natural juice (without  added sugar). Meat and other protein foods Skinless chicken or turkey. Ground chicken or turkey. Pork with fat trimmed off. Fish and seafood. Egg whites. Dried beans, peas, or lentils. Unsalted nuts, nut butters, and seeds. Unsalted canned beans. Lean cuts of beef with fat trimmed off. Low-sodium, lean deli meat. Dairy Low-fat (1%) or fat-free (skim) milk. Fat-free, low-fat, or reduced-fat cheeses. Nonfat, low-sodium ricotta or cottage cheese. Low-fat or nonfat yogurt. Low-fat, low-sodium cheese. Fats and oils Soft margarine without trans fats. Vegetable oil. Low-fat, reduced-fat, or light mayonnaise and salad dressings (reduced-sodium). Canola, safflower, olive, soybean, and sunflower oils. Avocado. Seasoning and other foods Herbs. Spices. Seasoning mixes without salt. Unsalted popcorn and pretzels. Fat-free sweets. What foods are not recommended? The items listed may not be a complete list. Talk with your dietitian about what dietary choices are best for you. Grains Baked goods made with fat, such as croissants, muffins, or some breads. Dry pasta or rice meal packs. Vegetables Creamed or fried vegetables. Vegetables in a cheese sauce. Regular canned vegetables (not low-sodium or reduced-sodium). Regular canned tomato sauce and paste (not low-sodium or reduced-sodium). Regular tomato and vegetable juice (not low-sodium or reduced-sodium). Pickles. Olives. Fruits Canned fruit in a light or heavy syrup. Fried fruit. Fruit in cream or butter sauce. Meat and other protein foods Fatty cuts of meat. Ribs. Fried meat. Bacon. Sausage. Bologna and other processed lunch meats. Salami. Fatback. Hotdogs. Bratwurst. Salted nuts and seeds. Canned beans with added salt. Canned or smoked fish. Whole eggs or egg yolks. Chicken or turkey with skin. Dairy Whole or 2% milk, cream, and half-and-half. Whole or full-fat cream cheese. Whole-fat or sweetened yogurt. Full-fat cheese. Nondairy creamers. Whipped toppings.  Processed cheese and cheese spreads. Fats and oils Butter. Stick margarine. Lard. Shortening. Ghee. Bacon fat. Tropical oils, such as coconut, palm kernel, or palm oil. Seasoning and other foods Salted popcorn and pretzels. Onion salt, garlic salt, seasoned salt, table salt, and sea salt. Worcestershire sauce. Tartar sauce. Barbecue sauce. Teriyaki sauce. Soy sauce, including reduced-sodium. Steak sauce. Canned and packaged gravies. Fish sauce. Oyster sauce. Cocktail sauce. Horseradish that you find on the shelf. Ketchup. Mustard. Meat flavorings and tenderizers. Bouillon cubes. Hot sauce and Tabasco sauce. Premade or packaged marinades. Premade or packaged taco seasonings. Relishes. Regular salad dressings. Where to find more information:  National Heart, Lung, and Blood Institute: www.nhlbi.nih.gov  American Heart Association: www.heart.org Summary  The DASH eating plan is a healthy eating plan that has been shown to reduce high blood pressure (hypertension). It may also reduce your risk for type 2 diabetes, heart disease, and stroke.  With the DASH eating plan, you should limit salt (sodium) intake to 2,300 mg a day. If you have hypertension, you may need to reduce your sodium intake to 1,500 mg a day.  When on the DASH eating plan, aim to eat more fresh fruits and vegetables, whole grains, lean proteins, low-fat dairy, and heart-healthy fats.  Work with your health care provider or diet and nutrition specialist (dietitian) to adjust your eating plan to your   individual calorie needs. This information is not intended to replace advice given to you by your health care provider. Make sure you discuss any questions you have with your health care provider. Document Revised: 01/22/2017 Document Reviewed: 02/03/2016 Elsevier Patient Education  2020 [employer]  

## 2019-08-10 NOTE — Assessment & Plan Note (Signed)
Well controlled, no changes to meds. Encouraged heart healthy diet such as the DASH diet and exercise as tolerated.  °

## 2019-08-10 NOTE — Progress Notes (Signed)
Patient ID: Tina Frost, female    DOB: [birth year]  Age: 49 y.o. MRN: [id number]    Subjective:  Subjective  HPI Tina Frost presents for f/u er for fatigue, dizzy, headaches and sob.  She has in er recently and cxr, ekg normal   bp has been running high --- we started meds 3 days ago.   No other complaints.  No cp  Review of Systems  Constitutional: Positive for fatigue. Negative for appetite change, diaphoresis and unexpected weight change.  Eyes: Negative for pain, redness and visual disturbance.  Respiratory: Positive for shortness of breath. Negative for cough, chest tightness and wheezing.   Cardiovascular: Positive for leg swelling. Negative for chest pain and palpitations.  Endocrine: Negative for cold intolerance, heat intolerance, polydipsia, polyphagia and polyuria.  Genitourinary: Negative for difficulty urinating, dysuria and frequency.  Neurological: Positive for dizziness. Negative for light-headedness and numbness.    History Past Medical History:  Diagnosis Date  . Abdominal pain   . Anxiety   . Atypical nevus 03/17/2017   right calf  . Depression   . History of kidney stones   . Hyperlipidemia     She has a past surgical history that includes Colonoscopy (2002); Appendectomy; Tubal ligation; Kidney stone surgery (2008); Umbilical hernia repair (N/A, 02/11/2015); Insertion of mesh (N/A, 02/11/2015); and Hernia repair (01/2015).   Her family history includes Alzheimer's disease in her maternal grandfather; Breast cancer in her mother; Hyperlipidemia in an other family member; Hypertension in an other family member; Kidney disease in an other family member; Lung cancer in her paternal grandmother; Stroke in her father.She reports that she has never smoked. She has never used smokeless tobacco. She reports that she does not drink alcohol and does not use drugs.  Current Outpatient Medications on File Prior to Visit  Medication Sig Dispense Refill  .  albuterol (VENTOLIN HFA) 108 (90 Base) MCG/ACT inhaler Inhale 2 puffs into the lungs every 6 (six) hours as needed for shortness of breath. 18 g 0  . ALPRAZolam (XANAX) 0.25 MG tablet Take 1 tablet (0.25 mg total) by mouth 3 (three) times daily as needed. for anxiety 30 tablet 0  . DULoxetine (CYMBALTA) 30 MG capsule Take 1 capsule (30 mg total) by mouth daily. 30 capsule 0  . hydrOXYzine (ATARAX/VISTARIL) 10 MG tablet 1-2 tab po prn insomnia 15 tablet 0  . lisinopril-hydrochlorothiazide (ZESTORETIC) 10-12.5 MG tablet Take 1 tablet by mouth daily. 30 tablet 0  . furosemide (LASIX) 20 MG tablet Take 1 tablet (20 mg total) by mouth daily. (Patient not taking: Reported on 07/25/2019) 30 tablet 3  . hydrocortisone (ANUSOL-HC) 25 MG suppository Place 1 suppository (25 mg total) rectally 2 (two) times daily. (Patient not taking: Reported on 07/25/2019) 20 suppository 0  . nitrofurantoin, macrocrystal-monohydrate, (MACROBID) 100 MG capsule Take 1 capsule (100 mg total) by mouth 2 (two) times daily. (Patient not taking: Reported on 07/25/2019) 14 capsule 0   No current facility-administered medications on file prior to visit.     Objective:  Objective  Physical Exam Vitals and nursing note reviewed.  Constitutional:      Appearance: She is well-developed.  HENT:     Head: Normocephalic and atraumatic.  Eyes:     Conjunctiva/sclera: Conjunctivae normal.  Neck:     Thyroid: No thyromegaly.     Vascular: No carotid bruit or JVD.  Cardiovascular:     Rate and Rhythm: Normal rate and regular rhythm.     Heart sounds: Normal  heart sounds. No murmur heard.   Pulmonary:     Effort: Pulmonary effort is normal. No respiratory distress.     Breath sounds: Normal breath sounds. No wheezing or rales.  Chest:     Chest wall: No tenderness.  Musculoskeletal:     Cervical back: Normal range of motion and neck supple.  Neurological:     Mental Status: She is alert and oriented to person, place, and time.     BP 114/80 (BP Location: Left Arm, Patient Position: Sitting, Cuff Size: Normal)   Pulse 85   Temp (!) 97.1 F (36.2 C) (Temporal)   Resp 18   Ht 5\' 8"  (1.727 m)   Wt 192 lb 3.2 oz (87.2 kg)   LMP 08/07/2019   SpO2 99%   BMI 29.22 kg/m  Wt Readings from Last 3 Encounters:  08/10/19 192 lb 3.2 oz (87.2 kg)  08/08/19 191 lb 14.4 oz (87 kg)  07/25/19 198 lb (89.8 kg)     Lab Results  Component Value Date   WBC 5.9 08/08/2019   HGB 16.4 (H) 08/08/2019   HCT 47.0 (H) 08/08/2019   PLT 270 08/08/2019   GLUCOSE 117 (H) 08/08/2019   CHOL 187 10/12/2011   TRIG 69.0 10/12/2011   HDL 57.90 10/12/2011   LDLCALC 115 (H) 10/12/2011   ALT 15 08/08/2019   AST 17 08/08/2019   NA 140 08/08/2019   K 3.4 (L) 08/08/2019   CL 106 08/08/2019   CREATININE 0.73 08/08/2019   BUN 14 08/08/2019   CO2 24 08/08/2019   TSH 0.511 08/08/2019    DG Chest 2 View  Result Date: 08/08/2019 CLINICAL DATA:  Shortness of breath and fatigue for a few weeks, worse today. Headache today. EXAM: CHEST - 2 VIEW COMPARISON:  PA and lateral chest 07/26/2019 and 03/22/2004. FINDINGS: Lungs are clear. Heart size is normal. No pneumothorax or pleural fluid. No acute or focal bony abnormality. IMPRESSION: Negative chest. Electronically Signed   By: Inge Rise M.D.   On: 08/08/2019 15:24     Assessment & Plan:  Plan  I am having Tina Frost maintain her hydrocortisone, ALPRAZolam, furosemide, nitrofurantoin (macrocrystal-monohydrate), hydrOXYzine, albuterol, DULoxetine, and lisinopril-hydrochlorothiazide.  No orders of the defined types were placed in this encounter.   Problem List Items Addressed This Visit      Unprioritized   Dizzy   Relevant Orders   CBC with Differential/Platelet   Comprehensive metabolic panel   Antinuclear Antib (ANA)   Rocky mtn spotted fvr abs pnl(IgG+IgM)   B. burgdorfi antibodies   Elevated hemoglobin ([facility])    Refer to hematology   ? PV       Relevant Orders    Ambulatory referral to Hematology   Essential hypertension - Primary    Well controlled, no changes to meds. Encouraged heart healthy diet such as the DASH diet and exercise as tolerated.       Relevant Orders   ECHOCARDIOGRAM COMPLETE   Other fatigue    Er labs and eval reviewed        Relevant Orders   CBC with Differential/Platelet   Comprehensive metabolic panel   Antinuclear Antib (ANA)   ECHOCARDIOGRAM COMPLETE   Rocky mtn spotted fvr abs pnl(IgG+IgM)   B. burgdorfi antibodies   SOB (shortness of breath)    With htn Check echo      Relevant Orders   CBC with Differential/Platelet   Comprehensive metabolic panel   Antinuclear Antib (ANA)   ECHOCARDIOGRAM COMPLETE  Rocky mtn spotted fvr abs pnl(IgG+IgM)   B. burgdorfi antibodies      Follow-up: Return in about 2 weeks (around 08/24/2019), or if symptoms worsen or fail to improve.  Ann Held, DO

## 2019-08-11 LAB — ROCKY MTN SPOTTED FVR ABS PNL(IGG+IGM)
RMSF IgG: NOT DETECTED
RMSF IgM: NOT DETECTED

## 2019-08-11 LAB — ANA: Anti Nuclear Antibody (ANA): NEGATIVE

## 2019-08-11 LAB — B. BURGDORFI ANTIBODIES: B burgdorferi Ab IgG+IgM: <0.9 index

## 2019-08-14 ENCOUNTER — Telehealth: Payer: Self-pay | Admitting: Family

## 2019-08-14 NOTE — Telephone Encounter (Signed)
lmom to inform patient of new referral appointment 7/22 at 1015 am

## 2019-08-17 ENCOUNTER — Telehealth: Payer: Self-pay | Admitting: Family Medicine

## 2019-08-17 NOTE — Telephone Encounter (Signed)
Caller: Anderson Malta Call back phone number: [phone]  Patient states she was supposed to get a referral for a cardiology for an ultrasound of her heart.    Please advise.

## 2019-08-17 NOTE — Telephone Encounter (Signed)
Echo order still pending. Can you check status please?

## 2019-08-21 NOTE — Telephone Encounter (Signed)
Noted  

## 2019-08-21 NOTE — Telephone Encounter (Signed)
Pt is scheduled for echo tomorrow at [facility]. No Josem Kaufmann is required. There is not a cardiology referral in the system at this time.

## 2019-08-22 ENCOUNTER — Other Ambulatory Visit: Payer: Self-pay

## 2019-08-22 ENCOUNTER — Ambulatory Visit (HOSPITAL_COMMUNITY)
Admission: RE | Admit: 2019-08-22 | Discharge: 2019-08-22 | Disposition: A | Discharge status: Home / Self Care | Payer: No Typology Code available for payment source | Source: Ambulatory Visit | Attending: Family Medicine | Admitting: Family Medicine

## 2019-08-22 DIAGNOSIS — E785 Hyperlipidemia, unspecified: Secondary | ICD-10-CM | POA: Insufficient documentation

## 2019-08-22 DIAGNOSIS — I10 Essential (primary) hypertension: Secondary | ICD-10-CM

## 2019-08-22 DIAGNOSIS — I083 Combined rheumatic disorders of mitral, aortic and tricuspid valves: Secondary | ICD-10-CM | POA: Diagnosis not present

## 2019-08-22 DIAGNOSIS — R0602 Shortness of breath: Secondary | ICD-10-CM

## 2019-08-22 DIAGNOSIS — R5383 Other fatigue: Secondary | ICD-10-CM

## 2019-08-22 NOTE — Progress Notes (Signed)
Echocardiogram 2D Echocardiogram has been performed.  Oneal Deputy Kiaria Quinnell 08/22/2019, 9:20 AM

## 2019-08-31 ENCOUNTER — Other Ambulatory Visit: Payer: Self-pay | Admitting: Family

## 2019-08-31 DIAGNOSIS — D751 Secondary polycythemia: Secondary | ICD-10-CM

## 2019-09-01 ENCOUNTER — Inpatient Hospital Stay: Payer: No Typology Code available for payment source | Attending: Hematology & Oncology

## 2019-09-01 ENCOUNTER — Other Ambulatory Visit: Payer: Self-pay

## 2019-09-01 ENCOUNTER — Encounter: Payer: Self-pay | Admitting: Family

## 2019-09-01 ENCOUNTER — Inpatient Hospital Stay (HOSPITAL_BASED_OUTPATIENT_CLINIC_OR_DEPARTMENT_OTHER): Payer: No Typology Code available for payment source | Admitting: Family

## 2019-09-01 VITALS — BP 142/95 | HR 85 | Temp 99.2°F | Resp 16 | Ht 68.0 in | Wt 196.0 lb

## 2019-09-01 DIAGNOSIS — Z8249 Family history of ischemic heart disease and other diseases of the circulatory system: Secondary | ICD-10-CM | POA: Insufficient documentation

## 2019-09-01 DIAGNOSIS — D751 Secondary polycythemia: Secondary | ICD-10-CM

## 2019-09-01 DIAGNOSIS — Z79899 Other long term (current) drug therapy: Secondary | ICD-10-CM

## 2019-09-01 DIAGNOSIS — Z87442 Personal history of urinary calculi: Secondary | ICD-10-CM

## 2019-09-01 DIAGNOSIS — Z803 Family history of malignant neoplasm of breast: Secondary | ICD-10-CM | POA: Insufficient documentation

## 2019-09-01 DIAGNOSIS — I10 Essential (primary) hypertension: Secondary | ICD-10-CM | POA: Insufficient documentation

## 2019-09-01 DIAGNOSIS — Z8349 Family history of other endocrine, nutritional and metabolic diseases: Secondary | ICD-10-CM

## 2019-09-01 DIAGNOSIS — F419 Anxiety disorder, unspecified: Secondary | ICD-10-CM | POA: Insufficient documentation

## 2019-09-01 DIAGNOSIS — F329 Major depressive disorder, single episode, unspecified: Secondary | ICD-10-CM | POA: Diagnosis not present

## 2019-09-01 DIAGNOSIS — E785 Hyperlipidemia, unspecified: Secondary | ICD-10-CM

## 2019-09-01 DIAGNOSIS — Z801 Family history of malignant neoplasm of trachea, bronchus and lung: Secondary | ICD-10-CM | POA: Diagnosis not present

## 2019-09-01 LAB — CMP (CANCER CENTER ONLY)
ALT: 20 U/L (ref 0–44)
AST: 15 U/L (ref 15–41)
Albumin: 4.3 g/dL (ref 3.5–5.0)
Alkaline Phosphatase: 55 U/L (ref 38–126)
Anion gap: 7 (ref 5–15)
BUN: 14 mg/dL (ref 6–20)
CO2: 27 mmol/L (ref 22–32)
Calcium: 9.8 mg/dL (ref 8.9–10.3)
Chloride: 107 mmol/L (ref 98–111)
Creatinine: 0.61 mg/dL (ref 0.44–1.00)
GFR, Est AFR Am: >60 mL/min (ref >60)
GFR, Estimated: >60 mL/min (ref >60)
Glucose, Bld: 104 mg/dL — ABNORMAL HIGH (ref 70–99)
Potassium: 3.6 mmol/L (ref 3.5–5.1)
Sodium: 141 mmol/L (ref 135–145)
Total Bilirubin: 1.1 mg/dL (ref 0.3–1.2)
Total Protein: 6.5 g/dL (ref 6.5–8.1)

## 2019-09-01 LAB — CBC WITH DIFFERENTIAL (CANCER CENTER ONLY)
Abs Immature Granulocytes: 0.02 10*3/uL (ref 0.00–0.07)
Basophils Absolute: 0.1 10*3/uL (ref 0.0–0.1)
Basophils Relative: 1 %
Eosinophils Absolute: 0.1 10*3/uL (ref 0.0–0.5)
Eosinophils Relative: 2 %
HCT: 42.2 % (ref 36.0–46.0)
Hemoglobin: 15 g/dL (ref 12.0–15.0)
Immature Granulocytes: 0 %
Lymphocytes Relative: 29 %
Lymphs Abs: 1.9 10*3/uL (ref 0.7–4.0)
MCH: 32.1 pg (ref 26.0–34.0)
MCHC: 35.5 g/dL (ref 30.0–36.0)
MCV: 90.2 fL (ref 80.0–100.0)
Monocytes Absolute: 0.3 10*3/uL (ref 0.1–1.0)
Monocytes Relative: 5 %
Neutro Abs: 4.1 10*3/uL (ref 1.7–7.7)
Neutrophils Relative %: 63 %
Platelet Count: 241 10*3/uL (ref 150–400)
RBC: 4.68 MIL/uL (ref 3.87–5.11)
RDW: 12.9 % (ref 11.5–15.5)
WBC Count: 6.5 10*3/uL (ref 4.0–10.5)
nRBC: 0 % (ref 0.0–0.2)

## 2019-09-01 LAB — SAVE SMEAR(SSMR), FOR PROVIDER SLIDE REVIEW

## 2019-09-01 NOTE — Progress Notes (Addendum)
Hematology/Oncology Consultation   Name: Tina Frost      MRN: [id number]    Location: Room/bed info not found  Date: 09/01/2019 Time:2:19 PM   REFERRING PHYSICIAN: Garnet Koyanagi Chase, DO  REASON FOR CONSULT: Elevated Hgb    DIAGNOSIS: Elevated Hgb  HISTORY OF PRESENT ILLNESS: Tina Frost is a very pleasant 49 yo caucasian female with a several year history of intermittent elevated Hgb. Hgb today is 15.0, MCV 90, WBC count 6.5 and platelets 241.  She does not smoke, use recreational drugs or drink alcohol.  She has never been tested for sleep apnea.  She was diagnosed with HTN a month or so ago and started Zestoretic. She states that she is tolerating this well.  Recent ECHO showed an EF of 55-60%.  She states that she feels fatigued, irritable, SOB with over exertion and has occasional dizziness.  She notes occasional hot flashes.  She has had some abdominal bloating at times.  She still has a cycle which she describes as regular with a normal to heavy flow. She has an IUD in place at this time.  She has history of intermittent iron deficiency anemia.  She states that she has history of bilateral kidney stones and associated back/flank pain.   She states that she has had swelling in her lower extremities that waxes and wanes for around 2 years.  No tenderness, numbness or tingling in her extremities at this time.  No falls or syncopal episodes to report.  No fever, chills, n/v, cough, rash, chest pain, palpitations, abdominal pain or changes in bowel or bladder habits.  No personal or familial history of liver disease or failure.  No history of diabetes or thyroid disease.  Her father had history of a stroke.  No personal history of cancer. Her mother had breast cancer and paternal grandmother had lung cancer.  She has maintained a good appetite and is staying well hydrated. Her weight is stable.  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   ROS: All other 10 point review of systems  is negative.   PAST MEDICAL HISTORY:   Past Medical History:  Diagnosis Date  . Abdominal pain   . Anxiety   . Atypical nevus 03/17/2017   right calf  . Depression   . History of kidney stones   . Hyperlipidemia     ALLERGIES: Allergies  Allergen Reactions  . Amoxicillin Other (See Comments)    Causes kidney stones   . Sulfa Antibiotics     Causes Kidney stones      MEDICATIONS:  Current Outpatient Medications on File Prior to Visit  Medication Sig Dispense Refill  . albuterol (VENTOLIN HFA) 108 (90 Base) MCG/ACT inhaler Inhale 2 puffs into the lungs every 6 (six) hours as needed for shortness of breath. 18 g 0  . ALPRAZolam (XANAX) 0.25 MG tablet Take 1 tablet (0.25 mg total) by mouth 3 (three) times daily as needed. for anxiety 30 tablet 0  . DULoxetine (CYMBALTA) 30 MG capsule Take 1 capsule (30 mg total) by mouth daily. 30 capsule 0  . furosemide (LASIX) 20 MG tablet Take 1 tablet (20 mg total) by mouth daily. (Patient not taking: Reported on 07/25/2019) 30 tablet 3  . hydrocortisone (ANUSOL-HC) 25 MG suppository Place 1 suppository (25 mg total) rectally 2 (two) times daily. (Patient not taking: Reported on 07/25/2019) 20 suppository 0  . hydrOXYzine (ATARAX/VISTARIL) 10 MG tablet 1-2 tab po prn insomnia 15 tablet 0  . lisinopril-hydrochlorothiazide (  ZESTORETIC) 10-12.5 MG tablet Take 1 tablet by mouth daily. 30 tablet 0  . nitrofurantoin, macrocrystal-monohydrate, (MACROBID) 100 MG capsule Take 1 capsule (100 mg total) by mouth 2 (two) times daily. (Patient not taking: Reported on 07/25/2019) 14 capsule 0   No current facility-administered medications on file prior to visit.     PAST SURGICAL HISTORY Past Surgical History:  Procedure Laterality Date  . APPENDECTOMY    . COLONOSCOPY  2002  . HERNIA REPAIR  01/2015  . INSERTION OF MESH N/A 02/11/2015   Procedure: INSERTION OF MESH;  Surgeon: Ralene Ok, MD;  Location: [place];  Service: General;  Laterality: N/A;  .  KIDNEY STONE SURGERY  2008  . TUBAL LIGATION    . UMBILICAL HERNIA REPAIR N/A 02/11/2015   Procedure: LAPAROSCOPIC UMBILICAL HERNIA REPAIR WITH MESH;  Surgeon: Ralene Ok, MD;  Location: [facility];  Service: General;  Laterality: N/A;    FAMILY HISTORY: Family History  Problem Relation Age of Onset  . Breast cancer Mother   . Lung cancer Paternal Grandmother   . Stroke Father   . Hypertension Other   . Hyperlipidemia Other   . Kidney disease Other        kidney stones  . Alzheimer's disease Maternal Grandfather   . Colon cancer Neg Hx   . Rectal cancer Neg Hx   . Esophageal cancer Neg Hx   . Liver cancer Neg Hx     SOCIAL HISTORY:  reports that she has never smoked. She has never used smokeless tobacco. She reports that she does not drink alcohol and does not use drugs.  PERFORMANCE STATUS: The patient's performance status is 1 - Symptomatic but completely ambulatory  PHYSICAL EXAM: Most Recent Vital Signs: Last menstrual period 08/07/2019. LMP 08/07/2019   General Appearance:    Alert, cooperative, no distress, appears stated age  Head:    Normocephalic, without obvious abnormality, atraumatic  Eyes:    PERRL, conjunctiva/corneas clear, EOM's intact, fundi    benign, both eyes        Throat:   Lips, mucosa, and tongue normal; teeth and gums normal  Neck:   Supple, symmetrical, trachea midline, no adenopathy;    thyroid:  no enlargement/tenderness/nodules; no carotid   bruit or JVD  Back:     Symmetric, no curvature, ROM normal, no CVA tenderness  Lungs:     Clear to auscultation bilaterally, respirations unlabored  Chest Wall:    No tenderness or deformity   Heart:    Regular rate and rhythm, S1 and S2 normal, no murmur, rub   or gallop     Abdomen:     Soft, non-tender, bowel sounds active all four quadrants,    no masses, no organomegaly        Extremities:   Extremities normal, atraumatic, no cyanosis or edema  Pulses:   2+ and symmetric all extremities   Skin:   Skin color, texture, turgor normal, no rashes or lesions  Lymph nodes:   Cervical, supraclavicular, and axillary nodes normal  Neurologic:   CNII-XII intact, normal strength, sensation and reflexes    throughout    LABORATORY DATA:  Results for orders placed or performed in visit on 09/01/19 (from the past 48 hour(s))  CBC with Differential (Cancer Center Only)     Status: None   Collection Time: 09/01/19  1:56 PM  Result Value Ref Range   WBC Count 6.5 4.0 - 10.5 K/uL   RBC 4.68 3.87 - 5.11 MIL/uL  Hemoglobin 15.0 12.0 - 15.0 g/dL   HCT 42.2 36 - 46 %   MCV 90.2 80.0 - 100.0 fL   MCH 32.1 26.0 - 34.0 pg   MCHC 35.5 30.0 - 36.0 g/dL   RDW 12.9 11.5 - 15.5 %   Platelet Count 241 150 - 400 K/uL   nRBC 0.0 0.0 - 0.2 %   Neutrophils Relative % 63 %   Neutro Abs 4.1 1.7 - 7.7 K/uL   Lymphocytes Relative 29 %   Lymphs Abs 1.9 0.7 - 4.0 K/uL   Monocytes Relative 5 %   Monocytes Absolute 0.3 0 - 1 K/uL   Eosinophils Relative 2 %   Eosinophils Absolute 0.1 0 - 0 K/uL   Basophils Relative 1 %   Basophils Absolute 0.1 0 - 0 K/uL   Immature Granulocytes 0 %   Abs Immature Granulocytes 0.02 0.00 - 0.07 K/uL    Comment: Performed at [facility] at [facility], [address] [place], [place] [address]  Save Smear [facility])     Status: None   Collection Time: 09/01/19  1:56 PM  Result Value Ref Range   Smear Review SMEAR STAINED AND AVAILABLE FOR REVIEW     Comment: Performed at [facility] at [facility], [address] [place], [place] [address]      RADIOGRAPHY: No results found.     PATHOLOGY: None  ASSESSMENT/PLAN: Ms. Fairbank is a very pleasant 49 yo caucasian female with a several year history of intermittent elevated Hgb. Hgb today is normal. She has symptoms of fatigue and SOB with exertion as mentioned above.  We will see what her Hemochromatosis DNA shows and get her scheduled next week for JAK2 testing.  If  our work up here is negative we would suggest she move forward with a sleep study to assess for apnea which can cause secondary erythrocytosis.  We will follow-up with once we have all lab results in hand.   All questions were answered and she is in agreement with the plan. She can contact our office with any questions or concerns. We can certainly see her sooner if needed.   Laverna Peace, NP

## 2019-09-04 ENCOUNTER — Other Ambulatory Visit: Payer: Self-pay

## 2019-09-04 ENCOUNTER — Inpatient Hospital Stay: Payer: No Typology Code available for payment source

## 2019-09-04 DIAGNOSIS — D751 Secondary polycythemia: Secondary | ICD-10-CM

## 2019-09-04 LAB — LACTATE DEHYDROGENASE: LDH: 155 U/L (ref 98–192)

## 2019-09-04 LAB — ERYTHROPOIETIN: Erythropoietin: 7.3 m[IU]/mL (ref 2.6–18.5)

## 2019-09-04 LAB — IRON AND TIBC
Iron: 209 ug/dL — ABNORMAL HIGH (ref 41–142)
Saturation Ratios: 78 % — ABNORMAL HIGH (ref 21–57)
TIBC: 268 ug/dL (ref 236–444)
UIBC: 58 ug/dL — ABNORMAL LOW (ref 120–384)

## 2019-09-04 LAB — FERRITIN: Ferritin: 114 ng/mL (ref 11–307)

## 2019-09-05 ENCOUNTER — Ambulatory Visit (INDEPENDENT_AMBULATORY_CARE_PROVIDER_SITE_OTHER): Payer: No Typology Code available for payment source | Admitting: Family Medicine

## 2019-09-05 ENCOUNTER — Encounter: Payer: Self-pay | Admitting: Family Medicine

## 2019-09-05 VITALS — BP 120/88 | HR 77 | Temp 98.1°F | Resp 18 | Ht 68.0 in | Wt 197.0 lb

## 2019-09-05 DIAGNOSIS — F320 Major depressive disorder, single episode, mild: Secondary | ICD-10-CM | POA: Diagnosis not present

## 2019-09-05 DIAGNOSIS — I10 Essential (primary) hypertension: Secondary | ICD-10-CM | POA: Diagnosis not present

## 2019-09-05 MED ORDER — LISINOPRIL-HYDROCHLOROTHIAZIDE 20-12.5 MG PO TABS: 1.0000 | ORAL_TABLET | Freq: Every day | ORAL | 3 refills | Status: DC

## 2019-09-05 MED ORDER — DULOXETINE HCL 20 MG PO CPEP: 20.0000 mg | ORAL_CAPSULE | Freq: Every day | ORAL | 3 refills | Status: DC

## 2019-09-05 NOTE — Patient Instructions (Signed)
DASH Eating Plan DASH stands for "Dietary Approaches to Stop Hypertension." The DASH eating plan is a healthy eating plan that has been shown to reduce high blood pressure (hypertension). It may also reduce your risk for type 2 diabetes, heart disease, and stroke. The DASH eating plan may also help with weight loss. What are tips for following this plan?  General guidelines  Avoid eating more than 2,300 mg (milligrams) of salt (sodium) a day. If you have hypertension, you may need to reduce your sodium intake to 1,500 mg a day.  Limit alcohol intake to no more than 1 drink a day for nonpregnant women and 2 drinks a day for men. One drink equals 12 oz of beer, 5 oz of wine, or 1 oz of hard liquor.  Work with your health care provider to maintain a healthy body weight or to lose weight. Ask what an ideal weight is for you.  Get at least 30 minutes of exercise that causes your heart to beat faster (aerobic exercise) most days of the week. Activities may include walking, swimming, or biking.  Work with your health care provider or diet and nutrition specialist (dietitian) to adjust your eating plan to your individual calorie needs. Reading food labels   Check food labels for the amount of sodium per serving. Choose foods with less than 5 percent of the Daily Value of sodium. Generally, foods with less than 300 mg of sodium per serving fit into this eating plan.  To find whole grains, look for the word "whole" as the first word in the ingredient list. Shopping  Buy products labeled as "low-sodium" or "no salt added."  Buy fresh foods. Avoid canned foods and premade or frozen meals. Cooking  Avoid adding salt when cooking. Use salt-free seasonings or herbs instead of table salt or sea salt. Check with your health care provider or pharmacist before using salt substitutes.  Do not fry foods. Cook foods using healthy methods such as baking, boiling, grilling, and broiling instead.  Cook with  heart-healthy oils, such as olive, canola, soybean, or sunflower oil. Meal planning  Eat a balanced diet that includes: ? 5 or more servings of fruits and vegetables each day. At each meal, try to fill half of your plate with fruits and vegetables. ? Up to 6-8 servings of whole grains each day. ? Less than 6 oz of lean meat, poultry, or fish each day. A 3-oz serving of meat is about the same size as a deck of cards. One egg equals 1 oz. ? 2 servings of low-fat dairy each day. ? A serving of nuts, seeds, or beans 5 times each week. ? Heart-healthy fats. Healthy fats called Omega-3 fatty acids are found in foods such as flaxseeds and coldwater fish, like sardines, salmon, and mackerel.  Limit how much you eat of the following: ? Canned or prepackaged foods. ? Food that is high in trans fat, such as fried foods. ? Food that is high in saturated fat, such as fatty meat. ? Sweets, desserts, sugary drinks, and other foods with added sugar. ? Full-fat dairy products.  Do not salt foods before eating.  Try to eat at least 2 vegetarian meals each week.  Eat more home-cooked food and less restaurant, buffet, and fast food.  When eating at a restaurant, ask that your food be prepared with less salt or no salt, if possible. What foods are recommended? The items listed may not be a complete list. Talk with your dietitian about   what dietary choices are best for you. Grains Whole-grain or whole-wheat bread. Whole-grain or whole-wheat pasta. Brown rice. Oatmeal. Quinoa. Bulgur. Whole-grain and low-sodium cereals. Pita bread. Low-fat, low-sodium crackers. Whole-wheat flour tortillas. Vegetables Fresh or frozen vegetables (raw, steamed, roasted, or grilled). Low-sodium or reduced-sodium tomato and vegetable juice. Low-sodium or reduced-sodium tomato sauce and tomato paste. Low-sodium or reduced-sodium canned vegetables. Fruits All fresh, dried, or frozen fruit. Canned fruit in natural juice (without  added sugar). Meat and other protein foods Skinless chicken or turkey. Ground chicken or turkey. Pork with fat trimmed off. Fish and seafood. Egg whites. Dried beans, peas, or lentils. Unsalted nuts, nut butters, and seeds. Unsalted canned beans. Lean cuts of beef with fat trimmed off. Low-sodium, lean deli meat. Dairy Low-fat (1%) or fat-free (skim) milk. Fat-free, low-fat, or reduced-fat cheeses. Nonfat, low-sodium ricotta or cottage cheese. Low-fat or nonfat yogurt. Low-fat, low-sodium cheese. Fats and oils Soft margarine without trans fats. Vegetable oil. Low-fat, reduced-fat, or light mayonnaise and salad dressings (reduced-sodium). Canola, safflower, olive, soybean, and sunflower oils. Avocado. Seasoning and other foods Herbs. Spices. Seasoning mixes without salt. Unsalted popcorn and pretzels. Fat-free sweets. What foods are not recommended? The items listed may not be a complete list. Talk with your dietitian about what dietary choices are best for you. Grains Baked goods made with fat, such as croissants, muffins, or some breads. Dry pasta or rice meal packs. Vegetables Creamed or fried vegetables. Vegetables in a cheese sauce. Regular canned vegetables (not low-sodium or reduced-sodium). Regular canned tomato sauce and paste (not low-sodium or reduced-sodium). Regular tomato and vegetable juice (not low-sodium or reduced-sodium). Pickles. Olives. Fruits Canned fruit in a light or heavy syrup. Fried fruit. Fruit in cream or butter sauce. Meat and other protein foods Fatty cuts of meat. Ribs. Fried meat. Bacon. Sausage. Bologna and other processed lunch meats. Salami. Fatback. Hotdogs. Bratwurst. Salted nuts and seeds. Canned beans with added salt. Canned or smoked fish. Whole eggs or egg yolks. Chicken or turkey with skin. Dairy Whole or 2% milk, cream, and half-and-half. Whole or full-fat cream cheese. Whole-fat or sweetened yogurt. Full-fat cheese. Nondairy creamers. Whipped toppings.  Processed cheese and cheese spreads. Fats and oils Butter. Stick margarine. Lard. Shortening. Ghee. Bacon fat. Tropical oils, such as coconut, palm kernel, or palm oil. Seasoning and other foods Salted popcorn and pretzels. Onion salt, garlic salt, seasoned salt, table salt, and sea salt. Worcestershire sauce. Tartar sauce. Barbecue sauce. Teriyaki sauce. Soy sauce, including reduced-sodium. Steak sauce. Canned and packaged gravies. Fish sauce. Oyster sauce. Cocktail sauce. Horseradish that you find on the shelf. Ketchup. Mustard. Meat flavorings and tenderizers. Bouillon cubes. Hot sauce and Tabasco sauce. Premade or packaged marinades. Premade or packaged taco seasonings. Relishes. Regular salad dressings. Where to find more information:  National Heart, Lung, and Blood Institute: www.nhlbi.nih.gov  American Heart Association: www.heart.org Summary  The DASH eating plan is a healthy eating plan that has been shown to reduce high blood pressure (hypertension). It may also reduce your risk for type 2 diabetes, heart disease, and stroke.  With the DASH eating plan, you should limit salt (sodium) intake to 2,300 mg a day. If you have hypertension, you may need to reduce your sodium intake to 1,500 mg a day.  When on the DASH eating plan, aim to eat more fresh fruits and vegetables, whole grains, lean proteins, low-fat dairy, and heart-healthy fats.  Work with your health care provider or diet and nutrition specialist (dietitian) to adjust your eating plan to your   individual calorie needs. This information is not intended to replace advice given to you by your health care provider. Make sure you discuss any questions you have with your health care provider. Document Revised: 01/22/2017 Document Reviewed: 02/03/2016 Elsevier Patient Education  2020 [employer]  

## 2019-09-05 NOTE — Progress Notes (Signed)
Patient ID: Tina Frost, female    DOB: 1970-09-04  Age: 49 y.o. MRN: [id number]    Subjective:  Subjective  HPI Tina Frost presents for f/u depression and htn    Her dizziness is better but she is having headaches and sob  No chest pain or palpitations    Review of Systems  History Past Medical History:  Diagnosis Date  . Abdominal pain   . Anxiety   . Atypical nevus 03/17/2017   right calf  . Depression   . History of kidney stones   . Hyperlipidemia     She has a past surgical history that includes Colonoscopy (2002); Appendectomy; Tubal ligation; Kidney stone surgery (2008); Umbilical hernia repair (N/A, 02/11/2015); Insertion of mesh (N/A, 02/11/2015); and Hernia repair (01/2015).   Her family history includes Alzheimer's disease in her maternal grandfather; Breast cancer in her mother; Hyperlipidemia in an other family member; Hypertension in an other family member; Kidney disease in an other family member; Lung cancer in her paternal grandmother; Stroke in her father.She reports that she has never smoked. She has never used smokeless tobacco. She reports that she does not drink alcohol and does not use drugs.  Current Outpatient Medications on File Prior to Visit  Medication Sig Dispense Refill  . albuterol (VENTOLIN HFA) 108 (90 Base) MCG/ACT inhaler Inhale 2 puffs into the lungs every 6 (six) hours as needed for shortness of breath. 18 g 0  . ALPRAZolam (XANAX) 0.25 MG tablet Take 1 tablet (0.25 mg total) by mouth 3 (three) times daily as needed. for anxiety 30 tablet 0  . levonorgestrel (MIRENA, 52 MG,) 20 MCG/24HR IUD Mirena 20 mcg/24 hours (6 yrs) 52 mg intrauterine device  Take 1 device by intrauterine route.    . NON FORMULARY Take 0.75 mLs by mouth 2 (two) times daily. CBD Hemp Oil     No current facility-administered medications on file prior to visit.     Objective:  Objective  Physical Exam Vitals and nursing note reviewed.  Constitutional:       Appearance: She is well-developed.  HENT:     Head: Normocephalic and atraumatic.  Eyes:     Conjunctiva/sclera: Conjunctivae normal.  Neck:     Thyroid: No thyromegaly.     Vascular: No carotid bruit or JVD.  Cardiovascular:     Rate and Rhythm: Normal rate and regular rhythm.     Heart sounds: Normal heart sounds. No murmur heard.   Pulmonary:     Effort: Pulmonary effort is normal. No respiratory distress.     Breath sounds: Normal breath sounds. No wheezing or rales.  Chest:     Chest wall: No tenderness.  Musculoskeletal:     Cervical back: Normal range of motion and neck supple.  Neurological:     Mental Status: She is alert and oriented to person, place, and time.    BP 120/88 (BP Location: Right Arm, Patient Position: Sitting, Cuff Size: Normal)   Pulse 77   Temp 98.1 F (36.7 C) (Oral)   Resp 18   Ht 5\' 8"  (1.727 m)   Wt 197 lb (89.4 kg)   LMP 08/07/2019   SpO2 98%   BMI 29.95 kg/m  Wt Readings from Last 3 Encounters:  09/05/19 197 lb (89.4 kg)  09/01/19 196 lb (88.9 kg)  08/10/19 192 lb 3.2 oz (87.2 kg)     Lab Results  Component Value Date   WBC 6.5 09/01/2019   HGB 15.0 09/01/2019  HCT 42.2 09/01/2019   PLT 241 09/01/2019   GLUCOSE 104 (H) 09/01/2019   CHOL 187 09/05/2019   TRIG 116.0 09/05/2019   HDL 56.60 09/05/2019   LDLCALC 107 (H) 09/05/2019   ALT 20 09/01/2019   AST 15 09/01/2019   NA 141 09/01/2019   K 3.6 09/01/2019   CL 107 09/01/2019   CREATININE 0.61 09/01/2019   BUN 14 09/01/2019   CO2 27 09/01/2019   TSH 0.511 08/08/2019    ECHOCARDIOGRAM COMPLETE  Result Date: 08/22/2019    ECHOCARDIOGRAM REPORT   Patient Name:   Tina Frost Date of Exam: 08/22/2019 Medical Rec #:  [id number]        Height:       68.0 in Accession #:    [id number]       Weight:       192.2 lb Date of Birth:  1970/03/04        BSA:          2.009 m Patient Age:    12 years         BP:           129/96 mmHg Patient Gender: F                HR:           65  bpm. Exam Location:  Outpatient Procedure: 2D Echo, 3D Echo, Color Doppler, Cardiac Doppler and Strain Analysis Indications:    R06.9 DOE  History:        Patient has no prior history of Echocardiogram examinations.                 Risk Factors:Hypertension and Dyslipidemia.  Sonographer:    Raquel Sarna Senior RDCS Referring Phys: [place]  1. Left ventricular ejection fraction, by estimation, is 55 to 60%. The left ventricle has normal function. The left ventricle has no regional wall motion abnormalities. Left ventricular diastolic parameters are indeterminate. The average left ventricular global longitudinal strain is -17.8 %. The global longitudinal strain is normal.  2. Right ventricular systolic function is normal. The right ventricular size is normal. There is normal pulmonary artery systolic pressure.  3. Left atrial size was mildly dilated.  4. The mitral valve is normal in structure. Trivial mitral valve regurgitation. No evidence of mitral stenosis.  5. The aortic valve is tricuspid. Aortic valve regurgitation is not visualized. No aortic stenosis is present.  6. The inferior vena cava is normal in size with greater than 50% respiratory variability, suggesting right atrial pressure of 3 mmHg. FINDINGS  Left Ventricle: Left ventricular ejection fraction, by estimation, is 55 to 60%. The left ventricle has normal function. The left ventricle has no regional wall motion abnormalities. The average left ventricular global longitudinal strain is -17.8 %. The global longitudinal strain is normal. Global longitudinal strain performed but not reported based on interpreter judgement due to suboptimal tracking. The left ventricular internal cavity size was normal in size. There is no left ventricular hypertrophy. Left ventricular diastolic parameters are indeterminate. Right Ventricle: The right ventricular size is normal. No increase in right ventricular wall thickness. Right ventricular  systolic function is normal. There is normal pulmonary artery systolic pressure. The tricuspid regurgitant velocity is 1.36 m/s, and  with an assumed right atrial pressure of 3 mmHg, the estimated right ventricular systolic pressure is 28.4 mmHg. Left Atrium: Left atrial size was mildly dilated. Right Atrium: Right atrial size was normal in size.  Pericardium: There is no evidence of pericardial effusion. Mitral Valve: The mitral valve is normal in structure. Normal mobility of the mitral valve leaflets. Trivial mitral valve regurgitation. No evidence of mitral valve stenosis. Tricuspid Valve: The tricuspid valve is normal in structure. Tricuspid valve regurgitation is trivial. No evidence of tricuspid stenosis. Aortic Valve: The aortic valve is tricuspid. Aortic valve regurgitation is not visualized. No aortic stenosis is present. Pulmonic Valve: The pulmonic valve was normal in structure. Pulmonic valve regurgitation is not visualized. No evidence of pulmonic stenosis. Aorta: The aortic root is normal in size and structure. Venous: The inferior vena cava is normal in size with greater than 50% respiratory variability, suggesting right atrial pressure of 3 mmHg. IAS/Shunts: No atrial level shunt detected by color flow Doppler.  LEFT VENTRICLE PLAX 2D LVIDd:         4.60 cm  Diastology LVIDs:         3.20 cm  LV e' lateral:   9.03 cm/s LV PW:         0.87 cm  LV E/e' lateral: 9.4 LV IVS:        0.90 cm  LV e' medial:    7.51 cm/s LVOT diam:     2.10 cm  LV E/e' medial:  11.3 LV SV:         77 LV SV Index:   38       2D Longitudinal Strain LVOT Area:     3.46 cm 2D Strain GLS (A2C):   -20.0 %                         2D Strain GLS (A3C):   -16.7 %                         2D Strain GLS (A4C):   -16.7 %                         2D Strain GLS Avg:     -17.8 %                          3D Volume EF:                         3D EF:        58 %                         LV EDV:       177 ml                         LV ESV:        74 ml                         LV SV:        103 ml RIGHT VENTRICLE RV S prime:     12.70 cm/s TAPSE (M-mode): 2.7 cm LEFT ATRIUM             Index       RIGHT ATRIUM           Index LA diam:        3.30 cm 1.64 cm/m  RA Area:     17.60 cm LA  Vol [facility]):   60.9 ml 30.31 ml/m RA Volume:   43.90 ml  21.85 ml/m LA Vol (A4C):   51.0 ml 25.38 ml/m LA Biplane Vol: 56.8 ml 28.27 ml/m  AORTIC VALVE LVOT Vmax:   106.00 cm/s LVOT Vmean:  68.200 cm/s LVOT VTI:    0.223 m  AORTA Ao Root diam: 3.40 cm Ao Asc diam:  3.40 cm MITRAL VALVE               TRICUSPID VALVE MV Area (PHT): 3.17 cm    TR Peak grad:   7.4 mmHg MV Decel Time: 239 msec    TR Vmax:        136.00 cm/s MV E velocity: 84.60 cm/s MV A velocity: 62.20 cm/s  SHUNTS MV E/A ratio:  1.36        Systemic VTI:  0.22 m                            Systemic Diam: 2.10 cm Skeet Latch MD Electronically signed by Skeet Latch MD Signature Date/Time: 08/22/2019/10:26:18 AM    Final      Assessment & Plan:  Plan  I have discontinued [name] Serrao's DULoxetine and lisinopril-hydrochlorothiazide. I am also having her start on DULoxetine and lisinopril-hydrochlorothiazide. Additionally, I am having her maintain her ALPRAZolam, albuterol, Mirena (52 MG), and NON FORMULARY.  Meds ordered this encounter  Medications  . DULoxetine (CYMBALTA) 20 MG capsule    Sig: Take 1 capsule (20 mg total) by mouth daily.    Dispense:  30 capsule    Refill:  3  . lisinopril-hydrochlorothiazide (ZESTORETIC) 20-12.5 MG tablet    Sig: Take 1 tablet by mouth daily.    Dispense:  90 tablet    Refill:  3    Problem List Items Addressed This Visit      Unprioritized   Depression, major, single episode, mild ([place]) - Primary    Pt has been doing well and woul d like to come off the cymbalta Dec to 20 mg daily and try to stop after 1-2 months      Relevant Medications   DULoxetine (CYMBALTA) 20 MG capsule   Essential hypertension    Poorly controlled will alter  medications, encouraged DASH diet, minimize caffeine and obtain adequate sleep. Report concerning symptoms and follow up as directed and as needed       Relevant Medications   lisinopril-hydrochlorothiazide (ZESTORETIC) 20-12.5 MG tablet   Other Relevant Orders   Ambulatory referral to Cardiology   Lipid panel (Completed)      Follow-up: Return in about 3 months (around 12/06/2019), or if symptoms worsen or fail to improve.  Ann Held, DO

## 2019-09-06 LAB — LIPID PANEL
Cholesterol: 187 mg/dL (ref 0–200)
HDL: 56.6 mg/dL (ref >39.00)
LDL Cholesterol: 107 mg/dL — ABNORMAL HIGH (ref 0–99)
NonHDL: 130.66
Total CHOL/HDL Ratio: 3
Triglycerides: 116 mg/dL (ref 0.0–149.0)
VLDL: 23.2 mg/dL (ref 0.0–40.0)

## 2019-09-06 LAB — HEMOCHROMATOSIS DNA-PCR(C282Y,H63D)

## 2019-09-10 DIAGNOSIS — F320 Major depressive disorder, single episode, mild: Secondary | ICD-10-CM | POA: Insufficient documentation

## 2019-09-10 HISTORY — DX: Major depressive disorder, single episode, mild: F32.0

## 2019-09-10 NOTE — Assessment & Plan Note (Signed)
Pt has been doing well and woul d like to come off the cymbalta Dec to 20 mg daily and try to stop after 1-2 months

## 2019-09-10 NOTE — Assessment & Plan Note (Signed)
Poorly controlled will alter medications, encouraged DASH diet, minimize caffeine and obtain adequate sleep. Report concerning symptoms and follow up as directed and as needed 

## 2019-09-12 ENCOUNTER — Telehealth: Payer: Self-pay | Admitting: *Deleted

## 2019-09-12 LAB — JAK 2 V617F (GENPATH)

## 2019-09-12 NOTE — Telephone Encounter (Signed)
-----   Message from Eliezer Bottom, NP sent at 09/11/2019  4:24 PM EDT ----- Work up was negative!!! WOO HOO :) To keep her counts down, she can donate blood with the [employer] 3-4 times a year. Also avoid iron, vitamin B and vitamin C supplements.  She may also benefit from a sleep study for possible undiagnosed sleep apnea which can cause an elevated Hgb. Thank you!    ----- Message ----- From: Interface, Lab In [place] Sent: 09/01/2019   2:08 PM EDT To: Eliezer Bottom, NP

## 2019-09-12 NOTE — Telephone Encounter (Signed)
As noted below by Laverna Peace, NP, I informed the patient that the work up was negative. To keep your counts down, you can donate blood with the [facility] 3-4 times a year. Also, you need to avoid iron, Vitamin B and C supplements. You may benefit from a sleep study for possible undiagnosed sleep apnea which can cause an elevated Hemoglobin. She verbalized understanding.

## 2019-09-14 ENCOUNTER — Inpatient Hospital Stay: Payer: No Typology Code available for payment source

## 2019-09-14 ENCOUNTER — Ambulatory Visit: Payer: No Typology Code available for payment source | Admitting: Family

## 2019-10-04 ENCOUNTER — Ambulatory Visit (INDEPENDENT_AMBULATORY_CARE_PROVIDER_SITE_OTHER): Payer: No Typology Code available for payment source | Admitting: Cardiology

## 2019-10-04 ENCOUNTER — Encounter: Payer: Self-pay | Admitting: Cardiology

## 2019-10-04 ENCOUNTER — Ambulatory Visit (INDEPENDENT_AMBULATORY_CARE_PROVIDER_SITE_OTHER): Payer: No Typology Code available for payment source

## 2019-10-04 ENCOUNTER — Other Ambulatory Visit: Payer: Self-pay

## 2019-10-04 VITALS — BP 118/90 | HR 66 | Ht 68.0 in | Wt 195.0 lb

## 2019-10-04 DIAGNOSIS — R002 Palpitations: Secondary | ICD-10-CM

## 2019-10-04 DIAGNOSIS — R400 Somnolence: Secondary | ICD-10-CM | POA: Insufficient documentation

## 2019-10-04 DIAGNOSIS — I10 Essential (primary) hypertension: Secondary | ICD-10-CM

## 2019-10-04 DIAGNOSIS — E782 Mixed hyperlipidemia: Secondary | ICD-10-CM

## 2019-10-04 DIAGNOSIS — R0602 Shortness of breath: Secondary | ICD-10-CM

## 2019-10-04 HISTORY — DX: Palpitations: R00.2

## 2019-10-04 HISTORY — DX: Somnolence: R40.0

## 2019-10-04 HISTORY — DX: Mixed hyperlipidemia: E78.2

## 2019-10-04 NOTE — Patient Instructions (Signed)
Medication Instructions:  Your physician recommends that you continue on your current medications as directed. Please refer to the Current Medication list given to you today.  *If you need a refill on your cardiac medications before your next appointment, please call your pharmacy*   Lab Work: None If you have labs (blood work) drawn today and your tests are completely normal, you will receive your results only by: Marland Kitchen MyChart Message (if you have MyChart) OR . A paper copy in the mail If you have any lab test that is abnormal or we need to change your treatment, we will call you to review the results.   Testing/Procedures: A zio monitor was ordered today. It will remain on for 14 days. You will then return monitor and event diary in provided box. It takes 1-2 weeks for report to be downloaded and returned to [place]. We will call you with the results. If monitor falls off or has orange flashing light, please call [employer] for further instructions.   We have put in an order for you to have a sleep study. They will call you to schedule this appointment.    Follow-Up: At [facility], you and your health needs are our priority.  As part of our continuing mission to provide you with exceptional heart care, we have created designated Provider Care Teams.  These Care Teams include your primary Cardiologist (physician) and Advanced Practice Providers (APPs -  Physician Assistants and Nurse Practitioners) who all work together to provide you with the care you need, when you need it.  We recommend signing up for the patient portal called "MyChart".  Sign up information is provided on this After Visit Summary.  MyChart is used to connect with patients for Virtual Visits (Telemedicine).  Patients are able to view lab/test results, encounter notes, upcoming appointments, etc.  Non-urgent messages can be sent to your provider as well.   To learn more about what you can do with MyChart, go to  [email or web address].    Your next appointment:   3 month(s)  The format for your next appointment:   In Person  Provider:   Berniece Salines, DO   Other Instructions

## 2019-10-04 NOTE — Progress Notes (Signed)
Cardiology Office Note:    Date:  10/04/2019   ID:  Tina Frost, DOB 07-27-1970, MRN [id number]  PCP:  Ann Held, DO  Cardiologist:  No primary care provider on file.  Electrophysiologist:  None   Referring MD: Carollee Herter, Alferd Apa, *   Chief Complaint  Patient presents with  . Shortness of Breath  . Dizziness    History of Present Illness:    Tina Frost is a 49 y.o. female with a hx of hypertension, hyperlipidemia, presents today to be evaluated for dizziness and shortness of breath.  Patient tells me the last several weeks she has been experiencing intermittent dizziness.  She notes that is usually with some heart fluttering as well as a great deal of fatigue.  Doesn't last long when resolved.  The shortness of breath is an issue for her as she also did get an echocardiogram by her PCP given her shortness of breath which did not show any evidence of any valvular abnormalities her ejection fraction was normal, her diastolic function however was indeterminate. She tells me on exertion she has a shortness of breath but usually is accompanied also with the palpitations. Her biggest problem now that is worsening is fatigue.  She states that she is unable to do anything sometimes during the day given the fact of how fatigued she is.  She goes to bed and never feels as if she has had a refreshing night sleep.  She tells me that sometimes she wakes up she wants to go back and get a nap again.  This is becoming very bothersome.  Past Medical History:  Diagnosis Date  . Abdominal pain   . Anxiety   . Atypical nevus 03/17/2017   right calf  . Depression   . History of kidney stones   . Hyperlipidemia     Past Surgical History:  Procedure Laterality Date  . APPENDECTOMY    . COLONOSCOPY  2002  . HERNIA REPAIR  01/2015  . INSERTION OF MESH N/A 02/11/2015   Procedure: INSERTION OF MESH;  Surgeon: Ralene Ok, MD;  Location: [facility];  Service: General;   Laterality: N/A;  . KIDNEY STONE SURGERY  2008  . TUBAL LIGATION    . UMBILICAL HERNIA REPAIR N/A 02/11/2015   Procedure: LAPAROSCOPIC UMBILICAL HERNIA REPAIR WITH MESH;  Surgeon: Ralene Ok, MD;  Location: [facility];  Service: General;  Laterality: N/A;    Current Medications: Current Meds  Medication Sig  . albuterol (VENTOLIN HFA) 108 (90 Base) MCG/ACT inhaler Inhale 2 puffs into the lungs every 6 (six) hours as needed for shortness of breath.  . ALPRAZolam (XANAX) 0.25 MG tablet Take 1 tablet (0.25 mg total) by mouth 3 (three) times daily as needed. for anxiety  . DULoxetine (CYMBALTA) 20 MG capsule Take 1 capsule (20 mg total) by mouth daily.  Marland Kitchen levonorgestrel (MIRENA, 52 MG,) 20 MCG/24HR IUD Mirena 20 mcg/24 hours (6 yrs) 52 mg intrauterine device  Take 1 device by intrauterine route.  Marland Kitchen lisinopril-hydrochlorothiazide (ZESTORETIC) 20-12.5 MG tablet Take 1 tablet by mouth daily.  . NON FORMULARY Take 0.75 mLs by mouth 2 (two) times daily. CBD Hemp Oil     Allergies:   Amoxicillin and Sulfa antibiotics   Social History   Socioeconomic History  . Marital status: Married    Spouse name: Not on file  . Number of children: 3  . Years of education: Not on file  . Highest education level: Not on file  Occupational History  . Occupation: Owns Engineer, site  Tobacco Use  . Smoking status: Never Smoker  . Smokeless tobacco: Never Used  Substance and Sexual Activity  . Alcohol use: No  . Drug use: No  . Sexual activity: Yes    Partners: Male  Other Topics Concern  . Not on file  Social History Narrative   Gets reg exercise   Social Determinants of Health   Financial Resource Strain:   . Difficulty of Paying Living Expenses:   Food Insecurity:   . Worried About Charity fundraiser in the Last Year:   . Arboriculturist in the Last Year:   Transportation Needs:   . Film/video editor (Medical):   Marland Kitchen Lack of Transportation (Non-Medical):   Physical Activity:   . Days of  Exercise per Week:   . Minutes of Exercise per Session:   Stress:   . Feeling of Stress :   Social Connections:   . Frequency of Communication with Friends and Family:   . Frequency of Social Gatherings with Friends and Family:   . Attends Religious Services:   . Active Member of Clubs or Organizations:   . Attends Archivist Meetings:   Marland Kitchen Marital Status:      Family History: The patient's family history includes Alzheimer's disease in her maternal grandfather; Breast cancer in her mother; Hyperlipidemia in an other family member; Hypertension in an other family member; Kidney disease in an other family member; Lung cancer in her paternal grandmother; Stroke in her father. There is no history of Colon cancer, Rectal cancer, Esophageal cancer, or Liver cancer.  ROS:   Review of Systems  Constitution: Reports fatigue.  Negative for decreased appetite, fever and weight gain.  HENT: Negative for congestion, ear discharge, hoarse voice and sore throat.   Eyes: Negative for discharge, redness, vision loss in right eye and visual halos.  Cardiovascular: Reports palpitations, dyspnea on exertion.  Negative for chest pain, leg swelling, orthopnea.  Respiratory: Negative for cough, hemoptysis, shortness of breath and snoring.   Endocrine: Negative for heat intolerance and polyphagia.  Hematologic/Lymphatic: Negative for bleeding problem. Does not bruise/bleed easily.  Skin: Negative for flushing, nail changes, rash and suspicious lesions.  Musculoskeletal: Negative for arthritis, joint pain, muscle cramps, myalgias, neck pain and stiffness.  Gastrointestinal: Negative for abdominal pain, bowel incontinence, diarrhea and excessive appetite.  Genitourinary: Negative for decreased libido, genital sores and incomplete emptying.  Neurological: Negative for brief paralysis, focal weakness, headaches and loss of balance.  Psychiatric/Behavioral: Negative for altered mental status, depression  and suicidal ideas.  Allergic/Immunologic: Negative for HIV exposure and persistent infections.    EKGs/Labs/Other Studies Reviewed:    The following studies were reviewed today:   EKG:  The ekg ordered today demonstrates sinus rhythm, heart rate 65 bpm  TTE IMPRESSIONS  1. Left ventricular ejection fraction, by estimation, is 55 to 60%. The left ventricle has normal function. The left ventricle has no regional wall motion abnormalities. Left ventricular diastolic parameters are indeterminate. The average left  ventricular global longitudinal strain is -17.8 %. The global longitudinal strain is normal.  2. Right ventricular systolic function is normal. The right ventricular size is normal. There is normal pulmonary artery systolic pressure.  3. Left atrial size was mildly dilated.  4. The mitral valve is normal in structure. Trivial mitral valve regurgitation. No evidence of mitral stenosis.  5. The aortic valve is tricuspid. Aortic valve regurgitation is not visualized. No aortic  stenosis is present.  6. The inferior vena cava is normal in size with greater than 50% respiratory variability, suggesting right atrial pressure of 3 mmHg.   Recent Labs: 07/26/2019: Pro B Natriuretic peptide (BNP) 94.0 08/08/2019: B Natriuretic Peptide 52.0; TSH 0.511 09/01/2019: ALT 20; BUN 14; Creatinine 0.61; Hemoglobin 15.0; Platelet Count 241; Potassium 3.6; Sodium 141  Recent Lipid Panel    Component Value Date/Time   CHOL 187 09/05/2019 1621   TRIG 116.0 09/05/2019 1621   HDL 56.60 09/05/2019 1621   CHOLHDL 3 09/05/2019 1621   VLDL 23.2 09/05/2019 1621   LDLCALC 107 (H) 09/05/2019 1621    Physical Exam:    VS:  BP 118/90 (BP Location: Right Arm, Patient Position: Sitting, Cuff Size: Normal)   Pulse 66   Ht 5\' 8"  (1.727 m)   Wt 195 lb (88.5 kg)   SpO2 98%   BMI 29.65 kg/m     Wt Readings from Last 3 Encounters:  10/04/19 195 lb (88.5 kg)  09/05/19 197 lb (89.4 kg)  09/01/19 196 lb  (88.9 kg)     GEN: Well nourished, well developed in no acute distress HEENT: Normal NECK: No JVD; No carotid bruits LYMPHATICS: No lymphadenopathy CARDIAC: S1S2 noted,RRR, no murmurs, rubs, gallops RESPIRATORY:  Clear to auscultation without rales, wheezing or rhonchi  ABDOMEN: Soft, non-tender, non-distended, +bowel sounds, no guarding. EXTREMITIES: No edema, No cyanosis, no clubbing MUSCULOSKELETAL:  No deformity  SKIN: Warm and dry NEUROLOGIC:  Alert and oriented x 3, non-focal PSYCHIATRIC:  Normal affect, good insight  ASSESSMENT:    1. Essential hypertension   2. Palpitations   3. Daytime somnolence   4. SOB (shortness of breath)   5. Mixed hyperlipidemia    PLAN:     1.  Her blood pressure today in the office is susceptible although she does have some diastolic hypertension.  I have educated the patient to cut back on salt, increase with healthier diet incorporating fruits and vegetable.  I'm hoping that this little change can help with her diastolic pressure bring it less than 80 her target is 130/80 mmHg.  No changes will be made to her antihypertensive medication today.  2.  In terms of her palpitations and dizziness, I would like to rule out a cardiovascular etiology of this palpitation, therefore at this time I would like to placed a zio patch for   14 days. I I reviewed her echocardiogram there is no structural abnormalities that could be suggestive of her symptoms.  3.  With her daytime somnolence I think will be beneficial for the patient to undergo a sleep study.  To rule out OSA. 4.  Hyperlipidemia continue current diet education.  The patient is in agreement with the above plan. The patient left the office in stable condition.  The patient will follow up in 3 months or sooner if needed.   Medication Adjustments/Labs and Tests Ordered: Current medicines are reviewed at length with the patient today.  Concerns regarding medicines are outlined above.  Orders  Placed This Encounter  Procedures  . LONG TERM MONITOR (3-14 DAYS)  . EKG 12-Lead  . Split night study   No orders of the defined types were placed in this encounter.   Patient Instructions  Medication Instructions:  Your physician recommends that you continue on your current medications as directed. Please refer to the Current Medication list given to you today.  *If you need a refill on your cardiac medications before your next appointment, please call  your pharmacy*   Lab Work: None If you have labs (blood work) drawn today and your tests are completely normal, you will receive your results only by: Marland Kitchen MyChart Message (if you have MyChart) OR . A paper copy in the mail If you have any lab test that is abnormal or we need to change your treatment, we will call you to review the results.   Testing/Procedures: A zio monitor was ordered today. It will remain on for 14 days. You will then return monitor and event diary in provided box. It takes 1-2 weeks for report to be downloaded and returned to [place]. We will call you with the results. If monitor falls off or has orange flashing light, please call Zio for further instructions.   We have put in an order for you to have a sleep study. They will call you to schedule this appointment.    Follow-Up: At [facility], you and your health needs are our priority.  As part of our continuing mission to provide you with exceptional heart care, we have created designated Provider Care Teams.  These Care Teams include your primary Cardiologist (physician) and Advanced Practice Providers (APPs -  Physician Assistants and Nurse Practitioners) who all work together to provide you with the care you need, when you need it.  We recommend signing up for the patient portal called "MyChart".  Sign up information is provided on this After Visit Summary.  MyChart is used to connect with patients for Virtual Visits (Telemedicine).  Patients are able to view  lab/test results, encounter notes, upcoming appointments, etc.  Non-urgent messages can be sent to your provider as well.   To learn more about what you can do with MyChart, go to [email or web address].    Your next appointment:   3 month(s)  The format for your next appointment:   In Person  Provider:   Berniece Salines, DO   Other Instructions      Adopting a Healthy Lifestyle.  Know what a healthy weight is for you (roughly BMI <25) and aim to maintain this   Aim for 7+ servings of fruits and vegetables daily   65-80+ fluid ounces of water or unsweet tea for healthy kidneys   Limit to max 1 drink of alcohol per day; avoid smoking/tobacco   Limit animal fats in diet for cholesterol and heart health - choose grass fed whenever available   Avoid highly processed foods, and foods high in saturated/trans fats   Aim for low stress - take time to unwind and care for your mental health   Aim for 150 min of moderate intensity exercise weekly for heart health, and weights twice weekly for bone health   Aim for 7-9 hours of sleep daily   When it comes to diets, agreement about the perfect plan isnt easy to find, even among the experts. Experts at the [employer] developed an idea known as the Healthy Eating Plate. Just imagine a plate divided into logical, healthy portions.   The emphasis is on diet quality:   Load up on vegetables and fruits - one-half of your plate: Aim for color and variety, and remember that potatoes dont count.   Go for whole grains - one-quarter of your plate: Whole wheat, barley, wheat berries, quinoa, oats, brown rice, and foods made with them. If you want pasta, go with whole wheat pasta.   Protein power - one-quarter of your plate: Fish, chicken, beans, and nuts are all healthy,  versatile protein sources. Limit red meat.   The diet, however, does go beyond the plate, offering a few other suggestions.   Use healthy plant oils,  such as olive, canola, soy, corn, sunflower and peanut. Check the labels, and avoid partially hydrogenated oil, which have unhealthy trans fats.   If youre thirsty, drink water. Coffee and tea are good in moderation, but skip sugary drinks and limit milk and dairy products to one or two daily servings.   The type of carbohydrate in the diet is more important than the amount. Some sources of carbohydrates, such as vegetables, fruits, whole grains, and beans-are healthier than others.   Finally, stay active  Signed, Berniece Salines, DO  10/04/2019 12:32 PM    [facility]

## 2019-10-05 ENCOUNTER — Telehealth: Payer: Self-pay | Admitting: *Deleted

## 2019-10-05 NOTE — Telephone Encounter (Signed)
-----   Message from Gita Kudo, RN sent at 10/04/2019 10:17 AM EDT ----- Please schedule patient for a split night sleep study per Dr. Harriet Masson.  Thanks,  Lilia Pro, RN

## 2019-10-06 ENCOUNTER — Telehealth: Payer: Self-pay

## 2019-10-06 ENCOUNTER — Telehealth: Payer: Self-pay | Admitting: *Deleted

## 2019-10-06 NOTE — Telephone Encounter (Signed)
Staff message sent to Resa Miner per Gae Dry with patient's insurance on today no PA is required for sleep study. Ok to schedule. Sleep lab's contact number provided to her.

## 2019-10-06 NOTE — Telephone Encounter (Signed)
Spoke with patient just now and let her know that we got her scheduled for her sleep study on September 20th. I also let her know that she will receive a packet in the mail with all of the details for this appointment. She verbalizes understanding and thanks me for the call back.    Encouraged patient to call back with any questions or concerns.

## 2019-10-06 NOTE — Telephone Encounter (Signed)
-----   Message from Lauralee Evener, [place] sent at 10/06/2019 10:39 AM EDT ----- Ok to schedule sleep study. Per patient's insurance no PA is required. Call [phone] to schedule. ----- Message ----- From: Gita Kudo, RN Sent: 10/04/2019  10:17 AM EDT To: Freada Bergeron, CMA, [facility] Sleep Studies  Please schedule patient for a split night sleep study per Dr. Harriet Masson.  Thanks,  Lilia Pro, RN

## 2019-10-12 ENCOUNTER — Encounter: Payer: Self-pay | Admitting: Hematology & Oncology

## 2019-10-31 ENCOUNTER — Telehealth: Payer: Self-pay

## 2019-10-31 MED ORDER — METOPROLOL SUCCINATE ER 25 MG PO TB24
12.5000 mg | ORAL_TABLET | Freq: Every day | ORAL | 3 refills | Status: DC
Start: 2019-10-31 — End: 2020-09-25

## 2019-10-31 NOTE — Telephone Encounter (Signed)
Spoke with patient regarding results and recommendation.  Patient verbalizes understanding and is agreeable to plan of care. Advised patient to call back with any issues or concerns.  

## 2019-10-31 NOTE — Telephone Encounter (Signed)
-----   Message from Berniece Salines, DO sent at 10/31/2019 10:57 AM EDT ----- Your monitor showed some beats that started the top of the heart and goes up to 169.  Paroxysmal atrial tachycardia.  We can discuss more at your next visit.  If you experiencing a lot more symptoms palpitation I can start you on Toprol-XL 12.5 mg daily.

## 2019-11-13 ENCOUNTER — Encounter (HOSPITAL_BASED_OUTPATIENT_CLINIC_OR_DEPARTMENT_OTHER): Payer: No Typology Code available for payment source | Admitting: Cardiovascular Disease

## 2019-12-06 ENCOUNTER — Other Ambulatory Visit: Payer: Self-pay

## 2019-12-06 ENCOUNTER — Ambulatory Visit (INDEPENDENT_AMBULATORY_CARE_PROVIDER_SITE_OTHER): Payer: Self-pay | Admitting: Cardiology

## 2019-12-06 ENCOUNTER — Encounter: Payer: Self-pay | Admitting: Cardiology

## 2019-12-06 VITALS — BP 132/90 | HR 68 | Ht 68.0 in | Wt 196.0 lb

## 2019-12-06 DIAGNOSIS — R400 Somnolence: Secondary | ICD-10-CM

## 2019-12-06 DIAGNOSIS — R079 Chest pain, unspecified: Secondary | ICD-10-CM

## 2019-12-06 DIAGNOSIS — R002 Palpitations: Secondary | ICD-10-CM

## 2019-12-06 DIAGNOSIS — R0602 Shortness of breath: Secondary | ICD-10-CM

## 2019-12-06 DIAGNOSIS — I10 Essential (primary) hypertension: Secondary | ICD-10-CM

## 2019-12-06 DIAGNOSIS — E782 Mixed hyperlipidemia: Secondary | ICD-10-CM

## 2019-12-06 MED ORDER — NITROGLYCERIN 0.4 MG SL SUBL: 0.4000 mg | SUBLINGUAL_TABLET | SUBLINGUAL | 11 refills | Status: AC | PRN

## 2019-12-06 NOTE — Progress Notes (Signed)
Cardiology Office Note:    Date:  12/06/2019   ID:  Tina Frost, DOB October 20, 1970, MRN [id number]  PCP:  Ann Held, DO  Cardiologist:  No primary care provider on file.  Electrophysiologist:  None   Referring MD: Carollee Herter, Alferd Apa, *   Chief Complaint  Patient presents with  . Follow-up    History of Present Illness:    Tina Frost is a 49 y.o. female with a hx of hypertension, hyperlipidemia initially presented to be evaluated for dizziness and shortness of breath.  During her initial evaluation I had the patient undergo  a ZIO monitor.  She was able to get this testing done previously, after reported to the patient where her ZIO monitor showed evidence of rare supraventricular tachycardia which is likely atrial tachycardia, due to recent palpitations I started her on Toprol-XL 12.5 mg a day.  She had previously had a echocardiogram which was normal. Due to daytime somnolence and fatigue the patient was recommended to undergo sleep study which appears to be still pending.  She is here today for follow-up visit.  She tells me since her visit she has felt the same.  Dizziness is improved but her shortness of breath is still there.  She does note a new symptom of left-sided chest pain under her breasts.  She notes that is been going on for a while off and on.  She ended up wearing a new bra and sizing it to make sure but still has not had any different feeling or any improvement.  She is concerned given the fact that her mother also did have MI in her early 48s.  No other complaints at this time   Past Medical History:  Diagnosis Date  . Abdominal pain   . Anxiety   . Atypical nevus 03/17/2017   right calf  . Depression   . History of kidney stones   . Hyperlipidemia     Past Surgical History:  Procedure Laterality Date  . APPENDECTOMY    . COLONOSCOPY  2002  . HERNIA REPAIR  01/2015  . INSERTION OF MESH N/A 02/11/2015   Procedure: INSERTION OF MESH;   Surgeon: Ralene Ok, MD;  Location: [facility];  Service: General;  Laterality: N/A;  . KIDNEY STONE SURGERY  2008  . TUBAL LIGATION    . UMBILICAL HERNIA REPAIR N/A 02/11/2015   Procedure: LAPAROSCOPIC UMBILICAL HERNIA REPAIR WITH MESH;  Surgeon: Ralene Ok, MD;  Location: [facility];  Service: General;  Laterality: N/A;    Current Medications: Current Meds  Medication Sig  . albuterol (VENTOLIN HFA) 108 (90 Base) MCG/ACT inhaler Inhale 2 puffs into the lungs every 6 (six) hours as needed for shortness of breath.  . ALPRAZolam (XANAX) 0.25 MG tablet Take 1 tablet (0.25 mg total) by mouth 3 (three) times daily as needed. for anxiety  . DULoxetine (CYMBALTA) 20 MG capsule Take 1 capsule (20 mg total) by mouth daily.  Marland Kitchen levonorgestrel (MIRENA, 52 MG,) 20 MCG/24HR IUD Mirena 20 mcg/24 hours (6 yrs) 52 mg intrauterine device  Take 1 device by intrauterine route.  Marland Kitchen lisinopril-hydrochlorothiazide (ZESTORETIC) 20-12.5 MG tablet Take 1 tablet by mouth daily.  . metoprolol succinate (TOPROL XL) 25 MG 24 hr tablet Take 0.5 tablets (12.5 mg total) by mouth daily.     Allergies:   Amoxicillin and Sulfa antibiotics   Social History   Socioeconomic History  . Marital status: Married    Spouse name: Not on file  .  Number of children: 3  . Years of education: Not on file  . Highest education level: Not on file  Occupational History  . Occupation: Owns Engineer, site  Tobacco Use  . Smoking status: Never Smoker  . Smokeless tobacco: Never Used  Substance and Sexual Activity  . Alcohol use: No  . Drug use: No  . Sexual activity: Yes    Partners: Male  Other Topics Concern  . Not on file  Social History Narrative   Gets reg exercise   Social Determinants of Health   Financial Resource Strain:   . Difficulty of Paying Living Expenses: Not on file  Food Insecurity:   . Worried About Charity fundraiser in the Last Year: Not on file  . Ran Out of Food in the Last Year: Not on file    Transportation Needs:   . Lack of Transportation (Medical): Not on file  . Lack of Transportation (Non-Medical): Not on file  Physical Activity:   . Days of Exercise per Week: Not on file  . Minutes of Exercise per Session: Not on file  Stress:   . Feeling of Stress : Not on file  Social Connections:   . Frequency of Communication with Friends and Family: Not on file  . Frequency of Social Gatherings with Friends and Family: Not on file  . Attends Religious Services: Not on file  . Active Member of Clubs or Organizations: Not on file  . Attends Archivist Meetings: Not on file  . Marital Status: Not on file     Family History: The patient's family history includes Alzheimer's disease in her maternal grandfather; Breast cancer in her mother; Hyperlipidemia in an other family member; Hypertension in an other family member; Kidney disease in an other family member; Lung cancer in her paternal grandmother; Stroke in her father. There is no history of Colon cancer, Rectal cancer, Esophageal cancer, or Liver cancer.  ROS:   Review of Systems  Constitution: Negative for decreased appetite, fever and weight gain.  HENT: Negative for congestion, ear discharge, hoarse voice and sore throat.   Eyes: Negative for discharge, redness, vision loss in right eye and visual halos.  Cardiovascular: Reports chest pain and shortness of breath..  Negative for leg swelling, orthopnea and palpitations.  Respiratory: Negative for cough, hemoptysis, shortness of breath and snoring.   Endocrine: Negative for heat intolerance and polyphagia.  Hematologic/Lymphatic: Negative for bleeding problem. Does not bruise/bleed easily.  Skin: Negative for flushing, nail changes, rash and suspicious lesions.  Musculoskeletal: Negative for arthritis, joint pain, muscle cramps, myalgias, neck pain and stiffness.  Gastrointestinal: Negative for abdominal pain, bowel incontinence, diarrhea and excessive appetite.   Genitourinary: Negative for decreased libido, genital sores and incomplete emptying.  Neurological: Negative for brief paralysis, focal weakness, headaches and loss of balance.  Psychiatric/Behavioral: Negative for altered mental status, depression and suicidal ideas.  Allergic/Immunologic: Negative for HIV exposure and persistent infections.    EKGs/Labs/Other Studies Reviewed:    The following studies were reviewed today:   EKG: None today   Recent Labs: 07/26/2019: Pro B Natriuretic peptide (BNP) 94.0 08/08/2019: B Natriuretic Peptide 52.0; TSH 0.511 09/01/2019: ALT 20; BUN 14; Creatinine 0.61; Hemoglobin 15.0; Platelet Count 241; Potassium 3.6; Sodium 141  Recent Lipid Panel    Component Value Date/Time   CHOL 187 09/05/2019 1621   TRIG 116.0 09/05/2019 1621   HDL 56.60 09/05/2019 1621   CHOLHDL 3 09/05/2019 1621   VLDL 23.2 09/05/2019 1621  [place] 107 (H) 09/05/2019 1621    Physical Exam:    VS:  BP 132/90 (BP Location: Right Arm, Patient Position: Sitting, Cuff Size: Normal)   Pulse 68   Ht 5\' 8"  (1.727 m)   Wt 196 lb (88.9 kg)   SpO2 99%   BMI 29.80 kg/m     Wt Readings from Last 3 Encounters:  12/06/19 196 lb (88.9 kg)  10/04/19 195 lb (88.5 kg)  09/05/19 197 lb (89.4 kg)     GEN: Well nourished, well developed in no acute distress HEENT: Normal NECK: No JVD; No carotid bruits LYMPHATICS: No lymphadenopathy CARDIAC: S1S2 noted,RRR, no murmurs, rubs, gallops RESPIRATORY:  Clear to auscultation without rales, wheezing or rhonchi  ABDOMEN: Soft, non-tender, non-distended, +bowel sounds, no guarding. EXTREMITIES: No edema, No cyanosis, no clubbing MUSCULOSKELETAL:  No deformity  SKIN: Warm and dry NEUROLOGIC:  Alert and oriented x 3, non-focal PSYCHIATRIC:  Normal affect, good insight  ASSESSMENT:    1. Chest pain of uncertain etiology   2. Essential hypertension   3. SOB (shortness of breath)   4. Daytime somnolence   5. Mixed hyperlipidemia   6.  Palpitations    PLAN:     Her chest pain symptom now is concerning with her shortness of breath the patient does have intermediate risk for coronary artery disease given her premature family history, hypertension, hyperlipidemia I like to proceed with an ischemic evaluation to make sure that this pain is not secondary to coronary artery disease.  She does not have any IV contrast allergy.  I educated the patient about this testing all of her questions has been answered.  She will remain on the Toprol-XL 12.5 mg for now for the palpitations and paroxysmal atrial tachycardia.  She has some diastolic hypertension in the office today I discussed with the patient lowering salt intake.  We will keep an eye on this and if it continues to elevated we will need to adjust antihypertensive medication.  For now no changes.  She still is pending her sleep study.  The patient is in agreement with the above plan. The patient left the office in stable condition.  The patient will follow up in 3 months or sooner if needed.  Medication Adjustments/Labs and Tests Ordered: Current medicines are reviewed at length with the patient today.  Concerns regarding medicines are outlined above.  Orders Placed This Encounter  Procedures  . CT CORONARY MORPH W/CTA COR W/SCORE W/CA W/CM &/OR WO/CM  . CT CORONARY FRACTIONAL FLOW RESERVE DATA PREP  . CT CORONARY FRACTIONAL FLOW RESERVE FLUID ANALYSIS   Meds ordered this encounter  Medications  . nitroGLYCERIN (NITROSTAT) 0.4 MG SL tablet    Sig: Place 1 tablet (0.4 mg total) under the tongue every 5 (five) minutes as needed.    Dispense:  25 tablet    Refill:  11    There are no Patient Instructions on file for this visit.   Adopting a Healthy Lifestyle.  Know what a healthy weight is for you (roughly BMI <25) and aim to maintain this   Aim for 7+ servings of fruits and vegetables daily   65-80+ fluid ounces of water or unsweet tea for healthy kidneys   Limit  to max 1 drink of alcohol per day; avoid smoking/tobacco   Limit animal fats in diet for cholesterol and heart health - choose grass fed whenever available   Avoid highly processed foods, and foods high in saturated/trans fats   Aim for low  stress - take time to unwind and care for your mental health   Aim for 150 min of moderate intensity exercise weekly for heart health, and weights twice weekly for bone health   Aim for 7-9 hours of sleep daily   When it comes to diets, agreement about the perfect plan isnt easy to find, even among the experts. Experts at the [employer] developed an idea known as the Healthy Eating Plate. Just imagine a plate divided into logical, healthy portions.   The emphasis is on diet quality:   Load up on vegetables and fruits - one-half of your plate: Aim for color and variety, and remember that potatoes dont count.   Go for whole grains - one-quarter of your plate: Whole wheat, barley, wheat berries, quinoa, oats, brown rice, and foods made with them. If you want pasta, go with whole wheat pasta.   Protein power - one-quarter of your plate: Fish, chicken, beans, and nuts are all healthy, versatile protein sources. Limit red meat.   The diet, however, does go beyond the plate, offering a few other suggestions.   Use healthy plant oils, such as olive, canola, soy, corn, sunflower and peanut. Check the labels, and avoid partially hydrogenated oil, which have unhealthy trans fats.   If youre thirsty, drink water. Coffee and tea are good in moderation, but skip sugary drinks and limit milk and dairy products to one or two daily servings.   The type of carbohydrate in the diet is more important than the amount. Some sources of carbohydrates, such as vegetables, fruits, whole grains, and beans-are healthier than others.   Finally, stay active  Signed, Berniece Salines, DO  12/06/2019 1:45 PM    [facility]

## 2019-12-06 NOTE — Patient Instructions (Signed)
Medication Instructions:  Your physician has recommended you make the following change in your medication:   Take as needed for chest pain:   Nitroglycerin 0.4 mg sublingual (under your tongue) as needed for chest pain. If experiencing chest pain, stop what you are doing and sit down. Take 1 nitroglycerin and wait 5 minutes. If chest pain continues, take another nitroglycerin and wait 5 minutes. If chest pain does not subside, take 1 more nitroglycerin and dial 911. You make take a total of 3 nitroglycerin in a 15 minute time frame.  *If you need a refill on your cardiac medications before your next appointment, please call your pharmacy*   Lab Work: None.  If you have labs (blood work) drawn today and your tests are completely normal, you will receive your results only by: Marland Kitchen MyChart Message (if you have MyChart) OR . A paper copy in the mail If you have any lab test that is abnormal or we need to change your treatment, we will call you to review the results.   Testing/Procedures:  Your cardiac CT will be scheduled at one of the below locations:   [facility] [address] [place], [place] [address] [phone]  [facility] [address] [place], [place] [address] [phone]  If scheduled at [facility], please arrive at the [facility] main entrance of [facility] 30 minutes prior to test start time. Proceed to the [facility] Radiology Department (first floor) to check-in and test prep.  If scheduled at [facility], please arrive 15 mins early for check-in and test prep.  Please follow these instructions carefully (unless otherwise directed):    On the Night Before the Test: . Be sure to Drink plenty of water. . Do not consume any caffeinated/decaffeinated beverages or chocolate 12 hours prior to your test. . Do not take any antihistamines 12 hours prior to  your test.  On the Day of the Test: . Drink plenty of water. Do not drink any water within one hour of the test. . Do not eat any food 4 hours prior to the test. . You may take your regular medications prior to the test.  . Take metoprolol (Lopressor) two hours prior to test. . FEMALES- please wear underwire-free bra if available          After the Test: . Drink plenty of water. . After receiving IV contrast, you may experience a mild flushed feeling. This is normal. . On occasion, you may experience a mild rash up to 24 hours after the test. This is not dangerous. If this occurs, you can take Benadryl 25 mg and increase your fluid intake. . If you experience trouble breathing, this can be serious. If it is severe call 911 IMMEDIATELY. If it is mild, please call our office. . If you take any of these medications: Glipizide/Metformin, Avandament, Glucavance, please do not take 48 hours after completing test unless otherwise instructed.   Once we have confirmed authorization from your insurance company, we will call you to set up a date and time for your test. Based on how quickly your insurance processes prior authorizations requests, please allow up to 4 weeks to be contacted for scheduling your Cardiac CT appointment. Be advised that routine Cardiac CT appointments could be scheduled as many as 8 weeks after your provider has ordered it.  For non-scheduling related questions, please contact the cardiac imaging nurse navigator should  you have any questions/concerns: Marchia Bond, Cardiac Imaging Nurse Navigator Burley Saver, Interim Cardiac Imaging Nurse Navigator [facility] Direct Office Dial: [phone]   For scheduling needs, including cancellations and rescheduling, please call Vivien Rota at [phone], option [phone].        Follow-Up: At [facility], you and your health needs are our priority.  As part of our continuing mission to provide you with  exceptional heart care, we have created designated Provider Care Teams.  These Care Teams include your primary Cardiologist (physician) and Advanced Practice Providers (APPs -  Physician Assistants and Nurse Practitioners) who all work together to provide you with the care you need, when you need it.  We recommend signing up for the patient portal called "MyChart".  Sign up information is provided on this After Visit Summary.  MyChart is used to connect with patients for Virtual Visits (Telemedicine).  Patients are able to view lab/test results, encounter notes, upcoming appointments, etc.  Non-urgent messages can be sent to your provider as well.   To learn more about what you can do with MyChart, go to [email or web address].    Your next appointment:   3 month(s)  The format for your next appointment:   In Person  Provider:   Berniece Salines, DO   Other Instructions  Nitroglycerin sublingual tablets What is this medicine? NITROGLYCERIN (nye troe GLI ser in) is a type of vasodilator. It relaxes blood vessels, increasing the blood and oxygen supply to your heart. This medicine is used to relieve chest pain caused by angina. It is also used to prevent chest pain before activities like climbing stairs, going outdoors in cold weather, or sexual activity. This medicine may be used for other purposes; ask your health care provider or pharmacist if you have questions. COMMON BRAND NAME(S): Nitroquick, Nitrostat, Nitrotab What should I tell my health care provider before I take this medicine? They need to know if you have any of these conditions:  anemia  head injury, recent stroke, or bleeding in the brain  liver disease  previous heart attack  an unusual or allergic reaction to nitroglycerin, other medicines, foods, dyes, or preservatives  pregnant or trying to get pregnant  breast-feeding How should I use this medicine? Take this medicine by mouth as needed. At the first sign of an  angina attack (chest pain or tightness) place one tablet under your tongue. You can also take this medicine 5 to 10 minutes before an event likely to produce chest pain. Follow the directions on the prescription label. Let the tablet dissolve under the tongue. Do not swallow whole. Replace the dose if you accidentally swallow it. It will help if your mouth is not dry. Saliva around the tablet will help it to dissolve more quickly. Do not eat or drink, smoke or chew tobacco while a tablet is dissolving. If you are not better within 5 minutes after taking ONE dose of nitroglycerin, call 9-1-1 immediately to seek emergency medical care. Do not take more than 3 nitroglycerin tablets over 15 minutes. If you take this medicine often to relieve symptoms of angina, your doctor or health care professional may provide you with different instructions to manage your symptoms. If symptoms do not go away after following these instructions, it is important to call 9-1-1 immediately. Do not take more than 3 nitroglycerin tablets over 15 minutes. Talk to your pediatrician regarding the use of this medicine in children. Special care may be needed. Overdosage: If you think  you have taken too much of this medicine contact a poison control center or emergency room at once. NOTE: This medicine is only for you. Do not share this medicine with others. What if I miss a dose? This does not apply. This medicine is only used as needed. What may interact with this medicine? Do not take this medicine with any of the following medications:  certain migraine medicines like ergotamine and dihydroergotamine (DHE)  medicines used to treat erectile dysfunction like sildenafil, tadalafil, and vardenafil  riociguat This medicine may also interact with the following medications:  alteplase  aspirin  heparin  medicines for high blood pressure  medicines for mental depression  other medicines used to treat  angina  phenothiazines like chlorpromazine, mesoridazine, prochlorperazine, thioridazine This list may not describe all possible interactions. Give your health care provider a list of all the medicines, herbs, non-prescription drugs, or dietary supplements you use. Also tell them if you smoke, drink alcohol, or use illegal drugs. Some items may interact with your medicine. What should I watch for while using this medicine? Tell your doctor or health care professional if you feel your medicine is no longer working. Keep this medicine with you at all times. Sit or lie down when you take your medicine to prevent falling if you feel dizzy or faint after using it. Try to remain calm. This will help you to feel better faster. If you feel dizzy, take several deep breaths and lie down with your feet propped up, or bend forward with your head resting between your knees. You may get drowsy or dizzy. Do not drive, use machinery, or do anything that needs mental alertness until you know how this drug affects you. Do not stand or sit up quickly, especially if you are an older patient. This reduces the risk of dizzy or fainting spells. Alcohol can make you more drowsy and dizzy. Avoid alcoholic drinks. Do not treat yourself for coughs, colds, or pain while you are taking this medicine without asking your doctor or health care professional for advice. Some ingredients may increase your blood pressure. What side effects may I notice from receiving this medicine? Side effects that you should report to your doctor or health care professional as soon as possible:  blurred vision  dry mouth  skin rash  sweating  the feeling of extreme pressure in the head  unusually weak or tired Side effects that usually do not require medical attention (report to your doctor or health care professional if they continue or are bothersome):  flushing of the face or neck  headache  irregular heartbeat,  palpitations  nausea, vomiting This list may not describe all possible side effects. Call your doctor for medical advice about side effects. You may report side effects to FDA at 1-800-FDA-1088. Where should I keep my medicine? Keep out of the reach of children. Store at room temperature between 20 and 25 degrees C (68 and 77 degrees F). Store in Chief of Staff. Protect from light and moisture. Keep tightly closed. Throw away any unused medicine after the expiration date. NOTE: This sheet is a summary. It may not cover all possible information. If you have questions about this medicine, talk to your doctor, pharmacist, or health care provider.  2020 Elsevier/Gold Standard (2012-12-08 17:57:36)

## 2019-12-11 ENCOUNTER — Other Ambulatory Visit: Payer: Self-pay | Admitting: Family Medicine

## 2019-12-11 DIAGNOSIS — F320 Major depressive disorder, single episode, mild: Secondary | ICD-10-CM

## 2019-12-19 ENCOUNTER — Telehealth (HOSPITAL_COMMUNITY): Payer: Self-pay | Admitting: *Deleted

## 2019-12-19 NOTE — Telephone Encounter (Signed)

## 2019-12-20 ENCOUNTER — Ambulatory Visit (HOSPITAL_COMMUNITY)
Admission: RE | Admit: 2019-12-20 | Discharge: 2019-12-20 | Disposition: A | Discharge status: Home / Self Care | Payer: Self-pay | Source: Ambulatory Visit | Attending: Cardiology | Admitting: Cardiology

## 2019-12-20 ENCOUNTER — Encounter: Payer: Self-pay | Admitting: *Deleted

## 2019-12-20 ENCOUNTER — Other Ambulatory Visit: Payer: Self-pay

## 2019-12-20 DIAGNOSIS — R079 Chest pain, unspecified: Secondary | ICD-10-CM | POA: Insufficient documentation

## 2019-12-20 DIAGNOSIS — Z006 Encounter for examination for normal comparison and control in clinical research program: Secondary | ICD-10-CM

## 2019-12-20 MED ORDER — METOPROLOL TARTRATE 5 MG/5ML IV SOLN
INTRAVENOUS | Status: AC
  Filled 2019-12-20: qty 5

## 2019-12-20 MED ORDER — NITROGLYCERIN 0.4 MG SL SUBL
SUBLINGUAL_TABLET | SUBLINGUAL | Status: AC
  Filled 2019-12-20: qty 2

## 2019-12-20 MED ORDER — NITROGLYCERIN 0.4 MG SL SUBL
0.8000 mg | SUBLINGUAL_TABLET | Freq: Once | SUBLINGUAL | Status: AC
  Administered 2019-12-20: 0.8 mg via SUBLINGUAL

## 2019-12-20 MED ORDER — METOPROLOL TARTRATE 5 MG/5ML IV SOLN
5.0000 mg | INTRAVENOUS | Status: DC | PRN
  Administered 2019-12-20: 5 mg via INTRAVENOUS

## 2019-12-20 MED ORDER — IOHEXOL 350 MG/ML SOLN
80.0000 mL | Freq: Once | INTRAVENOUS | Status: AC | PRN
  Administered 2019-12-20: 80 mL via INTRAVENOUS

## 2019-12-20 NOTE — Research (Signed)
CADFEM Informed Consent                  Subject Name:   Tina Frost   Subject met inclusion and exclusion criteria.  The informed consent form, study requirements and expectations were reviewed with the subject and questions and concerns were addressed prior to the signing of the consent form.  The subject verbalized understanding of the trial requirements.  The subject agreed to participate in the CADFEM trial and signed the informed consent.  The informed consent was obtained prior to performance of any protocol-specific procedures for the subject.  A copy of the signed informed consent was given to the subject and a copy was placed in the subject's medical record.   [place] Demetres Prochnow, Research Assistant  12/20/2019 07:58 a.m.

## 2019-12-21 ENCOUNTER — Telehealth: Payer: Self-pay

## 2019-12-21 NOTE — Telephone Encounter (Signed)
Spoke with patient regarding results and recommendation.  Patient verbalizes understanding and is agreeable to plan of care. Advised patient to call back with any issues or concerns.  

## 2019-12-21 NOTE — Telephone Encounter (Signed)
-----   Message from Berniece Salines, DO sent at 12/20/2019 11:02 PM EDT ----- Doristine Devoid news, calcium score is 0. No CAD.

## 2019-12-21 NOTE — Telephone Encounter (Signed)
Left message on patients voicemail to please return our call.   

## 2019-12-21 NOTE — Telephone Encounter (Signed)
Patient returning call.

## 2019-12-25 ENCOUNTER — Ambulatory Visit: Payer: No Typology Code available for payment source | Admitting: Cardiology

## 2020-02-12 ENCOUNTER — Other Ambulatory Visit: Payer: Self-pay | Admitting: Family Medicine

## 2020-02-12 ENCOUNTER — Telehealth: Payer: Self-pay | Admitting: Family Medicine

## 2020-02-12 DIAGNOSIS — F320 Major depressive disorder, single episode, mild: Secondary | ICD-10-CM

## 2020-02-12 MED ORDER — DULOXETINE HCL 20 MG PO CPEP: 20.0000 mg | ORAL_CAPSULE | Freq: Every day | ORAL | 0 refills | Status: DC

## 2020-02-12 NOTE — Telephone Encounter (Signed)
Medication: DULoxetine (CYMBALTA) 20 MG capsule [[id number]   Has the patient contacted their pharmacy? No. (If no, request that the patient contact the pharmacy for the refill.) (If yes, when and what did the pharmacy advise?)  Preferred Pharmacy (with phone number or street name): [place], [place] - [address]  [address] [place], [place] [address]  Phone:  [phone] Fax:  [phone]  DEA #:  --  Agent: Please be advised that RX refills may take up to 3 business days. We ask that you follow-up with your pharmacy.

## 2020-02-12 NOTE — Telephone Encounter (Signed)
Refill sent.

## 2020-03-07 ENCOUNTER — Encounter: Payer: Self-pay | Admitting: Family Medicine

## 2020-03-07 ENCOUNTER — Other Ambulatory Visit: Payer: Self-pay

## 2020-03-07 ENCOUNTER — Ambulatory Visit (INDEPENDENT_AMBULATORY_CARE_PROVIDER_SITE_OTHER): Payer: 59 | Admitting: Family Medicine

## 2020-03-07 VITALS — BP 130/88 | HR 72 | Temp 98.3°F | Resp 18 | Ht 68.0 in | Wt 198.6 lb

## 2020-03-07 DIAGNOSIS — F419 Anxiety disorder, unspecified: Secondary | ICD-10-CM | POA: Diagnosis not present

## 2020-03-07 DIAGNOSIS — I10 Essential (primary) hypertension: Secondary | ICD-10-CM | POA: Diagnosis not present

## 2020-03-07 DIAGNOSIS — E785 Hyperlipidemia, unspecified: Secondary | ICD-10-CM

## 2020-03-07 DIAGNOSIS — R5383 Other fatigue: Secondary | ICD-10-CM

## 2020-03-07 MED ORDER — DULOXETINE HCL 30 MG PO CPEP: 30.0000 mg | ORAL_CAPSULE | Freq: Two times a day (BID) | ORAL | 3 refills | Status: DC

## 2020-03-07 NOTE — Progress Notes (Signed)
Patient ID: Tina Frost, female    DOB: February 27, 1970  Age: 50 y.o. MRN: [id number]    Subjective:  Subjective  HPI Tina Frost presents for f/u cymbalta--- she would like to inc the dose  She also needs f/u bp and cholesterol   Review of Systems  Constitutional: Negative for activity change, appetite change, chills, fatigue, fever and unexpected weight change.  Respiratory: Negative for cough and shortness of breath.   Cardiovascular: Negative for chest pain and palpitations.  Gastrointestinal: Negative for abdominal distention and abdominal pain.  Genitourinary: Negative for difficulty urinating, dyspareunia, dysuria, flank pain, frequency, genital sores, hematuria, menstrual problem, pelvic pain, urgency, vaginal discharge and vaginal pain.  Musculoskeletal: Negative for back pain.  Psychiatric/Behavioral: Negative for behavioral problems, dysphoric mood, self-injury, sleep disturbance and suicidal ideas. The patient is nervous/anxious.     History Past Medical History:  Diagnosis Date  . Abdominal pain   . Anxiety   . Atypical nevus 03/17/2017   right calf  . Depression   . History of kidney stones   . Hyperlipidemia     She has a past surgical history that includes Colonoscopy (2002); Appendectomy; Tubal ligation; Kidney stone surgery (2008); Umbilical hernia repair (N/A, 02/11/2015); Insertion of mesh (N/A, 02/11/2015); and Hernia repair (01/2015).   Her family history includes Alzheimer's disease in her maternal grandfather; Breast cancer in her mother; Hyperlipidemia in an other family member; Hypertension in an other family member; Kidney disease in an other family member; Lung cancer in her paternal grandmother; Stroke in her father.She reports that she has never smoked. She has never used smokeless tobacco. She reports that she does not drink alcohol and does not use drugs.  Current Outpatient Medications on File Prior to Visit  Medication Sig Dispense Refill  .  ALPRAZolam (XANAX) 0.25 MG tablet Take 1 tablet (0.25 mg total) by mouth 3 (three) times daily as needed. for anxiety 30 tablet 0  . levonorgestrel (MIRENA, 52 MG,) 20 MCG/24HR IUD Mirena 20 mcg/24 hours (6 yrs) 52 mg intrauterine device  Take 1 device by intrauterine route.    Marland Kitchen lisinopril-hydrochlorothiazide (ZESTORETIC) 20-12.5 MG tablet Take 1 tablet by mouth daily. 90 tablet 3  . metoprolol succinate (TOPROL XL) 25 MG 24 hr tablet Take 0.5 tablets (12.5 mg total) by mouth daily. 45 tablet 3  . albuterol (VENTOLIN HFA) 108 (90 Base) MCG/ACT inhaler Inhale 2 puffs into the lungs every 6 (six) hours as needed for shortness of breath. (Patient not taking: No sig reported) 18 g 0  . nitroGLYCERIN (NITROSTAT) 0.4 MG SL tablet Place 1 tablet (0.4 mg total) under the tongue every 5 (five) minutes as needed. 25 tablet 11   No current facility-administered medications on file prior to visit.     Objective:  Objective  Physical Exam Vitals and nursing note reviewed.  Constitutional:      Appearance: She is well-developed and well-nourished.  HENT:     Head: Normocephalic and atraumatic.  Eyes:     Extraocular Movements: EOM normal.     Conjunctiva/sclera: Conjunctivae normal.  Neck:     Thyroid: No thyromegaly.     Vascular: No carotid bruit or JVD.  Cardiovascular:     Rate and Rhythm: Normal rate and regular rhythm.     Heart sounds: Normal heart sounds. No murmur heard.   Pulmonary:     Effort: Pulmonary effort is normal. No respiratory distress.     Breath sounds: Normal breath sounds. No wheezing or rales.  Chest:     Chest wall: No tenderness.  Musculoskeletal:        General: No edema.     Cervical back: Normal range of motion and neck supple.  Neurological:     Mental Status: She is alert and oriented to person, place, and time.  Psychiatric:        Mood and Affect: Mood and affect normal.    BP 130/88 (BP Location: Right Arm, Patient Position: Sitting, Cuff Size:  Normal)   Pulse 72   Temp 98.3 F (36.8 C) (Oral)   Resp 18   Ht 5\' 8"  (1.727 m)   Wt 198 lb 9.6 oz (90.1 kg)   SpO2 98%   BMI 30.20 kg/m  Wt Readings from Last 3 Encounters:  03/07/20 198 lb 9.6 oz (90.1 kg)  12/06/19 196 lb (88.9 kg)  10/04/19 195 lb (88.5 kg)     Lab Results  Component Value Date   WBC 6.5 09/01/2019   HGB 15.0 09/01/2019   HCT 42.2 09/01/2019   PLT 241 09/01/2019   GLUCOSE 104 (H) 09/01/2019   CHOL 187 09/05/2019   TRIG 116.0 09/05/2019   HDL 56.60 09/05/2019   LDLCALC 107 (H) 09/05/2019   ALT 20 09/01/2019   AST 15 09/01/2019   NA 141 09/01/2019   K 3.6 09/01/2019   CL 107 09/01/2019   CREATININE 0.61 09/01/2019   BUN 14 09/01/2019   CO2 27 09/01/2019   TSH 0.511 08/08/2019    CT CORONARY MORPH W/CTA COR W/SCORE W/CA W/CM &/OR WO/CM  Addendum Date: 12/20/2019   ADDENDUM REPORT: 12/20/2019 19:40 CLINICAL DATA:  50 year old female with chest pain. EXAM: Cardiac/Coronary  CT TECHNIQUE: The patient was scanned on a [employer]. FINDINGS: A 120 kV prospective scan was triggered in the descending thoracic aorta at 111 HU's. Axial non-contrast 3 mm slices were carried out through the heart. The data set was analyzed on a dedicated work station and scored using the [place]. Gantry rotation speed was 250 msecs and collimation was .6 mm. No beta blockade and 0.8 mg of sl NTG was given. The 3D data set was reconstructed in 5% intervals of the 67-82 % of the R-R cycle. Diastolic phases were analyzed on a dedicated work station using MPR, MIP and VRT modes. The patient received 80 cc of contrast. Aorta: Normal size.  No calcifications.  No dissection. Aortic Valve:  Trileaflet.  No calcifications. Coronary Arteries:  Normal coronary origin.  Right dominance. RCA is a large dominant artery that gives rise to PDA and PLVB. There is no plaque. Left main is a large artery that gives rise to LAD and LCX arteries. LAD is a large vessel that has no  plaque. LCX is a non-dominant artery that gives rise to one large OM1 branch. There is no plaque. Other findings: Normal pulmonary vein drainage into the left atrium. Normal left atrial appendage without a thrombus. Normal size of the pulmonary artery. IMPRESSION: 1. Coronary calcium score of 0. This was 0 percentile for age and sex matched control. 2. Normal coronary origin with right dominance. 3. No evidence of CAD. Berniece Salines, DO Electronically Signed   By: Berniece Salines DO   On: 12/20/2019 19:40   Result Date: 12/20/2019 EXAM: OVER-READ INTERPRETATION  CT CHEST The following report is an over-read performed by radiologist Dr. Vinnie Langton of [facility], [place] on 12/20/2019. This over-read does not include interpretation of cardiac or coronary anatomy or pathology. The  coronary calcium score/coronary CTA interpretation by the cardiologist is attached. COMPARISON:  None. FINDINGS: Within the visualized portions of the thorax there are no suspicious appearing pulmonary nodules or masses, there is no acute consolidative airspace disease, no pleural effusions, no pneumothorax and no lymphadenopathy. Visualized portions of the upper abdomen are unremarkable. There are no aggressive appearing lytic or blastic lesions noted in the visualized portions of the skeleton. IMPRESSION: No significant incidental noncardiac findings are noted. Electronically Signed: By: Vinnie Langton M.D. On: 12/20/2019 09:10     Assessment & Plan:  Plan  I have discontinued [name]. Murtha's DULoxetine. I am also having her start on DULoxetine. Additionally, I am having her maintain her ALPRAZolam, albuterol, Mirena (52 MG), lisinopril-hydrochlorothiazide, metoprolol succinate, and nitroGLYCERIN.  Meds ordered this encounter  Medications  . DULoxetine (CYMBALTA) 30 MG capsule    Sig: Take 1 capsule (30 mg total) by mouth 2 (two) times daily.    Dispense:  60 capsule    Refill:  3    Problem List Items Addressed  This Visit      Unprioritized   Other fatigue   Relevant Orders   CBC with Differential/Platelet   Vitamin B12   TSH    Other Visit Diagnoses    Anxiety    -  Primary   Relevant Medications   DULoxetine (CYMBALTA) 30 MG capsule   Other Relevant Orders   CBC with Differential/Platelet   Vitamin B12   TSH   Hyperlipidemia, unspecified hyperlipidemia type       Relevant Orders   Lipid panel   Comprehensive metabolic panel      Follow-up: No follow-ups on file.  Ann Held, DO

## 2020-03-07 NOTE — Patient Instructions (Signed)
[email or web address]">  Generalized Anxiety Disorder, Adult Generalized anxiety disorder (GAD) is a mental health condition. Unlike normal worries, anxiety related to GAD is not triggered by a specific event. These worries do not fade or get better with time. GAD interferes with relationships, work, and school. GAD symptoms can vary from mild to severe. People with severe GAD can have intense waves of anxiety with physical symptoms that are similar to panic attacks. What are the causes? The exact cause of GAD is not known, but the following are believed to have an impact:  Differences in natural brain chemicals.  Genes passed down from parents to children.  Differences in the way threats are perceived.  Development during childhood.  Personality. What increases the risk? The following factors may make you more likely to develop this condition:  Being female.  Having a family history of anxiety disorders.  Being very shy.  Experiencing very stressful life events, such as the death of a loved one.  Having a very stressful family environment. What are the signs or symptoms? People with GAD often worry excessively about many things in their lives, such as their health and family. Symptoms may also include:  Mental and emotional symptoms: ? Worrying excessively about natural disasters. ? Fear of being late. ? Difficulty concentrating. ? Fears that others are judging your performance.  Physical symptoms: ? Fatigue. ? Headaches, muscle tension, muscle twitches, trembling, or feeling shaky. ? Feeling like your heart is pounding or beating very fast. ? Feeling out of breath or like you cannot take a deep breath. ? Having trouble falling asleep or staying asleep, or experiencing restlessness. ? Sweating. ? Nausea, diarrhea, or irritable bowel syndrome (IBS).  Behavioral symptoms: ? Experiencing erratic moods or irritability. ? Avoidance of new situations. ? Avoidance of  people. ? Extreme difficulty making decisions. How is this diagnosed? This condition is diagnosed based on your symptoms and medical history. You will also have a physical exam. Your health care provider may perform tests to rule out other possible causes of your symptoms. To be diagnosed with GAD, a person must have anxiety that:  Is out of his or her control.  Affects several different aspects of his or her life, such as work and relationships.  Causes distress that makes him or her unable to take part in normal activities.  Includes at least three symptoms of GAD, such as restlessness, fatigue, trouble concentrating, irritability, muscle tension, or sleep problems. Before your health care provider can confirm a diagnosis of GAD, these symptoms must be present more days than they are not, and they must last for 6 months or longer. How is this treated? This condition may be treated with:  Medicine. Antidepressant medicine is usually prescribed for long-term daily control. Anti-anxiety medicines may be added in severe cases, especially when panic attacks occur.  Talk therapy (psychotherapy). Certain types of talk therapy can be helpful in treating GAD by providing support, education, and guidance. Options include: ? Cognitive behavioral therapy (CBT). People learn coping skills and self-calming techniques to ease their physical symptoms. They learn to identify unrealistic thoughts and behaviors and to replace them with more appropriate thoughts and behaviors. ? Acceptance and commitment therapy (ACT). This treatment teaches people how to be mindful as a way to cope with unwanted thoughts and feelings. ? Biofeedback. This process trains you to manage your body's response (physiological response) through breathing techniques and relaxation methods. You will work with a therapist while machines are used to monitor your physical   symptoms.  Stress management techniques. These include yoga,  meditation, and exercise. A mental health specialist can help determine which treatment is best for you. Some people see improvement with one type of therapy. However, other people require a combination of therapies.   Follow these instructions at home: Lifestyle  Maintain a consistent routine and schedule.  Anticipate stressful situations. Create a plan, and allow extra time to work with your plan.  Practice stress management or self-calming techniques that you have learned from your therapist or your health care provider. General instructions  Take over-the-counter and prescription medicines only as told by your health care provider.  Understand that you are likely to have setbacks. Accept this and be kind to yourself as you persist to take better care of yourself.  Recognize and accept your accomplishments, even if you judge them as small.  Keep all follow-up visits as told by your health care provider. This is important. Contact a health care provider if:  Your symptoms do not get better.  Your symptoms get worse.  You have signs of depression, such as: ? A persistently sad or irritable mood. ? Loss of enjoyment in activities that used to bring you joy. ? Change in weight or eating. ? Changes in sleeping habits. ? Avoiding friends or family members. ? Loss of energy for normal tasks. ? Feelings of guilt or worthlessness. Get help right away if:  You have serious thoughts about hurting yourself or others. If you ever feel like you may hurt yourself or others, or have thoughts about taking your own life, get help right away. Go to your nearest emergency department or:  Call your local emergency services (911 in the [place]  Call a suicide crisis helpline, such as the National Suicide Prevention Lifeline at 1-800-273-8255. This is open 24 hours a day in the [place]  Text the Crisis Text Line at 741741 (in the [place] Summary  Generalized anxiety disorder (GAD) is a mental  health condition that involves worry that is not triggered by a specific event.  People with GAD often worry excessively about many things in their lives, such as their health and family.  GAD may cause symptoms such as restlessness, trouble concentrating, sleep problems, frequent sweating, nausea, diarrhea, headaches, and trembling or muscle twitching.  A mental health specialist can help determine which treatment is best for you. Some people see improvement with one type of therapy. However, other people require a combination of therapies. This information is not intended to replace advice given to you by your health care provider. Make sure you discuss any questions you have with your health care provider. Document Revised: 11/30/2018 Document Reviewed: 11/30/2018 Elsevier Patient Education  2021 Elsevier Inc.  

## 2020-03-08 LAB — COMPREHENSIVE METABOLIC PANEL
ALT: 26 U/L (ref 0–35)
AST: 20 U/L (ref 0–37)
Albumin: 4.3 g/dL (ref 3.5–5.2)
Alkaline Phosphatase: 73 U/L (ref 39–117)
BUN: 8 mg/dL (ref 6–23)
CO2: 28 mEq/L (ref 19–32)
Calcium: 9.6 mg/dL (ref 8.4–10.5)
Chloride: 104 mEq/L (ref 96–112)
Creatinine, Ser: 0.68 mg/dL (ref 0.40–1.20)
GFR: 103.41 mL/min (ref >60.00)
Glucose, Bld: 72 mg/dL (ref 70–99)
Potassium: 4 mEq/L (ref 3.5–5.1)
Sodium: 141 mEq/L (ref 135–145)
Total Bilirubin: 0.8 mg/dL (ref 0.2–1.2)
Total Protein: 6.5 g/dL (ref 6.0–8.3)

## 2020-03-08 LAB — CBC WITH DIFFERENTIAL/PLATELET
Basophils Absolute: 0.1 10*3/uL (ref 0.0–0.1)
Basophils Relative: 1.5 % (ref 0.0–3.0)
Eosinophils Absolute: 0.1 10*3/uL (ref 0.0–0.7)
Eosinophils Relative: 2.1 % (ref 0.0–5.0)
HCT: 43.8 % (ref 36.0–46.0)
Hemoglobin: 14.8 g/dL (ref 12.0–15.0)
Lymphocytes Relative: 25.8 % (ref 12.0–46.0)
Lymphs Abs: 1.8 10*3/uL (ref 0.7–4.0)
MCHC: 33.8 g/dL (ref 30.0–36.0)
MCV: 93.1 fl (ref 78.0–100.0)
Monocytes Absolute: 0.5 10*3/uL (ref 0.1–1.0)
Monocytes Relative: 6.5 % (ref 3.0–12.0)
Neutro Abs: 4.6 10*3/uL (ref 1.4–7.7)
Neutrophils Relative %: 64.1 % (ref 43.0–77.0)
Platelets: 218 10*3/uL (ref 150.0–400.0)
RBC: 4.7 Mil/uL (ref 3.87–5.11)
RDW: 13.5 % (ref 11.5–15.5)
WBC: 7.2 10*3/uL (ref 4.0–10.5)

## 2020-03-08 LAB — VITAMIN B12: Vitamin B-12: 588 pg/mL (ref 211–911)

## 2020-03-08 LAB — LIPID PANEL
Cholesterol: 212 mg/dL — ABNORMAL HIGH (ref 0–200)
HDL: 58.8 mg/dL (ref >39.00)
LDL Cholesterol: 133 mg/dL — ABNORMAL HIGH (ref 0–99)
NonHDL: 153.64
Total CHOL/HDL Ratio: 4
Triglycerides: 101 mg/dL (ref 0.0–149.0)
VLDL: 20.2 mg/dL (ref 0.0–40.0)

## 2020-03-08 LAB — TSH: TSH: 0.64 u[IU]/mL (ref 0.35–4.50)

## 2020-03-08 IMAGING — DX DG ANKLE COMPLETE 3+V*R*
3 series · 3 of 3 positions shown · non-contrast
Comparison: None.

CLINICAL DATA: Worsening right ankle swelling for several months.

EXAM:
RIGHT ANKLE - COMPLETE 3+ VIEW

[ankle ap]
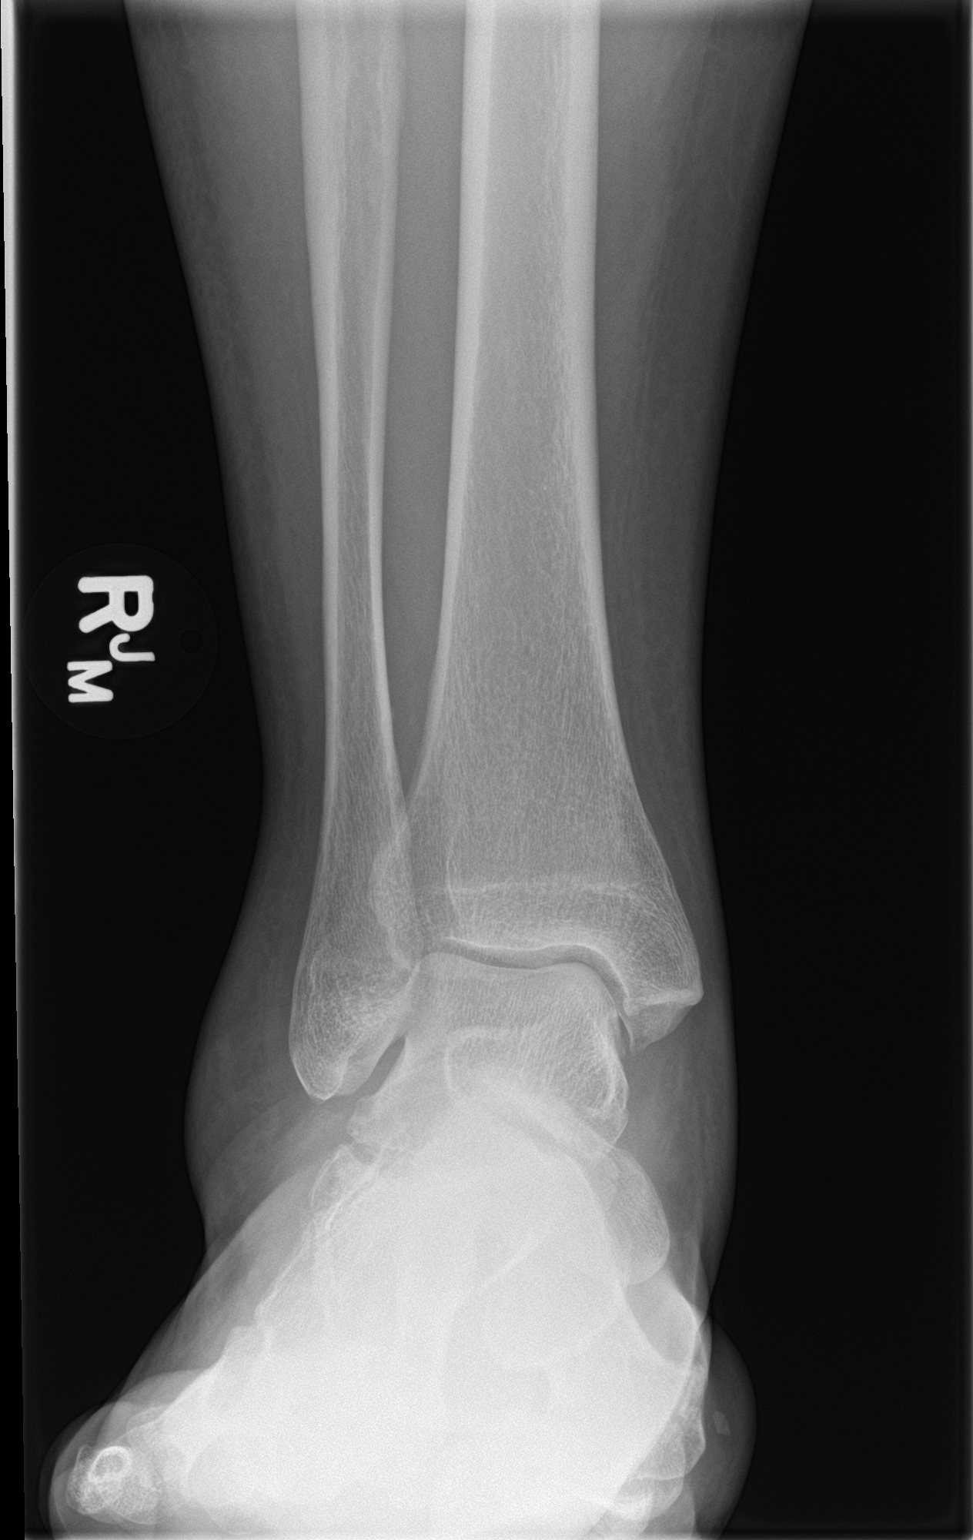

[ankle obl]
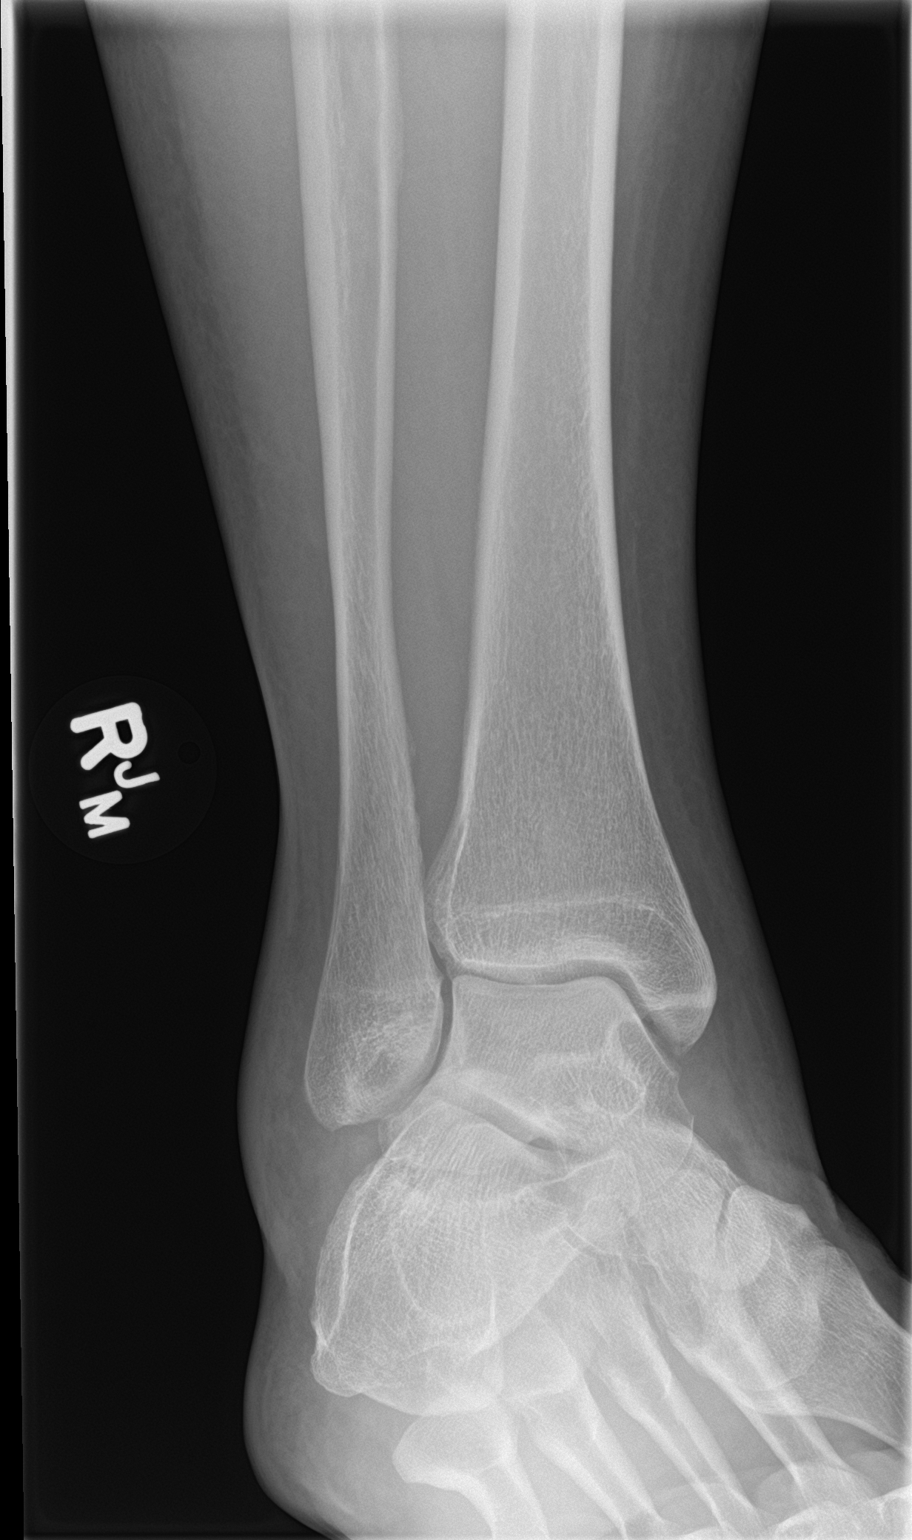

[ankle lat]
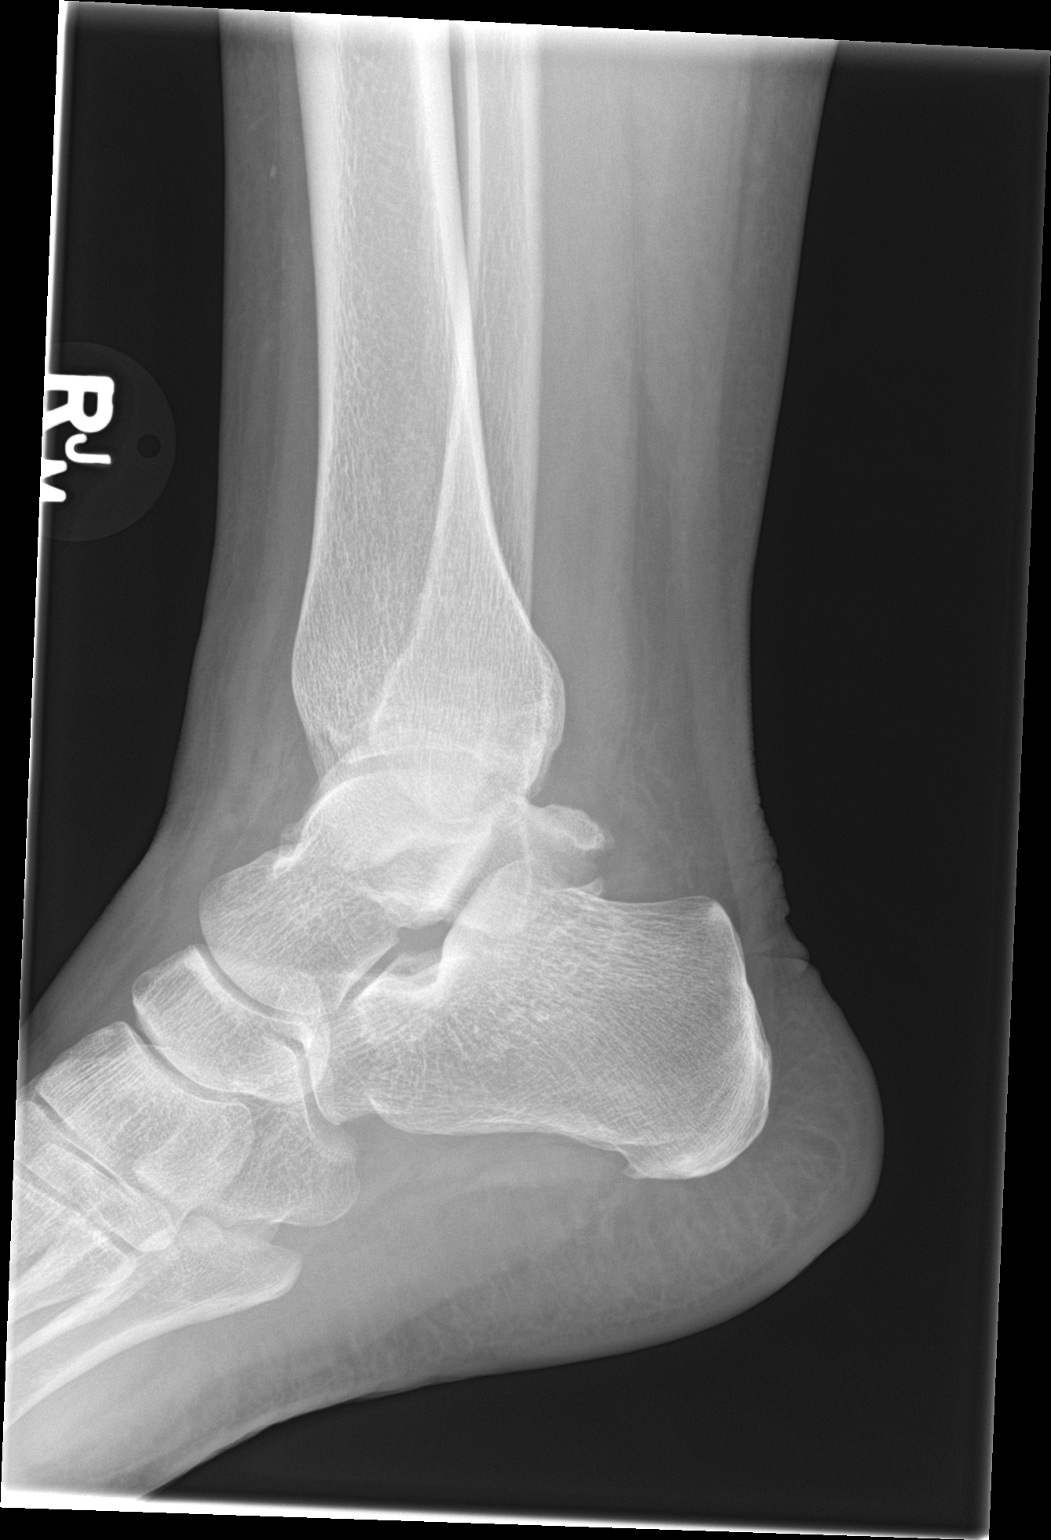

[3 of 3 positions shown; findings below may reference images not displayed]

FINDINGS: No evidence of fracture or dislocation. No evidence of arthropathy
or other bone abnormality. Diffuse soft tissue swelling is noted,
greatest along the lateral aspect of the ankle joint.
IMPRESSION: Soft tissue swelling.  No osseous abnormality.

## 2020-03-11 DIAGNOSIS — Z87442 Personal history of urinary calculi: Secondary | ICD-10-CM | POA: Insufficient documentation

## 2020-03-11 DIAGNOSIS — F419 Anxiety disorder, unspecified: Secondary | ICD-10-CM | POA: Insufficient documentation

## 2020-03-11 DIAGNOSIS — E785 Hyperlipidemia, unspecified: Secondary | ICD-10-CM | POA: Insufficient documentation

## 2020-03-11 DIAGNOSIS — F32A Depression, unspecified: Secondary | ICD-10-CM | POA: Insufficient documentation

## 2020-03-11 NOTE — Assessment & Plan Note (Signed)
Inc cymbalta 60 mg qd  F/u 6 months or sooner prn

## 2020-03-11 NOTE — Assessment & Plan Note (Signed)
Well controlled, no changes to meds. Encouraged heart healthy diet such as the DASH diet and exercise as tolerated.  °

## 2020-03-11 NOTE — Assessment & Plan Note (Signed)
Encouraged heart healthy diet, increase exercise, avoid trans fats, consider a krill oil cap daily 

## 2020-03-13 ENCOUNTER — Other Ambulatory Visit: Payer: Self-pay

## 2020-03-13 ENCOUNTER — Ambulatory Visit (INDEPENDENT_AMBULATORY_CARE_PROVIDER_SITE_OTHER): Payer: 59 | Admitting: Cardiology

## 2020-03-13 ENCOUNTER — Encounter: Payer: Self-pay | Admitting: Cardiology

## 2020-03-13 VITALS — BP 122/88 | HR 64 | Ht 68.0 in | Wt 196.0 lb

## 2020-03-13 DIAGNOSIS — R400 Somnolence: Secondary | ICD-10-CM

## 2020-03-13 DIAGNOSIS — I10 Essential (primary) hypertension: Secondary | ICD-10-CM

## 2020-03-13 DIAGNOSIS — I471 Supraventricular tachycardia: Secondary | ICD-10-CM | POA: Diagnosis not present

## 2020-03-13 DIAGNOSIS — E782 Mixed hyperlipidemia: Secondary | ICD-10-CM

## 2020-03-13 MED ORDER — ROSUVASTATIN CALCIUM 10 MG PO TABS: 10.0000 mg | ORAL_TABLET | Freq: Every day | ORAL | 3 refills | Status: DC

## 2020-03-13 NOTE — Patient Instructions (Addendum)
Medication Instructions:  Your physician has recommended you make the following change in your medication:  1.  START Crestor 10 mg taking 1 daily   *If you need a refill on your cardiac medications before your next appointment, please call your pharmacy*   Lab Work: TODAY: VIT D & LPA  If you have labs (blood work) drawn today and your tests are completely normal, you will receive your results only by: Marland Kitchen MyChart Message (if you have MyChart) OR . A paper copy in the mail If you have any lab test that is abnormal or we need to change your treatment, we will call you to review the results.   Testing/Procedures: Your physician has recommended that you have a sleep study. This test records several body functions during sleep, including: brain activity, eye movement, oxygen and carbon dioxide blood levels, heart rate and rhythm, breathing rate and rhythm, the flow of air through your mouth and nose, snoring, body muscle movements, and chest and belly movement.  Once they get authorization from your insurance company, they will reach out to you to schedule.     Follow-Up: At [facility], you and your health needs are our priority.  As part of our continuing mission to provide you with exceptional heart care, we have created designated Provider Care Teams.  These Care Teams include your primary Cardiologist (physician) and Advanced Practice Providers (APPs -  Physician Assistants and Nurse Practitioners) who all work together to provide you with the care you need, when you need it.  We recommend signing up for the patient portal called "MyChart".  Sign up information is provided on this After Visit Summary.  MyChart is used to connect with patients for Virtual Visits (Telemedicine).  Patients are able to view lab/test results, encounter notes, upcoming appointments, etc.  Non-urgent messages can be sent to your provider as well.   To learn more about what you can do with MyChart, go to  [email or web address].    Your next appointment:   6 month(s)  The format for your next appointment:   In Person  Provider:   Berniece Salines, DO   Other Instructions

## 2020-03-13 NOTE — Progress Notes (Signed)
Cardiology Office Note:    Date:  03/13/2020   ID:  Tina Frost, DOB [birth year], MRN [id number]  PCP:  Ann Held, DO  Cardiologist:  No primary care provider on file.  Electrophysiologist:  None   Referring MD: Carollee Herter, Alferd Apa, *   " I am still short of breath"  History of Present Illness:    Tina Frost is a 50 y.o. female with a hx hypertension, hyperlipidemia initially presented to be evaluated for dizziness and shortness of breath.  During her initial evaluation I had the patient undergo  a ZIO monitor.  She was able to get this testing done previously, after reported to the patient where her ZIO monitor showed evidence of rare supraventricular tachycardia which is likely atrial tachycardia, due to recent palpitations I started her on Toprol-XL 12.5 mg a day.  She had previously had a echocardiogram which was normal.  I saw the patient on 12/06/2019 her Dizziness had resolved but she reported chest pain. And given her family hx we recommended she get a CCTA. Her sleep study was also pending.   In there interim she got her CCTA which showed 0 calcium and no CAD.   She is here today for a follow up. She reports that she is still short of breath which she describes as intermittent.  She notes that the daytime somnolence is still pretty much on a daily basis with fatigue.  She had not had her sleep study yet and was concerned that she may not be able to tolerate CPAP even if she is diagnosed with sleep apnea.  Past Medical History:  Diagnosis Date  . Abdominal pain   . Anxiety   . 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 Atypical nevus 03/17/2017   right calf  . 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 Daytime somnolence 10/04/2019  . Depression   . DEPRESSION 04/27/2007   Qualifier: Diagnosis of  By: Jerold Coombe    . Depression, major, single episode, mild ([place]) 09/10/2019  . Dizzy 08/10/2019  . Elevated hemoglobin ([place]) 08/10/2019  .  Essential hypertension 08/10/2019  . External hemorrhoid 09/30/2016  . History of iron deficiency 04/02/2017  . History of kidney stones   . Hyperlipidemia   . Lower extremity edema 04/02/2017  . Mixed hyperlipidemia 10/04/2019  . NEPHROLITHIASIS, HX OF 08/29/2009   Qualifier: Diagnosis of  By: Jerold Coombe    . Obesity (BMI 30-39.9) 04/24/2013  . 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 Other fatigue 04/02/2017  . Overweight (BMI 25.0-29.9) 04/02/2017  . Palpitations 10/04/2019  . Peroneal tendinitis, right 03/16/2019  . Rectal bleeding 09/30/2016  . SOB (shortness of breath) 08/10/2019  . WEIGHT GAIN 04/27/2007   Qualifier: Diagnosis of  By: Jerold Coombe      Past Surgical History:  Procedure Laterality Date  . APPENDECTOMY    . COLONOSCOPY  2002  . HERNIA REPAIR  01/2015  . INSERTION OF MESH N/A 02/11/2015   Procedure: INSERTION OF MESH;  Surgeon: Ralene Ok, MD;  Location: [facility];  Service: General;  Laterality: N/A;  . KIDNEY STONE SURGERY  2008  . TUBAL LIGATION    . UMBILICAL HERNIA REPAIR N/A 02/11/2015   Procedure: LAPAROSCOPIC UMBILICAL HERNIA REPAIR WITH MESH;  Surgeon: Ralene Ok, MD;  Location: [place];  Service: General;  Laterality: N/A;    Current Medications: Current Meds  Medication Sig  . albuterol (VENTOLIN HFA) 108 (90 Base) MCG/ACT inhaler Inhale 2  puffs into the lungs every 6 (six) hours as needed for shortness of breath.  . ALPRAZolam (XANAX) 0.25 MG tablet Take 1 tablet (0.25 mg total) by mouth 3 (three) times daily as needed. for anxiety  . DULoxetine (CYMBALTA) 30 MG capsule Take 1 capsule (30 mg total) by mouth 2 (two) times daily.  Marland Kitchen levonorgestrel (MIRENA, 52 MG,) 20 MCG/24HR IUD Mirena 20 mcg/24 hours (6 yrs) 52 mg intrauterine device  Take 1 device by intrauterine route.  Marland Kitchen lisinopril-hydrochlorothiazide (ZESTORETIC) 20-12.5 MG tablet Take 1 tablet by mouth daily.  . metoprolol succinate (TOPROL XL) 25 MG 24 hr tablet Take 0.5 tablets (12.5 mg total) by mouth daily.   . nitroGLYCERIN (NITROSTAT) 0.4 MG SL tablet Place 1 tablet (0.4 mg total) under the tongue every 5 (five) minutes as needed.  . rosuvastatin (CRESTOR) 10 MG tablet Take 1 tablet (10 mg total) by mouth daily.     Allergies:   Amoxicillin and Sulfa antibiotics   Social History   Socioeconomic History  . Marital status: Married    Spouse name: Not on file  . Number of children: 3  . Years of education: Not on file  . Highest education level: Not on file  Occupational History  . Occupation: Owns Engineer, site  Tobacco Use  . Smoking status: Never Smoker  . Smokeless tobacco: Never Used  Substance and Sexual Activity  . Alcohol use: No  . Drug use: No  . Sexual activity: Yes    Partners: Male  Other Topics Concern  . Not on file  Social History Narrative   Gets reg exercise   Social Determinants of Health   Financial Resource Strain: Not on file  Food Insecurity: Not on file  Transportation Needs: Not on file  Physical Activity: Not on file  Stress: Not on file  Social Connections: Not on file     Family History: The patient's family history includes Alzheimer's disease in her maternal grandfather; Breast cancer in her mother; Hyperlipidemia in an other family member; Hypertension in an other family member; Kidney disease in an other family member; Lung cancer in her paternal grandmother; Stroke in her father. There is no history of Colon cancer, Rectal cancer, Esophageal cancer, or Liver cancer.  ROS:   Review of Systems  Constitution: Negative for decreased appetite, fever and weight gain.  HENT: Negative for congestion, ear discharge, hoarse voice and sore throat.   Eyes: Negative for discharge, redness, vision loss in right eye and visual halos.  Cardiovascular: Negative for chest pain, dyspnea on exertion, leg swelling, orthopnea and palpitations.  Respiratory: Negative for cough, hemoptysis, shortness of breath and snoring.   Endocrine: Negative for heat intolerance  and polyphagia.  Hematologic/Lymphatic: Negative for bleeding problem. Does not bruise/bleed easily.  Skin: Negative for flushing, nail changes, rash and suspicious lesions.  Musculoskeletal: Negative for arthritis, joint pain, muscle cramps, myalgias, neck pain and stiffness.  Gastrointestinal: Negative for abdominal pain, bowel incontinence, diarrhea and excessive appetite.  Genitourinary: Negative for decreased libido, genital sores and incomplete emptying.  Neurological: Negative for brief paralysis, focal weakness, headaches and loss of balance.  Psychiatric/Behavioral: Negative for altered mental status, depression and suicidal ideas.  Allergic/Immunologic: Negative for HIV exposure and persistent infections.    EKGs/Labs/Other Studies Reviewed:    The following studies were reviewed today:   EKG:  The ekg ordered today demonstrates   TTE IMPRESSIONS  1. Left ventricular ejection fraction, by estimation, is 55 to 60%. The left ventricle has normal function.  The left ventricle has no regional wall motion abnormalities. Left ventricular diastolic parameters are indeterminate. The average left  ventricular global longitudinal strain is -17.8 %. The global longitudinal strain is normal.  2. Right ventricular systolic function is normal. The right ventricular size is normal. There is normal pulmonary artery systolic pressure.  3. Left atrial size was mildly dilated.  4. The mitral valve is normal in structure. Trivial mitral valve regurgitation. No evidence of mitral stenosis.  5. The aortic valve is tricuspid. Aortic valve regurgitation is not visualized. No aortic stenosis is present.  6. The inferior vena cava is normal in size with greater than 50% respiratory variability, suggesting right atrial pressure of 3 mmHg.    Zio monitor  The patient wore the monitor for 30 days 5 hours starting September 28, 2019. Indication: Palpitations.  The minimum heart rate was 49 bpm, maximum  heart rate was 169 bpm, and average heart rate was 80 bpm. Predominant underlying rhythm was Sinus Rhythm.  4 Supraventricular Tachycardia runs occurred, the run with the fastest interval lasting 8 beats with a maximum rate of 169 bpm, the longest lasting 13 beats with an average rate of 130 bpm.    Premature atrial complexes were rare (<1.0%). Premature Ventricular complexes were rare (<1.0%).  No ventricular tachycardia, no pauses, No AV block and no atrial fibrillation present. 16 triggered events 3 associated with premature atrial complex, no remaining associated with sinus tachycardia and sinus rhythm.  5 diary events are associated with sinus rhythm.   Conclusion: This study is remarkable for paroxysmal ventricular tachycardia, which is likely atrial tachycardia with AV block.   CCTA The patient wore the monitor for 30 days 5 hours starting September 28, 2019. Indication: Palpitations.  The minimum heart rate was 49 bpm, maximum heart rate was 169 bpm, and average heart rate was 80 bpm. Predominant underlying rhythm was Sinus Rhythm.  4 Supraventricular Tachycardia runs occurred, the run with the fastest interval lasting 8 beats with a maximum rate of 169 bpm, the longest lasting 13 beats with an average rate of 130 bpm.    Premature atrial complexes were rare (<1.0%). Premature Ventricular complexes were rare (<1.0%).  No ventricular tachycardia, no pauses, No AV block and no atrial fibrillation present. 16 triggered events 3 associated with premature atrial complex, no remaining associated with sinus tachycardia and sinus rhythm.  5 diary events are associated with sinus rhythm.   Conclusion: This study is remarkable for paroxysmal ventricular tachycardia, which is likely atrial tachycardia with AV block.   Recent Labs: 07/26/2019: Pro B Natriuretic peptide (BNP) 94.0 08/08/2019: B Natriuretic Peptide 52.0 03/07/2020: ALT 26; BUN 8; Creatinine, Ser 0.68; Hemoglobin  14.8; Platelets 218.0; Potassium 4.0; Sodium 141; TSH 0.64  Recent Lipid Panel    Component Value Date/Time   CHOL 212 (H) 03/07/2020 1434   TRIG 101.0 03/07/2020 1434   HDL 58.80 03/07/2020 1434   CHOLHDL 4 03/07/2020 1434   VLDL 20.2 03/07/2020 1434   LDLCALC 133 (H) 03/07/2020 1434    Physical Exam:    VS:  BP 122/88   Pulse 64   Ht 5\' 8"  (1.727 m)   Wt 196 lb (88.9 kg)   SpO2 98%   BMI 29.80 kg/m     Wt Readings from Last 3 Encounters:  03/13/20 196 lb (88.9 kg)  03/07/20 198 lb 9.6 oz (90.1 kg)  12/06/19 196 lb (88.9 kg)     GEN: Well nourished, well developed in no acute distress HEENT:  Normal NECK: No JVD; No carotid bruits LYMPHATICS: No lymphadenopathy CARDIAC: S1S2 noted,RRR, no murmurs, rubs, gallops RESPIRATORY:  Clear to auscultation without rales, wheezing or rhonchi  ABDOMEN: Soft, non-tender, non-distended, +bowel sounds, no guarding. EXTREMITIES: No edema, No cyanosis, no clubbing MUSCULOSKELETAL:  No deformity  SKIN: Warm and dry NEUROLOGIC:  Alert and oriented x 3, non-focal PSYCHIATRIC:  Normal affect, good insight  ASSESSMENT:    1. Essential hypertension   2. Mixed hyperlipidemia   3. PAT (paroxysmal atrial tachycardia) ([place])   4. Daytime somnolence    PLAN:     1.  Her blood pressure is acceptable today in the office. 2.  I reviewed her lipid profile from her PCP which show evidence of LDL 133, HDL 58, total cholesterol 112 and triglyceride 101.  She is agreeable to start Crestor 10 mg daily. 3.  For fatigue we will also get vitamin D level to make sure vitamin D deficiency is not playing a role. 4. she is agreeable to now pursue sleep study for her daytime somnolence for sleep apnea.   The patient is in agreement with the above plan. The patient left the office in stable condition.  The patient will follow up in 1 year or sooner if needed.   Medication Adjustments/Labs and Tests Ordered: Current medicines are reviewed at length with  the patient today.  Concerns regarding medicines are outlined above.  Orders Placed This Encounter  Procedures  . Vitamin D 1,25 dihydroxy  . Lipoprotein A (LPA)  . Split night study   Meds ordered this encounter  Medications  . rosuvastatin (CRESTOR) 10 MG tablet    Sig: Take 1 tablet (10 mg total) by mouth daily.    Dispense:  90 tablet    Refill:  3    Patient Instructions  Medication Instructions:  Your physician has recommended you make the following change in your medication:  1.  START Crestor 10 mg taking 1 daily   *If you need a refill on your cardiac medications before your next appointment, please call your pharmacy*   Lab Work: TODAY: VIT D & LPA  If you have labs (blood work) drawn today and your tests are completely normal, you will receive your results only by: Marland Kitchen MyChart Message (if you have MyChart) OR . A paper copy in the mail If you have any lab test that is abnormal or we need to change your treatment, we will call you to review the results.   Testing/Procedures: Your physician has recommended that you have a sleep study. This test records several body functions during sleep, including: brain activity, eye movement, oxygen and carbon dioxide blood levels, heart rate and rhythm, breathing rate and rhythm, the flow of air through your mouth and nose, snoring, body muscle movements, and chest and belly movement.  Once they get authorization from your insurance company, they will reach out to you to schedule.     Follow-Up: At [facility], you and your health needs are our priority.  As part of our continuing mission to provide you with exceptional heart care, we have created designated Provider Care Teams.  These Care Teams include your primary Cardiologist (physician) and Advanced Practice Providers (APPs -  Physician Assistants and Nurse Practitioners) who all work together to provide you with the care you need, when you need it.  We recommend signing up  for the patient portal called "MyChart".  Sign up information is provided on this After Visit Summary.  MyChart is used  to connect with patients for Virtual Visits (Telemedicine).  Patients are able to view lab/test results, encounter notes, upcoming appointments, etc.  Non-urgent messages can be sent to your provider as well.   To learn more about what you can do with MyChart, go to [email or web address].    Your next appointment:   6 month(s)  The format for your next appointment:   In Person  Provider:   Berniece Salines, DO   Other Instructions      Adopting a Healthy Lifestyle.  Know what a healthy weight is for you (roughly BMI <25) and aim to maintain this   Aim for 7+ servings of fruits and vegetables daily   65-80+ fluid ounces of water or unsweet tea for healthy kidneys   Limit to max 1 drink of alcohol per day; avoid smoking/tobacco   Limit animal fats in diet for cholesterol and heart health - choose grass fed whenever available   Avoid highly processed foods, and foods high in saturated/trans fats   Aim for low stress - take time to unwind and care for your mental health   Aim for 150 min of moderate intensity exercise weekly for heart health, and weights twice weekly for bone health   Aim for 7-9 hours of sleep daily   When it comes to diets, agreement about the perfect plan isnt easy to find, even among the experts. Experts at the [employer] developed an idea known as the Healthy Eating Plate. Just imagine a plate divided into logical, healthy portions.   The emphasis is on diet quality:   Load up on vegetables and fruits - one-half of your plate: Aim for color and variety, and remember that potatoes dont count.   Go for whole grains - one-quarter of your plate: Whole wheat, barley, wheat berries, quinoa, oats, brown rice, and foods made with them. If you want pasta, go with whole wheat pasta.   Protein power - one-quarter of your  plate: Fish, chicken, beans, and nuts are all healthy, versatile protein sources. Limit red meat.   The diet, however, does go beyond the plate, offering a few other suggestions.   Use healthy plant oils, such as olive, canola, soy, corn, sunflower and peanut. Check the labels, and avoid partially hydrogenated oil, which have unhealthy trans fats.   If youre thirsty, drink water. Coffee and tea are good in moderation, but skip sugary drinks and limit milk and dairy products to one or two daily servings.   The type of carbohydrate in the diet is more important than the amount. Some sources of carbohydrates, such as vegetables, fruits, whole grains, and beans-are healthier than others.   Finally, stay active  Signed, Berniece Salines, DO  03/13/2020 12:01 PM    [facility]

## 2020-03-14 ENCOUNTER — Ambulatory Visit: Payer: Self-pay | Admitting: Family Medicine

## 2020-03-16 ENCOUNTER — Emergency Department (HOSPITAL_BASED_OUTPATIENT_CLINIC_OR_DEPARTMENT_OTHER)
Admission: EM | Admit: 2020-03-16 | Discharge: 2020-03-16 | Disposition: A | Discharge status: Home / Self Care | Payer: 59 | Attending: Emergency Medicine | Admitting: Emergency Medicine

## 2020-03-16 ENCOUNTER — Other Ambulatory Visit: Payer: Self-pay

## 2020-03-16 ENCOUNTER — Emergency Department (HOSPITAL_BASED_OUTPATIENT_CLINIC_OR_DEPARTMENT_OTHER): Payer: 59

## 2020-03-16 DIAGNOSIS — R109 Unspecified abdominal pain: Secondary | ICD-10-CM

## 2020-03-16 DIAGNOSIS — Z79899 Other long term (current) drug therapy: Secondary | ICD-10-CM | POA: Diagnosis not present

## 2020-03-16 DIAGNOSIS — N159 Renal tubulo-interstitial disease, unspecified: Secondary | ICD-10-CM | POA: Diagnosis not present

## 2020-03-16 DIAGNOSIS — I10 Essential (primary) hypertension: Secondary | ICD-10-CM | POA: Diagnosis not present

## 2020-03-16 DIAGNOSIS — R350 Frequency of micturition: Secondary | ICD-10-CM

## 2020-03-16 LAB — CBC WITH DIFFERENTIAL/PLATELET
Abs Immature Granulocytes: 0.03 10*3/uL (ref 0.00–0.07)
Basophils Absolute: 0.1 10*3/uL (ref 0.0–0.1)
Basophils Relative: 1 %
Eosinophils Absolute: 0.1 10*3/uL (ref 0.0–0.5)
Eosinophils Relative: 1 %
HCT: 43 % (ref 36.0–46.0)
Hemoglobin: 15.2 g/dL — ABNORMAL HIGH (ref 12.0–15.0)
Immature Granulocytes: 0 %
Lymphocytes Relative: 12 %
Lymphs Abs: 1.4 10*3/uL (ref 0.7–4.0)
MCH: 31.9 pg (ref 26.0–34.0)
MCHC: 35.3 g/dL (ref 30.0–36.0)
MCV: 90.1 fL (ref 80.0–100.0)
Monocytes Absolute: 0.7 10*3/uL (ref 0.1–1.0)
Monocytes Relative: 6 %
Neutro Abs: 9.2 10*3/uL — ABNORMAL HIGH (ref 1.7–7.7)
Neutrophils Relative %: 80 %
Platelets: 235 10*3/uL (ref 150–400)
RBC: 4.77 MIL/uL (ref 3.87–5.11)
RDW: 13.2 % (ref 11.5–15.5)
WBC: 11.6 10*3/uL — ABNORMAL HIGH (ref 4.0–10.5)
nRBC: 0 % (ref 0.0–0.2)

## 2020-03-16 LAB — COMPREHENSIVE METABOLIC PANEL
ALT: 20 U/L (ref 0–44)
AST: 16 U/L (ref 15–41)
Albumin: 4 g/dL (ref 3.5–5.0)
Alkaline Phosphatase: 69 U/L (ref 38–126)
Anion gap: 11 (ref 5–15)
BUN: 13 mg/dL (ref 6–20)
CO2: 25 mmol/L (ref 22–32)
Calcium: 9 mg/dL (ref 8.9–10.3)
Chloride: 103 mmol/L (ref 98–111)
Creatinine, Ser: 0.61 mg/dL (ref 0.44–1.00)
GFR, Estimated: >60 mL/min (ref >60)
Glucose, Bld: 93 mg/dL (ref 70–99)
Potassium: 4 mmol/L (ref 3.5–5.1)
Sodium: 139 mmol/L (ref 135–145)
Total Bilirubin: 1.3 mg/dL — ABNORMAL HIGH (ref 0.3–1.2)
Total Protein: 6.8 g/dL (ref 6.5–8.1)

## 2020-03-16 LAB — URINALYSIS, ROUTINE W REFLEX MICROSCOPIC
Bilirubin Urine: NEGATIVE
Glucose, UA: NEGATIVE mg/dL
Ketones, ur: NEGATIVE mg/dL
Nitrite: POSITIVE — AB
Protein, ur: 100 mg/dL — AB
Specific Gravity, Urine: 1.02 (ref 1.005–1.030)
pH: 6 (ref 5.0–8.0)

## 2020-03-16 LAB — PREGNANCY, URINE: Preg Test, Ur: NEGATIVE

## 2020-03-16 LAB — URINALYSIS, MICROSCOPIC (REFLEX)
RBC / HPF: >50 RBC/hpf (ref 0–5)
WBC, UA: >50 WBC/hpf (ref 0–5)

## 2020-03-16 MED ORDER — KETOROLAC TROMETHAMINE 30 MG/ML IJ SOLN
30.0000 mg | Freq: Once | INTRAMUSCULAR | Status: AC
  Administered 2020-03-16: 30 mg via INTRAVENOUS
  Filled 2020-03-16: qty 1

## 2020-03-16 MED ORDER — PHENAZOPYRIDINE HCL 200 MG PO TABS: 200.0000 mg | ORAL_TABLET | Freq: Three times a day (TID) | ORAL | 0 refills | Status: AC

## 2020-03-16 MED ORDER — ONDANSETRON 4 MG PO TBDP: 4.0000 mg | ORAL_TABLET | Freq: Three times a day (TID) | ORAL | 0 refills | Status: AC | PRN

## 2020-03-16 MED ORDER — SODIUM CHLORIDE 0.9 % IV BOLUS
1000.0000 mL | Freq: Once | INTRAVENOUS | Status: AC
  Administered 2020-03-16: 1000 mL via INTRAVENOUS

## 2020-03-16 MED ORDER — CEPHALEXIN 500 MG PO CAPS: 500.0000 mg | ORAL_CAPSULE | Freq: Three times a day (TID) | ORAL | 0 refills | Status: AC

## 2020-03-16 NOTE — ED Provider Notes (Signed)
[facility] Provider Note   CSN: [id number] Arrival date & time: 03/16/20  9678     History Chief Complaint  Patient presents with  . Dysuria  . Flank Pain    Tina Frost is a 50 y.o. female.  HPI      50yo female with history of hyperlipidemia, hypertension, paroxysmal atrial tachycardia, anxiety presents with concern for urinary symptoms and left flank pain.  Reports dysuria, frequency, lower abdominal burning for 2 days and left flank pain and abdominal pain beginning last night. Pain 5/10 right now, described as burning, constant pain. Has history of kidney stones, bilateral kidney stones, somewhat similar.   Vaginal discharge but no change.   Denies fever, nausea, vomiting, diarrhea or constipation.    Past Medical History:  Diagnosis Date  . Abdominal pain   . Anxiety   . 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 Atypical nevus 03/17/2017   right calf  . 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 Daytime somnolence 10/04/2019  . Depression   . DEPRESSION 04/27/2007   Qualifier: Diagnosis of  By: Jerold Coombe    . Depression, major, single episode, mild ([place]) 09/10/2019  . Dizzy 08/10/2019  . Elevated hemoglobin ([place]) 08/10/2019  . Essential hypertension 08/10/2019  . External hemorrhoid 09/30/2016  . History of iron deficiency 04/02/2017  . History of kidney stones   . Hyperlipidemia   . Lower extremity edema 04/02/2017  . Mixed hyperlipidemia 10/04/2019  . NEPHROLITHIASIS, HX OF 08/29/2009   Qualifier: Diagnosis of  By: Jerold Coombe    . Obesity (BMI 30-39.9) 04/24/2013  . 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 Other fatigue 04/02/2017  . Overweight (BMI 25.0-29.9) 04/02/2017  . Palpitations 10/04/2019  . Peroneal tendinitis, right 03/16/2019  . Rectal bleeding 09/30/2016  . SOB (shortness of breath) 08/10/2019  . WEIGHT GAIN 04/27/2007   Qualifier: Diagnosis of  By: Jerold Coombe      Patient Active Problem List   Diagnosis Date Noted   . PAT (paroxysmal atrial tachycardia) ([place]) 03/13/2020  . Hyperlipidemia   . History of kidney stones   . Depression   . Anxiety   . Daytime somnolence 10/04/2019  . Palpitations 10/04/2019  . Mixed hyperlipidemia 10/04/2019  . Depression, major, single episode, mild ([place]) 09/10/2019  . Essential hypertension 08/10/2019  . Elevated hemoglobin ([place]) 08/10/2019  . Dizzy 08/10/2019  . SOB (shortness of breath) 08/10/2019  . 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 Peroneal tendinitis, right 03/16/2019  . 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 Overweight (BMI 25.0-29.9) 04/02/2017  . Other fatigue 04/02/2017  . History of iron deficiency 04/02/2017  . Lower extremity edema 04/02/2017  . Atypical nevus 03/17/2017  . Rectal bleeding 09/30/2016  . 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 External hemorrhoid 09/30/2016  . Obesity (BMI 30-39.9) 04/24/2013  . NEPHROLITHIASIS, HX OF 08/29/2009  . DEPRESSION 04/27/2007  . WEIGHT GAIN 04/27/2007    Past Surgical History:  Procedure Laterality Date  . APPENDECTOMY    . COLONOSCOPY  2002  . HERNIA REPAIR  01/2015  . INSERTION OF MESH N/A 02/11/2015   Procedure: INSERTION OF MESH;  Surgeon: Ralene Ok, MD;  Location: [facility];  Service: General;  Laterality: N/A;  . KIDNEY STONE SURGERY  2008  . TUBAL LIGATION    . UMBILICAL HERNIA REPAIR N/A 02/11/2015   Procedure: LAPAROSCOPIC UMBILICAL HERNIA REPAIR WITH MESH;  Surgeon: Ralene Ok, MD;  Location: [facility];  Service: General;  Laterality: N/A;     OB History   No obstetric history on file.     Family History  Problem Relation Age of Onset  . Breast cancer Mother   . Lung cancer Paternal Grandmother   . Stroke Father   . Hypertension Other   . Hyperlipidemia Other   . Kidney disease Other        kidney stones  . Alzheimer's disease Maternal Grandfather   . Colon cancer Neg Hx   . Rectal cancer Neg Hx   . Esophageal cancer Neg Hx   . Liver cancer Neg Hx     Social  History   Tobacco Use  . Smoking status: Never Smoker  . Smokeless tobacco: Never Used  Substance Use Topics  . Alcohol use: No  . Drug use: No    Home Medications Prior to Admission medications   Medication Sig Start Date End Date Taking? Authorizing Provider  cephALEXin (KEFLEX) 500 MG capsule Take 1 capsule (500 mg total) by mouth 3 (three) times daily for 10 days. 03/16/20 03/26/20 Yes Gareth Morgan, MD  ondansetron (ZOFRAN ODT) 4 MG disintegrating tablet Take 1 tablet (4 mg total) by mouth every 8 (eight) hours as needed for nausea or vomiting. 03/16/20  Yes Gareth Morgan, MD  phenazopyridine (PYRIDIUM) 200 MG tablet Take 1 tablet (200 mg total) by mouth 3 (three) times daily. 03/16/20  Yes Gareth Morgan, MD  albuterol (VENTOLIN HFA) 108 (90 Base) MCG/ACT inhaler Inhale 2 puffs into the lungs every 6 (six) hours as needed for shortness of breath. 07/25/19   Saguier, Percell Miller, PA-C  ALPRAZolam Duanne Moron) 0.25 MG tablet Take 1 tablet (0.25 mg total) by mouth 3 (three) times daily as needed. for anxiety 02/13/19   Carollee Herter, Kendrick Fries R, DO  DULoxetine (CYMBALTA) 30 MG capsule Take 1 capsule (30 mg total) by mouth 2 (two) times daily. 03/07/20   Ann Held, DO  levonorgestrel (MIRENA, 52 MG,) 20 MCG/24HR IUD Mirena 20 mcg/24 hours (6 yrs) 52 mg intrauterine device  Take 1 device by intrauterine route.    [provider]  lisinopril-hydrochlorothiazide (ZESTORETIC) 20-12.5 MG tablet Take 1 tablet by mouth daily. 09/05/19   Ann Held, DO  metoprolol succinate (TOPROL XL) 25 MG 24 hr tablet Take 0.5 tablets (12.5 mg total) by mouth daily. 10/31/19   Tobb, Kardie, DO  nitroGLYCERIN (NITROSTAT) 0.4 MG SL tablet Place 1 tablet (0.4 mg total) under the tongue every 5 (five) minutes as needed. 12/06/19 03/05/20  Tobb, Kardie, DO  rosuvastatin (CRESTOR) 10 MG tablet Take 1 tablet (10 mg total) by mouth daily. 03/13/20 06/11/20  Tobb, Godfrey Pick, DO    Allergies    Amoxicillin  and Sulfa antibiotics  Review of Systems   Review of Systems  Constitutional: Negative for fever.  Respiratory: Negative for cough.   Gastrointestinal: Positive for abdominal pain. Negative for constipation, diarrhea, nausea and vomiting.  Genitourinary: Positive for dysuria, flank pain and frequency.  Musculoskeletal: Positive for back pain.  Skin: Negative for wound.    Physical Exam Updated Vital Signs BP (!) 131/97   Pulse 61   Temp 98 F (36.7 C) (Oral)   Resp 16   LMP 02/24/2020 (Approximate)   SpO2 100%   Physical Exam Vitals and nursing note reviewed.  Constitutional:      General: She is not in acute distress.    Appearance: Normal appearance. She is not ill-appearing, toxic-appearing or diaphoretic.  HENT:     Head: Normocephalic.  Eyes:     Conjunctiva/sclera: Conjunctivae normal.  Cardiovascular:     Rate and Rhythm: Normal rate and regular rhythm.     Pulses: Normal pulses.  Pulmonary:     Effort: Pulmonary effort is normal. No respiratory distress.  Abdominal:     General: There is no distension.     Tenderness: There is abdominal tenderness (suprapubic, left lower). There is left CVA tenderness (mild, and lower back).  Musculoskeletal:        General: No deformity or signs of injury.     Cervical back: No rigidity.  Skin:    General: Skin is warm and dry.     Coloration: Skin is not jaundiced or pale.  Neurological:     General: No focal deficit present.     Mental Status: She is alert and oriented to person, place, and time.     ED Results / Procedures / Treatments   Labs (all labs ordered are listed, but only abnormal results are displayed) Labs Reviewed  URINALYSIS, ROUTINE W REFLEX MICROSCOPIC - Abnormal; Notable for the following components:      Result Value   APPearance CLOUDY (*)    Hgb urine dipstick LARGE (*)    Protein, ur 100 (*)    Nitrite POSITIVE (*)    Leukocytes,Ua LARGE (*)    All other components within normal limits   CBC WITH DIFFERENTIAL/PLATELET - Abnormal; Notable for the following components:   WBC 11.6 (*)    Hemoglobin 15.2 (*)    Neutro Abs 9.2 (*)    All other components within normal limits  COMPREHENSIVE METABOLIC PANEL - Abnormal; Notable for the following components:   Total Bilirubin 1.3 (*)    All other components within normal limits  URINALYSIS, MICROSCOPIC (REFLEX) - Abnormal; Notable for the following components:   Bacteria, UA MANY (*)    All other components within normal limits  URINE CULTURE  PREGNANCY, URINE    EKG None  Radiology CT Renal Stone Study  Result Date: 03/16/2020 CLINICAL DATA:  Left flank pain since last night. EXAM: CT ABDOMEN AND PELVIS WITHOUT CONTRAST TECHNIQUE: Multidetector CT imaging of the abdomen and pelvis was performed following the standard protocol without IV contrast. COMPARISON:  December 25, 2019 FINDINGS: Lower chest: Minimal dependent atelectasis of posterior lung bases are noted. Heart size is normal. Hepatobiliary: No focal liver abnormality is seen. No gallstones, gallbladder wall thickening, or biliary dilatation. Pancreas: Unremarkable. No pancreatic ductal dilatation or surrounding inflammatory changes. Spleen: Normal in size without focal abnormality. Adrenals/Urinary Tract: 1 mm to 2 mm nonobstructing stones are identified in bilateral kidneys. There is dilatation of the left ureter with surrounding inflammation but no definite discrete stone is identified in the left collecting system. The bladder is partially decompressed but normal. The bilateral adrenal glands are normal. Stomach/Bowel: There is a small hiatal hernia. There is no small bowel obstruction or diverticulitis. Extensive bowel content is identified in the colon. The appendix is not seen but no inflammation is noted around cecum. Vascular/Lymphatic: Left periaortic lymph nodes are identified unchanged. The aorta is normal. Reproductive: IUD is identified in the uterus. Uterine  fibroid is identified unchanged. Right ovarian cyst is noted. Other: No abdominal wall hernia or abnormality. Ventral hernia mesh is noted. No abdominopelvic ascites. Musculoskeletal: No acute or significant osseous findings. IMPRESSION: 1. There is dilatation of the left ureter with surrounding inflammation but no definite discrete stone is identified in the left collecting system. The findings are nonspecific but can  be seen in recently passed stone. 2. 1 mm to 2 mm nonobstructing stones are identified in bilateral kidneys. 3. Extensive bowel content is identified in the colon. This can be seen in constipation. Electronically Signed   By: Abelardo Diesel M.D.   On: 03/16/2020 08:53    Procedures Procedures (including critical care time)  Medications Ordered in ED Medications  sodium chloride 0.9 % bolus 1,000 mL (1,000 mLs Intravenous New Bag/Given 03/16/20 0814)  ketorolac (TORADOL) 30 MG/ML injection 30 mg (30 mg Intravenous Given 03/16/20 0813)    ED Course  I have reviewed the triage vital signs and the nursing notes.  Pertinent labs & imaging results that were available during my care of the patient were reviewed by me and considered in my medical decision making (see chart for details).    MDM Rules/Calculators/A&P                          50yo female with history of hyperlipidemia, hypertension, paroxysmal atrial tachycardia, anxiety presents with concern for urinary symptoms and left flank pain.  DDx includes nephrolithiasis, pyelonephritis, diverticulitis, PID, TOA, ovarian torsion. Pregnancy test negative and no signs of ectopic pregnancy.  Lower suspicion for TOA, PID, torsion, diverticulitis by history and exam.   Urinalysis shows signs of urinary tract infection.  CBC with mild leukocytosis.  CT stone study shows findings of nonobstructing stone and dilation of left ureter with surrounding inflammation but no definite discrete stone.  Suspect these findings and clinical picture  represent pyelonephritis.  Given IV fluids and toradol,  Given rx for keflex, pyridium, zofran and recommend continued supportive care tylenol, ibuprofen for pain. Patient discharged in stable condition with understanding of reasons to return.     Final Clinical Impression(s) / ED Diagnoses Final diagnoses:  Left flank pain  Urinary frequency  Kidney infection    Rx / DC Orders ED Discharge Orders         Ordered    cephALEXin (KEFLEX) 500 MG capsule  3 times daily        03/16/20 0910    phenazopyridine (PYRIDIUM) 200 MG tablet  3 times daily        03/16/20 0910    ondansetron (ZOFRAN ODT) 4 MG disintegrating tablet  Every 8 hours PRN        03/16/20 0910           Gareth Morgan, MD 03/16/20 [phone]

## 2020-03-16 NOTE — ED Notes (Signed)
UA and UC sent to lab 

## 2020-03-16 NOTE — ED Triage Notes (Signed)
Patient reports dysuria, left flank pain, hx of kidney stones and UTI's

## 2020-03-18 LAB — URINE CULTURE: Culture: >=100000 — AB

## 2020-03-21 ENCOUNTER — Telehealth: Payer: Self-pay | Admitting: *Deleted

## 2020-03-21 DIAGNOSIS — Z006 Encounter for examination for normal comparison and control in clinical research program: Secondary | ICD-10-CM

## 2020-03-21 NOTE — Telephone Encounter (Signed)
I called patient for 90-day phone call for CAD-Fem study.  I left message for patient to call me back.

## 2020-03-22 ENCOUNTER — Telehealth: Payer: Self-pay | Admitting: Cardiology

## 2020-03-22 LAB — VITAMIN D 1,25 DIHYDROXY
Vitamin D 1, 25 (OH)2 Total: 76 pg/mL — ABNORMAL HIGH
Vitamin D2 1, 25 (OH)2: <10 pg/mL
Vitamin D3 1, 25 (OH)2: 71 pg/mL

## 2020-03-22 LAB — LIPOPROTEIN A (LPA): Lipoprotein (a): 22.4 nmol/L (ref <75.0)

## 2020-03-22 NOTE — Telephone Encounter (Signed)
Patient states she is returning a call to [facility], however, he is unsure what the call was regarding.

## 2020-03-28 ENCOUNTER — Telehealth: Payer: Self-pay | Admitting: *Deleted

## 2020-03-28 DIAGNOSIS — Z006 Encounter for examination for normal comparison and control in clinical research program: Secondary | ICD-10-CM

## 2020-03-28 NOTE — Telephone Encounter (Signed)
I caI called patient for 90 day phone call for CAD-Fem  Study. I left message for patient to call me back.

## 2020-05-15 ENCOUNTER — Telehealth: Payer: Self-pay

## 2020-05-15 DIAGNOSIS — Z006 Encounter for examination for normal comparison and control in clinical research program: Secondary | ICD-10-CM

## 2020-05-15 NOTE — Telephone Encounter (Signed)
I called patient for 90 day phone call for Identify  Study. I left message for patient to call me back and I followed up with an e-mail.

## 2020-08-16 ENCOUNTER — Other Ambulatory Visit: Payer: Self-pay | Admitting: Family Medicine

## 2020-08-16 DIAGNOSIS — I10 Essential (primary) hypertension: Secondary | ICD-10-CM

## 2020-09-08 ENCOUNTER — Encounter: Payer: Self-pay | Admitting: Family Medicine

## 2020-09-23 ENCOUNTER — Encounter: Payer: Self-pay | Admitting: Cardiology

## 2020-09-23 ENCOUNTER — Ambulatory Visit (INDEPENDENT_AMBULATORY_CARE_PROVIDER_SITE_OTHER): Payer: 59 | Admitting: Cardiology

## 2020-09-23 ENCOUNTER — Other Ambulatory Visit: Payer: Self-pay

## 2020-09-23 VITALS — BP 126/88 | HR 61 | Ht 68.0 in | Wt 205.4 lb

## 2020-09-23 DIAGNOSIS — I471 Supraventricular tachycardia: Secondary | ICD-10-CM

## 2020-09-23 DIAGNOSIS — E669 Obesity, unspecified: Secondary | ICD-10-CM

## 2020-09-23 DIAGNOSIS — E782 Mixed hyperlipidemia: Secondary | ICD-10-CM

## 2020-09-23 DIAGNOSIS — I10 Essential (primary) hypertension: Secondary | ICD-10-CM

## 2020-09-23 MED ORDER — POTASSIUM CHLORIDE ER 10 MEQ PO TBCR: 10.0000 meq | EXTENDED_RELEASE_TABLET | ORAL | 3 refills | Status: DC

## 2020-09-23 MED ORDER — FUROSEMIDE 20 MG PO TABS: 20.0000 mg | ORAL_TABLET | ORAL | 3 refills | Status: DC

## 2020-09-23 NOTE — Progress Notes (Signed)
Cardiology Office Note:    Date:  09/23/2020   ID:  Tina Frost, DOB [birth year], MRN [id number]:[id number]  PCP:  Tina Frost, Tina Apa, DO  Cardiologist:  Berniece Salines, DO  Electrophysiologist:  None   Referring MD: Tina Frost, Tina Frost, *   " I am having leg swelling and shortness of breath"   History of Present Illness:    Tina Frost is a 50 y.o. female with a hx of hypertension, hyperlipidemia paroxysmal atrial tachycardia on Toprol-XL, obesity is here today for follow-up visit due to significant leg swelling and intermittent shortness of breath.  I last saw the patient on March 13, 2018 at that time she was experiencing some intermittent shortness of breath which I suspected was likely in the setting of undiagnosed sleep apnea.  Today she tells me that she has been experiencing some bilateral leg edema as well as shortness of breath.  She notes that she has also gained some weight giving the leg swelling.  No chest pain no lightheadedness or fatigue.    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   Daytime somnolence 10/04/2019   Depression    DEPRESSION 04/27/2007   Qualifier: Diagnosis of  By: Jerold Coombe     Depression, major, single episode, mild ([place]) 09/10/2019   Dizzy 08/10/2019   Elevated hemoglobin ([place]) 08/10/2019   Essential hypertension 08/10/2019   External hemorrhoid 09/30/2016   History of iron deficiency 04/02/2017   History of kidney stones    Hyperlipidemia    Lower extremity edema 04/02/2017   Mixed hyperlipidemia 10/04/2019   NEPHROLITHIASIS, HX OF 08/29/2009   Qualifier: Diagnosis of  By: Jerold Coombe     Obesity (BMI 30-39.9) 04/24/2013   Os trigonum 03/16/2019   Other fatigue 04/02/2017   Overweight (BMI 25.0-29.9) 04/02/2017   Palpitations 10/04/2019   Peroneal tendinitis, right 03/16/2019   Rectal  bleeding 09/30/2016   SOB (shortness of breath) 08/10/2019   WEIGHT GAIN 04/27/2007   Qualifier: Diagnosis of  By: Jerold Coombe      Past Surgical History:  Procedure Laterality Date   APPENDECTOMY     COLONOSCOPY  2002   HERNIA REPAIR  01/2015   INSERTION OF MESH N/A 02/11/2015   Procedure: INSERTION OF MESH;  Surgeon: Ralene Ok, MD;  Location: [facility];  Service: General;  Laterality: N/A;   KIDNEY STONE SURGERY  2008   TUBAL LIGATION     UMBILICAL HERNIA REPAIR N/A 02/11/2015   Procedure: LAPAROSCOPIC UMBILICAL HERNIA REPAIR WITH MESH;  Surgeon: Ralene Ok, MD;  Location: [facility];  Service: General;  Laterality: N/A;    Current Medications: Current Meds  Medication Sig   albuterol (VENTOLIN HFA) 108 (90 Base) MCG/ACT inhaler Inhale 2 puffs into the lungs every 6 (six) hours as needed for shortness of breath.   ALPRAZolam (XANAX) 0.25 MG tablet Take 1 tablet (0.25 mg total) by mouth 3 (three) times daily as needed. for anxiety   DULoxetine (CYMBALTA) 30 MG capsule Take 1 capsule (30 mg total) by mouth 2 (two) times daily.   furosemide (LASIX) 20 MG tablet Take 1 tablet (20 mg total) by mouth every other day.   levonorgestrel (MIRENA, 52 MG,) 20 MCG/24HR IUD Mirena 20 mcg/24 hours (6 yrs) 52 mg intrauterine device  Take 1  device by intrauterine route.   lisinopril-hydrochlorothiazide (ZESTORETIC) 20-12.5 MG tablet Take 1 tablet by mouth daily.   metoprolol succinate (TOPROL XL) 25 MG 24 hr tablet Take 0.5 tablets (12.5 mg total) by mouth daily.   nitroGLYCERIN (NITROSTAT) 0.4 MG SL tablet Place 1 tablet (0.4 mg total) under the tongue every 5 (five) minutes as needed.   ondansetron (ZOFRAN ODT) 4 MG disintegrating tablet Take 1 tablet (4 mg total) by mouth every 8 (eight) hours as needed for nausea or vomiting.   phenazopyridine (PYRIDIUM) 200 MG tablet Take 1 tablet (200 mg total) by mouth 3 (three) times daily.   potassium chloride (KLOR-CON) 10 MEQ tablet Take 1 tablet (10  mEq total) by mouth every other day.   rosuvastatin (CRESTOR) 10 MG tablet Take 1 tablet (10 mg total) by mouth daily.     Allergies:   Amoxicillin and Sulfa antibiotics   Social History   Socioeconomic History   Marital status: Married    Spouse name: Not on file   Number of children: 3   Years of education: Not on file   Highest education level: Not on file  Occupational History   Occupation: Owns Engineer, site  Tobacco Use   Smoking status: Never   Smokeless tobacco: Never  Substance and Sexual Activity   Alcohol use: No   Drug use: No   Sexual activity: Yes    Partners: Male  Other Topics Concern   Not on file  Social History Narrative   Gets reg exercise   Social Determinants of Health   Financial Resource Strain: Not on file  Food Insecurity: Not on file  Transportation Needs: Not on file  Physical Activity: Not on file  Stress: Not on file  Social Connections: Not on file     Family History: The patient's family history includes Alzheimer's disease in her maternal grandfather; Breast cancer in her mother; Hyperlipidemia in an other family member; Hypertension in an other family member; Kidney disease in an other family member; Lung cancer in her paternal grandmother; Stroke in her father. There is no history of Colon cancer, Rectal cancer, Esophageal cancer, or Liver cancer.  ROS:   Review of Systems  Constitution: Negative for decreased appetite, fever and weight gain.  HENT: Negative for congestion, ear discharge, hoarse voice and sore throat.   Eyes: Negative for discharge, redness, vision loss in right eye and visual halos.  Cardiovascular: Reported leg swelling and shortness of breath.  Negative for chest pain, orthopnea and palpitations.  Respiratory: Negative for cough, hemoptysis, shortness of breath and snoring.   Endocrine: Negative for heat intolerance and polyphagia.  Hematologic/Lymphatic: Negative for bleeding problem. Does not bruise/bleed easily.   Skin: Negative for flushing, nail changes, rash and suspicious lesions.  Musculoskeletal: Negative for arthritis, joint pain, muscle cramps, myalgias, neck pain and stiffness.  Gastrointestinal: Negative for abdominal pain, bowel incontinence, diarrhea and excessive appetite.  Genitourinary: Negative for decreased libido, genital sores and incomplete emptying.  Neurological: Negative for brief paralysis, focal weakness, headaches and loss of balance.  Psychiatric/Behavioral: Negative for altered mental status, depression and suicidal ideas.  Allergic/Immunologic: Negative for HIV exposure and persistent infections.    EKGs/Labs/Other Studies Reviewed:    The following studies were reviewed today:   EKG:  The ekg ordered today demonstrates sinus rhythm, heart rate 61 bpm compared to prior EKG no significant change.   Transthoracic echocardiogram 2/29/2021 IMPRESSIONS   1. Left ventricular ejection fraction, by estimation, is 55 to 60%. The  left ventricle has normal function. The left ventricle has no regional  wall motion abnormalities. Left ventricular diastolic parameters are  indeterminate. The average left  ventricular global longitudinal strain is -17.8 %. The global longitudinal  strain is normal.   2. Right ventricular systolic function is normal. The right ventricular  size is normal. There is normal pulmonary artery systolic pressure.   3. Left atrial size was mildly dilated.   4. The mitral valve is normal in structure. Trivial mitral valve  regurgitation. No evidence of mitral stenosis.   5. The aortic valve is tricuspid. Aortic valve regurgitation is not  visualized. No aortic stenosis is present.   6. The inferior vena cava is normal in size with greater than 50%  respiratory variability, suggesting right atrial pressure of 3 mmHg.  FINDINGS   Left Ventricle: Left ventricular ejection fraction, by estimation, is 55  to 60%. The left ventricle has normal function.  The left ventricle has no  regional wall motion abnormalities. The average left ventricular global  longitudinal strain is -17.8 %.  The global longitudinal strain is normal. Global longitudinal strain  performed but not reported based on interpreter judgement due to  suboptimal tracking. The left ventricular internal cavity size was normal  in size. There is no left ventricular  hypertrophy. Left ventricular diastolic parameters are indeterminate.   Right Ventricle: The right ventricular size is normal. No increase in  right ventricular wall thickness. Right ventricular systolic function is  normal. There is normal pulmonary artery systolic pressure. The tricuspid  regurgitant velocity is 1.36 m/s, and   with an assumed right atrial pressure of 3 mmHg, the estimated right  ventricular systolic pressure is [id number] mmHg.   Left Atrium: Left atrial size was mildly dilated.   Right Atrium: Right atrial size was normal in size.   Pericardium: There is no evidence of pericardial effusion.   Mitral Valve: The mitral valve is normal in structure. Normal mobility of  the mitral valve leaflets. Trivial mitral valve regurgitation. No evidence  of mitral valve stenosis.   Tricuspid Valve: The tricuspid valve is normal in structure. Tricuspid  valve regurgitation is trivial. No evidence of tricuspid stenosis.   Aortic Valve: The aortic valve is tricuspid. Aortic valve regurgitation is  not visualized. No aortic stenosis is present.   Pulmonic Valve: The pulmonic valve was normal in structure. Pulmonic valve  regurgitation is not visualized. No evidence of pulmonic stenosis.   Aorta: The aortic root is normal in size and structure.   Venous: The inferior vena cava is normal in size with greater than 50%  respiratory variability, suggesting right atrial pressure of 3 mmHg.   IAS/Shunts: No atrial level shunt detected by color flow Doppler.   The patient wore the monitor for 30 days 5 hours  starting September 28, 2019. Indication: Palpitations.   The minimum heart rate was 49 bpm, maximum heart rate was 169 bpm, and average heart rate was 80 bpm. Predominant underlying rhythm was Sinus Rhythm.   4 Supraventricular Tachycardia runs occurred, the run with the fastest interval lasting 8 beats with a maximum rate of 169 bpm, the longest lasting 13 beats with an average rate of 130 bpm.     Premature atrial complexes were rare (<1.0%). Premature Ventricular complexes were rare (<1.0%).   No ventricular tachycardia, no pauses, No AV block and no atrial fibrillation present. 16 triggered events 3 associated with premature atrial complex, no remaining associated with sinus tachycardia and sinus rhythm.  5 diary events are associated with sinus rhythm.   Conclusion: This study is remarkable for paroxysmal ventricular tachycardia, which is likely atrial tachycardia with AV block.       Recent Labs: 03/07/2020: TSH 0.64 03/16/2020: ALT 20; BUN 13; Creatinine, Ser 0.61; Hemoglobin 15.2; Platelets 235; Potassium 4.0; Sodium 139  Recent Lipid Panel    Component Value Date/Time   CHOL 212 (H) 03/07/2020 1434   TRIG 101.0 03/07/2020 1434   HDL 58.80 03/07/2020 1434   CHOLHDL 4 03/07/2020 1434   VLDL 20.2 03/07/2020 1434   LDLCALC 133 (H) 03/07/2020 1434    Physical Exam:    VS:  BP 126/88 (BP Location: Right Arm, Patient Position: Sitting)   Pulse 61   Ht '5\' 8"'$  (1.727 m)   Wt 205 lb 6.4 oz (93.2 kg)   SpO2 97%   BMI 31.23 kg/m     Wt Readings from Last 3 Encounters:  09/23/20 205 lb 6.4 oz (93.2 kg)  03/13/20 196 lb (88.9 kg)  03/07/20 198 lb 9.6 oz (90.1 kg)     GEN: Well nourished, well developed in no acute distress HEENT: Normal NECK: No JVD; No carotid bruits LYMPHATICS: No lymphadenopathy CARDIAC: S1S2 noted,RRR, no murmurs, rubs, gallops RESPIRATORY:  Clear to auscultation without rales, wheezing or rhonchi  ABDOMEN: Soft, non-tender, non-distended, +bowel  sounds, no guarding. EXTREMITIES: No edema, No cyanosis, no clubbing MUSCULOSKELETAL:  No deformity  SKIN: Warm and dry NEUROLOGIC:  Alert and oriented x 3, non-focal PSYCHIATRIC:  Normal affect, good insight  ASSESSMENT:    1. Essential hypertension   2. PAT (paroxysmal atrial tachycardia) (HCC)   3. Obesity (BMI 30-39.9)   4. Mixed hyperlipidemia    PLAN:     1.  She does have bilateral leg swelling.  And with her report of intermittent shortness of breath I like to start the patient on cautious diuretics Lasix 20 mg every other day to see if this is going to help.  And potassium supplement.  She has not gotten her sleep study done she tells me that she is also insurance and will not be able to get this testing done anytime soon.  The patient understands the need to lose weight with diet and exercise. We have discussed specific strategies for this. Blood work to be done today to assess BMP and mag in the setting of addition of loop diuretic to her already taking thiazide diuretics. Hyperlipidemia - continue with current statin medication.  The patient is in agreement with the above plan. The patient left the office in stable condition.  The patient will follow up in 12 weeks.   Medication Adjustments/Labs and Tests Ordered: Current medicines are reviewed at length with the patient today.  Concerns regarding medicines are outlined above.  Orders Placed This Encounter  Procedures   EKG 12-Lead   Meds ordered this encounter  Medications   furosemide (LASIX) 20 MG tablet    Sig: Take 1 tablet (20 mg total) by mouth every other day.    Dispense:  45 tablet    Refill:  3   potassium chloride (KLOR-CON) 10 MEQ tablet    Sig: Take 1 tablet (10 mEq total) by mouth every other day.    Dispense:  45 tablet    Refill:  3    There are no Patient Instructions on file for this visit.   Adopting a Healthy Lifestyle.  Know what a healthy weight is for you (roughly BMI <25) and aim to  maintain this   Aim for 7+ servings of fruits and vegetables daily   65-80+ fluid ounces of water or unsweet tea for healthy kidneys   Limit to max 1 drink of alcohol per day; avoid smoking/tobacco   Limit animal fats in diet for cholesterol and heart health - choose grass fed whenever available   Avoid highly processed foods, and foods high in saturated/trans fats   Aim for low stress - take time to unwind and care for your mental health   Aim for 150 min of moderate intensity exercise weekly for heart health, and weights twice weekly for bone health   Aim for 7-9 hours of sleep daily   When it comes to diets, agreement about the perfect plan isnt easy to find, even among the experts. Experts at the [employer] developed an idea known as the Healthy Eating Plate. Just imagine a plate divided into logical, healthy portions.   The emphasis is on diet quality:   Load up on vegetables and fruits - one-half of your plate: Aim for color and variety, and remember that potatoes dont count.   Go for whole grains - one-quarter of your plate: Whole wheat, barley, wheat berries, quinoa, oats, brown rice, and foods made with them. If you want pasta, go with whole wheat pasta.   Protein power - one-quarter of your plate: Fish, chicken, beans, and nuts are all healthy, versatile protein sources. Limit red meat.   The diet, however, does go beyond the plate, offering a few other suggestions.   Use healthy plant oils, such as olive, canola, soy, corn, sunflower and peanut. Check the labels, and avoid partially hydrogenated oil, which have unhealthy trans fats.   If youre thirsty, drink water. Coffee and tea are good in moderation, but skip sugary drinks and limit milk and dairy products to one or two daily servings.   The type of carbohydrate in the diet is more important than the amount. Some sources of carbohydrates, such as vegetables, fruits, whole grains, and beans-are  healthier than others.   Finally, stay active  Signed, Berniece Salines, DO  09/23/2020 1:51 PM    [facility]

## 2020-09-23 NOTE — Patient Instructions (Signed)
Medication Instructions:  Your physician has recommended you make the following change in your medication:  Start Lasix 20 mg once every other day Start Potassium 10 Meq once every other day along with the Lasix  *If you need a refill on your cardiac medications before your next appointment, please call your pharmacy*   Lab Work: Your physician recommends that you return for lab work in: Today for Basic Metabolic Panel and Magnesium  If you have labs (blood work) drawn today and your tests are completely normal, you will receive your results only by: MyChart Message (if you have MyChart) OR A paper copy in the mail If you have any lab test that is abnormal or we need to change your treatment, we will call you to review the results.   Testing/Procedures: EKG   Follow-Up: At [facility], you and your health needs are our priority.  As part of our continuing mission to provide you with exceptional heart care, we have created designated Provider Care Teams.  These Care Teams include your primary Cardiologist (physician) and Advanced Practice Providers (APPs -  Physician Assistants and Nurse Practitioners) who all work together to provide you with the care you need, when you need it.  We recommend signing up for the patient portal called "MyChart".  Sign up information is provided on this After Visit Summary.  MyChart is used to connect with patients for Virtual Visits (Telemedicine).  Patients are able to view lab/test results, encounter notes, upcoming appointments, etc.  Non-urgent messages can be sent to your provider as well.   To learn more about what you can do with MyChart, go to [email or web address].    Your next appointment:   12 week(s)  The format for your next appointment:   In Person  Provider:   Dr. Debbe Mounts Tobb @ [facility]   Other Instructions

## 2020-09-24 LAB — BASIC METABOLIC PANEL
BUN/Creatinine Ratio: 24 — ABNORMAL HIGH (ref 9–23)
BUN: 13 mg/dL (ref 6–24)
CO2: 24 mmol/L (ref 20–29)
Calcium: 9.7 mg/dL (ref 8.7–10.2)
Chloride: 103 mmol/L (ref 96–106)
Creatinine, Ser: 0.54 mg/dL — ABNORMAL LOW (ref 0.57–1.00)
Glucose: 76 mg/dL (ref 65–99)
Potassium: 4.2 mmol/L (ref 3.5–5.2)
Sodium: 140 mmol/L (ref 134–144)
eGFR: 113 mL/min/{1.73_m2} (ref >59)

## 2020-09-24 LAB — MAGNESIUM: Magnesium: 1.8 mg/dL (ref 1.6–2.3)

## 2020-09-25 ENCOUNTER — Telehealth: Payer: Self-pay

## 2020-09-25 ENCOUNTER — Other Ambulatory Visit: Payer: Self-pay | Admitting: Cardiology

## 2020-09-25 NOTE — Telephone Encounter (Signed)
Patient notified of results.

## 2020-09-25 NOTE — Telephone Encounter (Signed)
-----   Message from Berniece Salines, DO sent at 09/25/2020  2:20 PM EDT ----- Labs stable

## 2020-10-25 ENCOUNTER — Other Ambulatory Visit: Payer: Self-pay | Admitting: Family Medicine

## 2020-10-25 DIAGNOSIS — F419 Anxiety disorder, unspecified: Secondary | ICD-10-CM

## 2020-12-16 ENCOUNTER — Ambulatory Visit: Payer: Self-pay | Admitting: Cardiology

## 2020-12-26 ENCOUNTER — Telehealth: Payer: Self-pay | Admitting: *Deleted

## 2020-12-26 NOTE — Telephone Encounter (Signed)
-----   Message from Jacinta Shoe, [place] sent at 12/25/2020 11:23 AM EDT ----- Regarding: Sleep Study Pt needs to be schedule for a sleep study per Dr. Harriet Masson. Please advise

## 2020-12-26 NOTE — Telephone Encounter (Signed)
Spoke to patient and she has declined to do the sleep study.

## 2021-04-11 ENCOUNTER — Other Ambulatory Visit: Payer: Self-pay | Admitting: Family Medicine

## 2021-04-11 DIAGNOSIS — F419 Anxiety disorder, unspecified: Secondary | ICD-10-CM

## 2021-04-11 MED ORDER — ALPRAZOLAM 0.25 MG PO TABS: 0.2500 mg | ORAL_TABLET | Freq: Three times a day (TID) | ORAL | 0 refills | Status: DC | PRN

## 2021-04-11 NOTE — Addendum Note (Signed)
Addended by: Jeronimo Greaves on: 04/11/2021 11:07 AM   Modules accepted: Orders

## 2021-04-11 NOTE — Telephone Encounter (Signed)
**  Pt states going out of town**  Requesting: Xanax Contract: N/A UDS: N/A Last OV: 03/07/2020 Next OV: N/A Last Refill: 02/13/2019, #30--0 RF Database:   Please advise

## 2021-04-11 NOTE — Telephone Encounter (Signed)
Pt is going out of town, and did not realize she was out of her medication. Please advise.   Medication: ALPRAZolam (XANAX) 0.25 MG tablet   Has the patient contacted their pharmacy? No.   Preferred Pharmacy : [place], [place] - [address]  [address] [place], [place] [address]  Phone:  [phone]  Fax:  [phone]

## 2021-04-11 NOTE — Telephone Encounter (Signed)
Patient called back in regards to medication being refilled before vacation     Requesting: xanax Contract:N/A UDS:N/A Last Visit:03/07/20 Next Visit:N/A Last Refill:02/13/2019  Please Advise

## 2021-04-11 NOTE — Telephone Encounter (Signed)
Request sent to [facility] through Rx request

## 2021-08-14 ENCOUNTER — Ambulatory Visit: Payer: 59 | Admitting: Physician Assistant

## 2021-08-14 ENCOUNTER — Encounter: Payer: Self-pay | Admitting: Physician Assistant

## 2021-08-14 DIAGNOSIS — B079 Viral wart, unspecified: Secondary | ICD-10-CM | POA: Diagnosis not present

## 2021-08-14 DIAGNOSIS — L720 Epidermal cyst: Secondary | ICD-10-CM

## 2021-08-22 ENCOUNTER — Other Ambulatory Visit: Payer: Self-pay | Admitting: Family Medicine

## 2021-08-22 DIAGNOSIS — F419 Anxiety disorder, unspecified: Secondary | ICD-10-CM

## 2021-08-28 ENCOUNTER — Encounter: Payer: Self-pay | Admitting: Physician Assistant

## 2021-08-28 NOTE — Progress Notes (Signed)
   Follow-Up Visit   Subjective  Tina Frost is a 51 y.o. female who presents for the following: Skin Problem (Patient here today with her son for plantar wart on her right foot x 4-5 months, no pain. Per patient she's tried OTC no improvement. Patient states she also has a lesion on her scalp x years that's now sensitive and bothering her, no drainage.)   The following portions of the chart were reviewed this encounter and updated as appropriate:  Tobacco  Allergies  Meds  Problems  Med Hx  Surg Hx  Fam Hx      Objective  Well appearing patient in no apparent distress; mood and affect are within normal limits.  A full examination was performed including scalp, head, eyes, ears, nose, lips, neck, chest, axillae, abdomen, back, buttocks, bilateral upper extremities, bilateral lower extremities, hands, feet, fingers, toes, fingernails, and toenails. All findings within normal limits unless otherwise noted below.  Right Plantar Surface of Heel Verrucous papules -- Discussed viral etiology and contagion.   Mid Parietal Scalp Rubbery nodule   Assessment & Plan  Viral warts, unspecified type Right Plantar Surface of Heel  Home DCA  Epidermal cyst Mid Parietal Scalp  30 min surgery if pt wants removed    I, Koleson Reifsteck, PA-C, have reviewed all documentation's for this visit.  The documentation on 08/28/21 for the exam, diagnosis, procedures and orders are all accurate and complete.

## 2021-09-18 ENCOUNTER — Other Ambulatory Visit: Payer: Self-pay | Admitting: Family Medicine

## 2021-09-18 DIAGNOSIS — I10 Essential (primary) hypertension: Secondary | ICD-10-CM

## 2021-09-22 ENCOUNTER — Ambulatory Visit: Payer: 59 | Admitting: Family Medicine

## 2021-09-22 NOTE — Progress Notes (Shared)
Subjective:   By signing my name below, I, Tina Frost, attest that this documentation has been prepared under the direction and in the presence of Tina Frost 09/22/2021   Patient ID: Tina Frost, female    DOB: 06-Oct-1970, 51 y.o.   MRN: [id number]  No chief complaint on file.   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   Daytime somnolence 10/04/2019   Depression    DEPRESSION 04/27/2007   Qualifier: Diagnosis of  By: Jerold Coombe     Depression, major, single episode, mild ([place]) 09/10/2019   Dizzy 08/10/2019   Elevated hemoglobin ([place]) 08/10/2019   Essential hypertension 08/10/2019   External hemorrhoid 09/30/2016   History of iron deficiency 04/02/2017   History of kidney stones    Hyperlipidemia    Lower extremity edema 04/02/2017   Mixed hyperlipidemia 10/04/2019   NEPHROLITHIASIS, HX OF 08/29/2009   Qualifier: Diagnosis of  By: Jerold Coombe     Obesity (BMI 30-39.9) 04/24/2013   Os trigonum 03/16/2019   Other fatigue 04/02/2017   Overweight (BMI 25.0-29.9) 04/02/2017   Palpitations 10/04/2019   Peroneal tendinitis, right 03/16/2019   Rectal bleeding 09/30/2016   SOB (shortness of breath) 08/10/2019   WEIGHT GAIN 04/27/2007   Qualifier: Diagnosis of  By: Jerold Coombe      Past Surgical History:  Procedure Laterality Date   APPENDECTOMY     COLONOSCOPY  2002   HERNIA REPAIR  01/2015   INSERTION OF MESH N/A 02/11/2015   Procedure: INSERTION OF MESH;  Surgeon: Ralene Ok, MD;  Location: [facility];  Service: General;  Laterality: N/A;   KIDNEY STONE SURGERY  2008   TUBAL LIGATION     UMBILICAL HERNIA REPAIR N/A 02/11/2015   Procedure: LAPAROSCOPIC UMBILICAL HERNIA REPAIR WITH MESH;  Surgeon: Ralene Ok, MD;  Location: [facility];  Service: General;   Laterality: N/A;    Family History  Problem Relation Age of Onset   Breast cancer Mother    Lung cancer Paternal Grandmother    Stroke Father    Hypertension Other    Hyperlipidemia Other    Kidney disease Other        kidney stones   Alzheimer's disease Maternal Grandfather    Colon cancer Neg Hx    Rectal cancer Neg Hx    Esophageal cancer Neg Hx    Liver cancer Neg Hx     Social History   Socioeconomic History   Marital status: Married    Spouse name: Not on file   Number of children: 3   Years of education: Not on file   Highest education level: Not on file  Occupational History   Occupation: Owns [employer]  Tobacco Use   Smoking status: Never   Smokeless tobacco: Never  Substance and Sexual Activity   Alcohol use: No   Drug use: No   Sexual activity: Yes    Partners: Male  Other Topics Concern   Not on file  Social History Narrative   Gets reg exercise   Social Determinants of Health   Financial Resource Strain: Not on file  Food Insecurity: Not on file  Transportation Needs: Not on file  Physical Activity: Not on file  Stress: Not on file  Social Connections: Not on file  Intimate Partner Violence: Not on file    Outpatient Medications Prior to Visit  Medication Sig Dispense Refill   albuterol (VENTOLIN HFA) 108 (90 Base) MCG/ACT inhaler Inhale 2 puffs into the lungs every 6 (six) hours as needed for shortness of breath. 18 g 0   ALPRAZolam (XANAX) 0.25 MG tablet Take 1 tablet (0.25 mg total) by mouth 3 (three) times daily as needed. for anxiety 30 tablet 0   BD PEN NEEDLE NANO U/F 32G X 4 MM MISC Inject into the skin as directed.     buPROPion (WELLBUTRIN XL) 150 MG 24 hr tablet Take 150 mg by mouth every morning.     DULoxetine (CYMBALTA) 30 MG capsule TAKE ONE CAPSULE BY MOUTH TWICE DAILY 60 capsule 3   levonorgestrel (MIRENA, 52 MG,) 20 MCG/24HR IUD Mirena 20 mcg/24 hours (6 yrs) 52 mg intrauterine device  Take 1 device by intrauterine route.      lisinopril-hydrochlorothiazide (ZESTORETIC) 20-12.5 MG tablet Take 1 tablet by mouth daily. 90 tablet 0   metoprolol succinate (TOPROL-XL) 25 MG 24 hr tablet TAKE 1/2 TABLET BY MOUTH DAILY 45 tablet 3   nitroGLYCERIN (NITROSTAT) 0.4 MG SL tablet Place 1 tablet (0.4 mg total) under the tongue every 5 (five) minutes as needed. 25 tablet 11   ondansetron (ZOFRAN ODT) 4 MG disintegrating tablet Take 1 tablet (4 mg total) by mouth every 8 (eight) hours as needed for nausea or vomiting. 20 tablet 0   pantoprazole (PROTONIX) 40 MG tablet Take 40 mg by mouth 2 (two) times daily.     phenazopyridine (PYRIDIUM) 200 MG tablet Take 1 tablet (200 mg total) by mouth 3 (three) times daily. 6 tablet 0   potassium chloride (KLOR-CON) 10 MEQ tablet Take 1 tablet (10 mEq total) by mouth every other day. 45 tablet 3   progesterone (PROMETRIUM) 100 MG capsule Take 300 mg by mouth at bedtime.     rosuvastatin (CRESTOR) 10 MG tablet Take 1 tablet (10 mg total) by mouth daily. 90 tablet 3   No facility-administered medications prior to visit.    Allergies  Allergen Reactions   Amoxicillin Other (See Comments)    Causes kidney stones  Causes kidney stones   Sulfa Antibiotics Other (See Comments)    Causes Kidney stones Causes Kidney stones    ROS     Objective:    Physical Exam Constitutional:      General: She is not in acute distress.    Appearance: Normal appearance. She is not ill-appearing.  HENT:     Head: Normocephalic and atraumatic.     Right Ear: External ear normal.     Left Ear: External ear normal.  Eyes:     Extraocular Movements: Extraocular movements intact.     Pupils: Pupils are equal, round, and reactive to light.  Cardiovascular:     Rate and Rhythm: Normal rate and regular rhythm.     Heart sounds: Normal heart sounds. No murmur heard.    No gallop.  Pulmonary:     Effort: Pulmonary effort is normal. No respiratory distress.     Breath sounds: Normal breath sounds. No  wheezing or rales.  Skin:    General: Skin is warm and dry.  Neurological:     Mental Status: She is alert and oriented to person, place, and time.  Psychiatric:        Judgment: Judgment normal.     There were  no vitals taken for this visit. Wt Readings from Last 3 Encounters:  09/23/20 205 lb 6.4 oz (93.2 kg)  03/13/20 196 lb (88.9 kg)  03/07/20 198 lb 9.6 oz (90.1 kg)    Diabetic Foot Exam - Simple   No data filed    Lab Results  Component Value Date   WBC 11.6 (H) 03/16/2020   HGB 15.2 (H) 03/16/2020   HCT 43.0 03/16/2020   PLT 235 03/16/2020   GLUCOSE 76 09/23/2020   CHOL 212 (H) 03/07/2020   TRIG 101.0 03/07/2020   HDL 58.80 03/07/2020   LDLCALC 133 (H) 03/07/2020   ALT 20 03/16/2020   AST 16 03/16/2020   NA 140 09/23/2020   K 4.2 09/23/2020   CL 103 09/23/2020   CREATININE 0.54 (L) 09/23/2020   BUN 13 09/23/2020   CO2 24 09/23/2020   TSH 0.64 03/07/2020    Lab Results  Component Value Date   TSH 0.64 03/07/2020   Lab Results  Component Value Date   WBC 11.6 (H) 03/16/2020   HGB 15.2 (H) 03/16/2020   HCT 43.0 03/16/2020   MCV 90.1 03/16/2020   PLT 235 03/16/2020   Lab Results  Component Value Date   NA 140 09/23/2020   K 4.2 09/23/2020   CO2 24 09/23/2020   GLUCOSE 76 09/23/2020   BUN 13 09/23/2020   CREATININE 0.54 (L) 09/23/2020   BILITOT 1.3 (H) 03/16/2020   ALKPHOS 69 03/16/2020   AST 16 03/16/2020   ALT 20 03/16/2020   PROT 6.8 03/16/2020   ALBUMIN 4.0 03/16/2020   CALCIUM 9.7 09/23/2020   ANIONGAP 11 03/16/2020   EGFR 113 09/23/2020   GFR 103.41 03/07/2020   Lab Results  Component Value Date   CHOL 212 (H) 03/07/2020   Lab Results  Component Value Date   HDL 58.80 03/07/2020   Lab Results  Component Value Date   LDLCALC 133 (H) 03/07/2020   Lab Results  Component Value Date   TRIG 101.0 03/07/2020   Lab Results  Component Value Date   CHOLHDL 4 03/07/2020   No results found for: "HGBA1C"     Assessment &  Plan:   Problem List Items Addressed This Visit   None   No orders of the defined types were placed in this encounter.   I, Tina Frost, personally preformed the services described in this documentation.  All medical record entries made by the scribe were at my direction and in my presence.  I have reviewed the chart and discharge instructions (if applicable) and agree that the record reflects my personal performance and is accurate and complete. 09/22/2021   I,Amber Collins,acting as a scribe for [employer], Frost.,have documented all relevant documentation on the behalf of Tina Held, Frost,as directed by  Tina Held, Frost while in the presence of Tina Held, Frost.    [employer]

## 2021-09-25 ENCOUNTER — Other Ambulatory Visit: Payer: Self-pay | Admitting: Family Medicine

## 2021-09-25 DIAGNOSIS — F419 Anxiety disorder, unspecified: Secondary | ICD-10-CM

## 2021-10-09 ENCOUNTER — Encounter: Payer: Self-pay | Admitting: Family Medicine

## 2021-10-09 ENCOUNTER — Ambulatory Visit: Payer: 59 | Admitting: Family Medicine

## 2021-10-09 VITALS — BP 100/80 | HR 73 | Temp 98.4°F | Resp 18 | Ht 68.0 in | Wt 168.4 lb

## 2021-10-09 DIAGNOSIS — Z1159 Encounter for screening for other viral diseases: Secondary | ICD-10-CM

## 2021-10-09 DIAGNOSIS — I10 Essential (primary) hypertension: Secondary | ICD-10-CM

## 2021-10-09 DIAGNOSIS — E785 Hyperlipidemia, unspecified: Secondary | ICD-10-CM

## 2021-10-09 DIAGNOSIS — F419 Anxiety disorder, unspecified: Secondary | ICD-10-CM | POA: Diagnosis not present

## 2021-10-09 DIAGNOSIS — Z1211 Encounter for screening for malignant neoplasm of colon: Secondary | ICD-10-CM | POA: Diagnosis not present

## 2021-10-09 DIAGNOSIS — L723 Sebaceous cyst: Secondary | ICD-10-CM

## 2021-10-09 LAB — CBC WITH DIFFERENTIAL/PLATELET
Basophils Absolute: 0.1 10*3/uL (ref 0.0–0.1)
Basophils Relative: 1.1 % (ref 0.0–3.0)
Eosinophils Absolute: 0.2 10*3/uL (ref 0.0–0.7)
Eosinophils Relative: 3.6 % (ref 0.0–5.0)
HCT: 40.1 % (ref 36.0–46.0)
Hemoglobin: 13.7 g/dL (ref 12.0–15.0)
Lymphocytes Relative: 25.3 % (ref 12.0–46.0)
Lymphs Abs: 1.4 10*3/uL (ref 0.7–4.0)
MCHC: 34.1 g/dL (ref 30.0–36.0)
MCV: 93 fl (ref 78.0–100.0)
Monocytes Absolute: 0.4 10*3/uL (ref 0.1–1.0)
Monocytes Relative: 7 % (ref 3.0–12.0)
Neutro Abs: 3.5 10*3/uL (ref 1.4–7.7)
Neutrophils Relative %: 63 % (ref 43.0–77.0)
Platelets: 225 10*3/uL (ref 150.0–400.0)
RBC: 4.31 Mil/uL (ref 3.87–5.11)
RDW: 13.3 % (ref 11.5–15.5)
WBC: 5.6 10*3/uL (ref 4.0–10.5)

## 2021-10-09 LAB — COMPREHENSIVE METABOLIC PANEL
ALT: 59 U/L — ABNORMAL HIGH (ref 0–35)
AST: 31 U/L (ref 0–37)
Albumin: 4 g/dL (ref 3.5–5.2)
Alkaline Phosphatase: 67 U/L (ref 39–117)
BUN: 11 mg/dL (ref 6–23)
CO2: 27 mEq/L (ref 19–32)
Calcium: 9.1 mg/dL (ref 8.4–10.5)
Chloride: 104 mEq/L (ref 96–112)
Creatinine, Ser: 0.63 mg/dL (ref 0.40–1.20)
GFR: 104.16 mL/min (ref >60.00)
Glucose, Bld: 76 mg/dL (ref 70–99)
Potassium: 4 mEq/L (ref 3.5–5.1)
Sodium: 140 mEq/L (ref 135–145)
Total Bilirubin: 1.2 mg/dL (ref 0.2–1.2)
Total Protein: 6.1 g/dL (ref 6.0–8.3)

## 2021-10-09 LAB — LIPID PANEL
Cholesterol: 179 mg/dL (ref 0–200)
HDL: 45.7 mg/dL (ref >39.00)
LDL Cholesterol: 120 mg/dL — ABNORMAL HIGH (ref 0–99)
NonHDL: 133.23
Total CHOL/HDL Ratio: 4
Triglycerides: 66 mg/dL (ref 0.0–149.0)
VLDL: 13.2 mg/dL (ref 0.0–40.0)

## 2021-10-09 MED ORDER — ALPRAZOLAM 0.25 MG PO TABS: 0.2500 mg | ORAL_TABLET | Freq: Three times a day (TID) | ORAL | 0 refills | Status: DC | PRN

## 2021-10-09 MED ORDER — LISINOPRIL-HYDROCHLOROTHIAZIDE 20-12.5 MG PO TABS: 1.0000 | ORAL_TABLET | Freq: Every day | ORAL | 1 refills | Status: DC

## 2021-10-09 MED ORDER — DULOXETINE HCL 30 MG PO CPEP
30.0000 mg | ORAL_CAPSULE | Freq: Two times a day (BID) | ORAL | 3 refills | Status: DC
Start: 2021-10-09 — End: 2023-01-19

## 2021-10-09 NOTE — Assessment & Plan Note (Signed)
Encourage heart healthy diet such as MIND or DASH diet, increase exercise, avoid trans fats, simple carbohydrates and processed foods, consider a krill or fish or flaxseed oil cap daily.  °
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
## 2021-10-09 NOTE — Patient Instructions (Signed)

## 2021-10-09 NOTE — Assessment & Plan Note (Signed)
Well controlled, no changes to meds. Encouraged heart healthy diet such as the DASH diet and exercise as tolerated.  °

## 2021-10-10 LAB — HEPATITIS C ANTIBODY: Hepatitis C Ab: NONREACTIVE

## 2021-10-17 ENCOUNTER — Other Ambulatory Visit: Payer: Self-pay | Admitting: Family Medicine

## 2021-10-17 DIAGNOSIS — E785 Hyperlipidemia, unspecified: Secondary | ICD-10-CM

## 2021-12-22 ENCOUNTER — Other Ambulatory Visit: Payer: Self-pay | Admitting: Family Medicine

## 2021-12-22 DIAGNOSIS — F419 Anxiety disorder, unspecified: Secondary | ICD-10-CM

## 2021-12-22 MED ORDER — ALPRAZOLAM 0.25 MG PO TABS: 0.2500 mg | ORAL_TABLET | Freq: Three times a day (TID) | ORAL | 0 refills | Status: DC | PRN

## 2021-12-22 NOTE — Telephone Encounter (Signed)
Requesting: alprazolam 0.'25mg'$   Contract: None UDS: None Last Visit: 10/09/21 Next Visit: None Last Refill: 10/09/21 #30 and 0RF   Please Advise

## 2022-01-01 ENCOUNTER — Ambulatory Visit: Payer: No Typology Code available for payment source | Admitting: Cardiology

## 2022-01-13 ENCOUNTER — Telehealth: Payer: Self-pay | Admitting: Cardiology

## 2022-01-13 DIAGNOSIS — I10 Essential (primary) hypertension: Secondary | ICD-10-CM

## 2022-01-13 MED ORDER — LISINOPRIL-HYDROCHLOROTHIAZIDE 20-12.5 MG PO TABS: 1.0000 | ORAL_TABLET | Freq: Every day | ORAL | 1 refills | Status: DC

## 2022-01-13 NOTE — Telephone Encounter (Signed)
*  STAT* If patient is at the pharmacy, call can be transferred to refill team.   1. Which medications need to be refilled? (please list name of each medication and dose if known)   lisinopril-hydrochlorothiazide (ZESTORETIC) 20-12.5 MG tablet    2. Which pharmacy/location (including street and city if local pharmacy) is medication to be sent to? [place], [place] - [address]   3. Do they need a 30 day or 90 day supply? 90   Patient has appt on 11/29

## 2022-01-14 ENCOUNTER — Other Ambulatory Visit: Payer: Self-pay

## 2022-01-14 ENCOUNTER — Other Ambulatory Visit: Payer: Self-pay | Admitting: Cardiology

## 2022-01-14 MED ORDER — METOPROLOL SUCCINATE ER 25 MG PO TB24: 12.5000 mg | ORAL_TABLET | Freq: Every day | ORAL | 3 refills | Status: DC

## 2022-01-21 ENCOUNTER — Ambulatory Visit: Payer: No Typology Code available for payment source | Admitting: Cardiology

## 2022-03-03 ENCOUNTER — Ambulatory Visit: Payer: No Typology Code available for payment source | Attending: Cardiology | Admitting: Cardiology

## 2022-03-03 ENCOUNTER — Encounter: Payer: Self-pay | Admitting: Cardiology

## 2022-03-03 VITALS — BP 120/82 | HR 70 | Ht 68.0 in | Wt 176.6 lb

## 2022-03-03 DIAGNOSIS — E782 Mixed hyperlipidemia: Secondary | ICD-10-CM

## 2022-03-03 DIAGNOSIS — I10 Essential (primary) hypertension: Secondary | ICD-10-CM

## 2022-03-03 MED ORDER — METOPROLOL SUCCINATE ER 25 MG PO TB24: 12.5000 mg | ORAL_TABLET | Freq: Every day | ORAL | 3 refills | Status: DC

## 2022-03-03 MED ORDER — LISINOPRIL-HYDROCHLOROTHIAZIDE 20-12.5 MG PO TABS: 1.0000 | ORAL_TABLET | Freq: Every day | ORAL | 3 refills | Status: DC

## 2022-03-03 MED ORDER — ROSUVASTATIN CALCIUM 10 MG PO TABS: 10.0000 mg | ORAL_TABLET | Freq: Every day | ORAL | 3 refills | Status: DC

## 2022-03-03 MED ORDER — POTASSIUM CHLORIDE ER 10 MEQ PO TBCR: 10.0000 meq | EXTENDED_RELEASE_TABLET | ORAL | 3 refills | Status: AC

## 2022-03-03 NOTE — Patient Instructions (Signed)
Medication Instructions:  Your physician recommends that you continue on your current medications as directed. Please refer to the Current Medication list given to you today.  *If you need a refill on your cardiac medications before your next appointment, please call your pharmacy*   Lab Work: NONE If you have labs (blood work) drawn today and your tests are completely normal, you will receive your results only by: MyChart Message (if you have MyChart) OR A paper copy in the mail If you have any lab test that is abnormal or we need to change your treatment, we will call you to review the results.   Testing/Procedures: NONE   Follow-Up: At [facility], you and your health needs are our priority.  As part of our continuing mission to provide you with exceptional heart care, we have created designated Provider Care Teams.  These Care Teams include your primary Cardiologist (physician) and Advanced Practice Providers (APPs -  Physician Assistants and Nurse Practitioners) who all work together to provide you with the care you need, when you need it.  We recommend signing up for the patient portal called "MyChart".  Sign up information is provided on this After Visit Summary.  MyChart is used to connect with patients for Virtual Visits (Telemedicine).  Patients are able to view lab/test results, encounter notes, upcoming appointments, etc.  Non-urgent messages can be sent to your provider as well.   To learn more about what you can do with MyChart, go to [email or web address].    Your next appointment:   1 year(s)  The format for your next appointment:   In Person  Provider:   Kardie Tobb, DO   

## 2022-03-06 NOTE — Progress Notes (Signed)
Cardiology Office Note:    Date:  03/06/2022   ID:  Tina Frost, DOB 1971/02/21, MRN [id number]  PCP:  Carollee Herter, Alferd Apa, DO  Cardiologist:  Berniece Salines, DO  Electrophysiologist:  None   Referring MD: Carollee Herter, Alferd Apa, *   " I am having leg swelling and shortness of breath"   History of Present Illness:    Tina Frost is a 52 y.o. female with a hx of hypertension, hyperlipidemia paroxysmal atrial tachycardia on Toprol-XL, obesity is here today for follow-up visit due to significant leg swelling and intermittent shortness of breath.  I last saw the patient on March 13, 2018 at that time she was experiencing some intermittent shortness of breath which I suspected was likely in the setting of undiagnosed sleep apnea.  Today she tells me that she has been experiencing some bilateral leg edema as well as shortness of breath.  She notes that she has also gained some weight giving the leg swelling.  No chest pain no lightheadedness or fatigue.  At her visit on 09/23/2020 she was experiencing shortness of breath and leg swelling. I started her on cautious diuretic. She has responded well to this. No complaints today.   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   Daytime somnolence 10/04/2019   Depression    DEPRESSION 04/27/2007   Qualifier: Diagnosis of  By: Jerold Coombe     Depression, major, single episode, mild ([place]) 09/10/2019   Dizzy 08/10/2019   Elevated hemoglobin ([place]) 08/10/2019   Essential hypertension 08/10/2019   External hemorrhoid 09/30/2016   History of iron deficiency 04/02/2017   History of kidney stones    Hyperlipidemia    Lower extremity edema 04/02/2017   Mixed hyperlipidemia 10/04/2019   NEPHROLITHIASIS, HX OF 08/29/2009   Qualifier: Diagnosis of  By: Jerold Coombe     Obesity (BMI 30-39.9)  04/24/2013   Os trigonum 03/16/2019   Other fatigue 04/02/2017   Overweight (BMI 25.0-29.9) 04/02/2017   Palpitations 10/04/2019   Peroneal tendinitis, right 03/16/2019   Rectal bleeding 09/30/2016   SOB (shortness of breath) 08/10/2019   WEIGHT GAIN 04/27/2007   Qualifier: Diagnosis of  By: Jerold Coombe      Past Surgical History:  Procedure Laterality Date   APPENDECTOMY     COLONOSCOPY  2002   HERNIA REPAIR  01/2015   INSERTION OF MESH N/A 02/11/2015   Procedure: INSERTION OF MESH;  Surgeon: Ralene Ok, MD;  Location: [facility];  Service: General;  Laterality: N/A;   KIDNEY STONE SURGERY  2008   TUBAL LIGATION     UMBILICAL HERNIA REPAIR N/A 02/11/2015   Procedure: LAPAROSCOPIC UMBILICAL HERNIA REPAIR WITH MESH;  Surgeon: Ralene Ok, MD;  Location: [facility];  Service: General;  Laterality: N/A;    Current Medications: Current Meds  Medication Sig   albuterol (VENTOLIN HFA) 108 (90 Base) MCG/ACT inhaler Inhale 2 puffs into the lungs every 6 (six) hours as needed for shortness of breath.   ALPRAZolam (XANAX) 0.25 MG tablet Take 1 tablet (0.25 mg total) by mouth 3 (three) times daily as needed. for anxiety   BD PEN NEEDLE NANO U/F 32G X 4 MM MISC Inject into the skin as directed.   buPROPion (WELLBUTRIN XL) 150 MG 24 hr tablet Take 150  mg by mouth every morning.   DULoxetine (CYMBALTA) 30 MG capsule Take 1 capsule (30 mg total) by mouth 2 (two) times daily.   levonorgestrel (MIRENA, 52 MG,) 20 MCG/24HR IUD Mirena 20 mcg/24 hours (6 yrs) 52 mg intrauterine device  Take 1 device by intrauterine route.   ondansetron (ZOFRAN ODT) 4 MG disintegrating tablet Take 1 tablet (4 mg total) by mouth every 8 (eight) hours as needed for nausea or vomiting.   pantoprazole (PROTONIX) 40 MG tablet Take 40 mg by mouth 2 (two) times daily.   phenazopyridine (PYRIDIUM) 200 MG tablet Take 1 tablet (200 mg total) by mouth 3 (three) times daily.   progesterone (PROMETRIUM) 100 MG capsule Take 300 mg by mouth  at bedtime.   [DISCONTINUED] lisinopril-hydrochlorothiazide (ZESTORETIC) 20-12.5 MG tablet Take 1 tablet by mouth daily.   [DISCONTINUED] metoprolol succinate (TOPROL-XL) 25 MG 24 hr tablet TAKE 1/2 TABLET BY MOUTH DAILY     Allergies:   Amoxicillin and Sulfa antibiotics   Social History   Socioeconomic History   Marital status: Married    Spouse name: Not on file   Number of children: 3   Years of education: Not on file   Highest education level: Not on file  Occupational History   Occupation: Owns Engineer, site  Tobacco Use   Smoking status: Never   Smokeless tobacco: Never  Substance and Sexual Activity   Alcohol use: No   Drug use: No   Sexual activity: Yes    Partners: Male  Other Topics Concern   Not on file  Social History Narrative   Gets reg exercise   Social Determinants of Health   Financial Resource Strain: Not on file  Food Insecurity: Not on file  Transportation Needs: Not on file  Physical Activity: Not on file  Stress: Not on file  Social Connections: Not on file     Family History: The patient's family history includes Alzheimer's disease in her maternal grandfather; Breast cancer in her mother; Hyperlipidemia in an other family member; Hypertension in an other family member; Kidney disease in an other family member; Lung cancer in her paternal grandmother; Stroke in her father. There is no history of Colon cancer, Rectal cancer, Esophageal cancer, or Liver cancer.  ROS:   Review of Systems  Constitution: Negative for decreased appetite, fever and weight gain.  HENT: Negative for congestion, ear discharge, hoarse voice and sore throat.   Eyes: Negative for discharge, redness, vision loss in right eye and visual halos.  Cardiovascular: Reported leg swelling and shortness of breath.  Negative for chest pain, orthopnea and palpitations.  Respiratory: Negative for cough, hemoptysis, shortness of breath and snoring.   Endocrine: Negative for heat intolerance  and polyphagia.  Hematologic/Lymphatic: Negative for bleeding problem. Does not bruise/bleed easily.  Skin: Negative for flushing, nail changes, rash and suspicious lesions.  Musculoskeletal: Negative for arthritis, joint pain, muscle cramps, myalgias, neck pain and stiffness.  Gastrointestinal: Negative for abdominal pain, bowel incontinence, diarrhea and excessive appetite.  Genitourinary: Negative for decreased libido, genital sores and incomplete emptying.  Neurological: Negative for brief paralysis, focal weakness, headaches and loss of balance.  Psychiatric/Behavioral: Negative for altered mental status, depression and suicidal ideas.  Allergic/Immunologic: Negative for HIV exposure and persistent infections.    EKGs/Labs/Other Studies Reviewed:    The following studies were reviewed today:   EKG:  The ekg ordered today demonstrates sinus rhythm, heart rate 61 bpm compared to prior EKG no significant change.   Transthoracic echocardiogram 2/29/2021  IMPRESSIONS   1. Left ventricular ejection fraction, by estimation, is 55 to 60%. The  left ventricle has normal function. The left ventricle has no regional  wall motion abnormalities. Left ventricular diastolic parameters are  indeterminate. The average left  ventricular global longitudinal strain is -17.8 %. The global longitudinal  strain is normal.   2. Right ventricular systolic function is normal. The right ventricular  size is normal. There is normal pulmonary artery systolic pressure.   3. Left atrial size was mildly dilated.   4. The mitral valve is normal in structure. Trivial mitral valve  regurgitation. No evidence of mitral stenosis.   5. The aortic valve is tricuspid. Aortic valve regurgitation is not  visualized. No aortic stenosis is present.   6. The inferior vena cava is normal in size with greater than 50%  respiratory variability, suggesting right atrial pressure of 3 mmHg.  FINDINGS   Left Ventricle: Left  ventricular ejection fraction, by estimation, is 55  to 60%. The left ventricle has normal function. The left ventricle has no  regional wall motion abnormalities. The average left ventricular global  longitudinal strain is -17.8 %.  The global longitudinal strain is normal. Global longitudinal strain  performed but not reported based on interpreter judgement due to  suboptimal tracking. The left ventricular internal cavity size was normal  in size. There is no left ventricular  hypertrophy. Left ventricular diastolic parameters are indeterminate.   Right Ventricle: The right ventricular size is normal. No increase in  right ventricular wall thickness. Right ventricular systolic function is  normal. There is normal pulmonary artery systolic pressure. The tricuspid  regurgitant velocity is 1.36 m/s, and   with an assumed right atrial pressure of 3 mmHg, the estimated right  ventricular systolic pressure is 32.4 mmHg.   Left Atrium: Left atrial size was mildly dilated.   Right Atrium: Right atrial size was normal in size.   Pericardium: There is no evidence of pericardial effusion.   Mitral Valve: The mitral valve is normal in structure. Normal mobility of  the mitral valve leaflets. Trivial mitral valve regurgitation. No evidence  of mitral valve stenosis.   Tricuspid Valve: The tricuspid valve is normal in structure. Tricuspid  valve regurgitation is trivial. No evidence of tricuspid stenosis.   Aortic Valve: The aortic valve is tricuspid. Aortic valve regurgitation is  not visualized. No aortic stenosis is present.   Pulmonic Valve: The pulmonic valve was normal in structure. Pulmonic valve  regurgitation is not visualized. No evidence of pulmonic stenosis.   Aorta: The aortic root is normal in size and structure.   Venous: The inferior vena cava is normal in size with greater than 50%  respiratory variability, suggesting right atrial pressure of 3 mmHg.   IAS/Shunts: No  atrial level shunt detected by color flow Doppler.   The patient wore the monitor for 30 days 5 hours starting September 28, 2019. Indication: Palpitations.   The minimum heart rate was 49 bpm, maximum heart rate was 169 bpm, and average heart rate was 80 bpm. Predominant underlying rhythm was Sinus Rhythm.   4 Supraventricular Tachycardia runs occurred, the run with the fastest interval lasting 8 beats with a maximum rate of 169 bpm, the longest lasting 13 beats with an average rate of 130 bpm.     Premature atrial complexes were rare (<1.0%). Premature Ventricular complexes were rare (<1.0%).   No ventricular tachycardia, no pauses, No AV block and no atrial fibrillation present. 16 triggered events  3 associated with premature atrial complex, no remaining associated with sinus tachycardia and sinus rhythm.  5 diary events are associated with sinus rhythm.   Conclusion: This study is remarkable for paroxysmal ventricular tachycardia, which is likely atrial tachycardia with AV block.       Recent Labs: 10/09/2021: ALT 59; BUN 11; Creatinine, Ser 0.63; Hemoglobin 13.7; Platelets 225.0; Potassium 4.0; Sodium 140  Recent Lipid Panel    Component Value Date/Time   CHOL 179 10/09/2021 1012   TRIG 66.0 10/09/2021 1012   HDL 45.70 10/09/2021 1012   CHOLHDL 4 10/09/2021 1012   VLDL 13.2 10/09/2021 1012   LDLCALC 120 (H) 10/09/2021 1012    Physical Exam:    VS:  BP 120/82   Pulse 70   Ht '5\' 8"'$  (1.727 m)   Wt 80.1 kg   SpO2 99%   BMI 26.85 kg/m     Wt Readings from Last 3 Encounters:  03/03/22 80.1 kg  10/09/21 76.4 kg  09/23/20 93.2 kg     GEN: Well nourished, well developed in no acute distress HEENT: Normal NECK: No JVD; No carotid bruits LYMPHATICS: No lymphadenopathy CARDIAC: S1S2 noted,RRR, no murmurs, rubs, gallops RESPIRATORY:  Clear to auscultation without rales, wheezing or rhonchi  ABDOMEN: Soft, non-tender, non-distended, +bowel sounds, no guarding. EXTREMITIES:  No edema, No cyanosis, no clubbing MUSCULOSKELETAL:  No deformity  SKIN: Warm and dry NEUROLOGIC:  Alert and oriented x 3, non-focal PSYCHIATRIC:  Normal affect, good insight  ASSESSMENT:    1. Mixed hyperlipidemia   2. Essential hypertension    PLAN:    She is doing well from a CV standpoint - no medication  Hyperlipidemia - continue with current statin medication.  The patient is in agreement with the above plan. The patient left the office in stable condition.  The patient will follow up in 1year.  Medication Adjustments/Labs and Tests Ordered: Current medicines are reviewed at length with the patient today.  Concerns regarding medicines are outlined above.  Orders Placed This Encounter  Procedures   EKG 12-Lead   Meds ordered this encounter  Medications   lisinopril-hydrochlorothiazide (ZESTORETIC) 20-12.5 MG tablet    Sig: Take 1 tablet by mouth daily.    Dispense:  90 tablet    Refill:  3   metoprolol succinate (TOPROL-XL) 25 MG 24 hr tablet    Sig: Take 0.5 tablets (12.5 mg total) by mouth daily.    Dispense:  45 tablet    Refill:  3   potassium chloride (KLOR-CON) 10 MEQ tablet    Sig: Take 1 tablet (10 mEq total) by mouth every other day.    Dispense:  45 tablet    Refill:  3   rosuvastatin (CRESTOR) 10 MG tablet    Sig: Take 1 tablet (10 mg total) by mouth daily.    Dispense:  90 tablet    Refill:  3    Patient Instructions  Medication Instructions:  Your physician recommends that you continue on your current medications as directed. Please refer to the Current Medication list given to you today.  *If you need a refill on your cardiac medications before your next appointment, please call your pharmacy*   Lab Work: NONE If you have labs (blood work) drawn today and your tests are completely normal, you will receive your results only by: [place] (if you have MyChart) OR A paper copy in the mail If you have any lab test that is abnormal or we need  to change  your treatment, we will call you to review the results.   Testing/Procedures: NONE   Follow-Up: At [facility], you and your health needs are our priority.  As part of our continuing mission to provide you with exceptional heart care, we have created designated Provider Care Teams.  These Care Teams include your primary Cardiologist (physician) and Advanced Practice Providers (APPs -  Physician Assistants and Nurse Practitioners) who all work together to provide you with the care you need, when you need it.  We recommend signing up for the patient portal called "MyChart".  Sign up information is provided on this After Visit Summary.  MyChart is used to connect with patients for Virtual Visits (Telemedicine).  Patients are able to view lab/test results, encounter notes, upcoming appointments, etc.  Non-urgent messages can be sent to your provider as well.   To learn more about what you can do with MyChart, go to [email or web address].    Your next appointment:   1 year(s)  The format for your next appointment:   In Person  Provider:   Berniece Salines, DO     Adopting a Healthy Lifestyle.  Know what a healthy weight is for you (roughly BMI <25) and aim to maintain this   Aim for 7+ servings of fruits and vegetables daily   65-80+ fluid ounces of water or unsweet tea for healthy kidneys   Limit to max 1 drink of alcohol per day; avoid smoking/tobacco   Limit animal fats in diet for cholesterol and heart health - choose grass fed whenever available   Avoid highly processed foods, and foods high in saturated/trans fats   Aim for low stress - take time to unwind and care for your mental health   Aim for 150 min of moderate intensity exercise weekly for heart health, and weights twice weekly for bone health   Aim for 7-9 hours of sleep daily   When it comes to diets, agreement about the perfect plan isnt easy to find, even among the experts. Experts at the  [employer] developed an idea known as the Healthy Eating Plate. Just imagine a plate divided into logical, healthy portions.   The emphasis is on diet quality:   Load up on vegetables and fruits - one-half of your plate: Aim for color and variety, and remember that potatoes dont count.   Go for whole grains - one-quarter of your plate: Whole wheat, barley, wheat berries, quinoa, oats, brown rice, and foods made with them. If you want pasta, go with whole wheat pasta.   Protein power - one-quarter of your plate: Fish, chicken, beans, and nuts are all healthy, versatile protein sources. Limit red meat.   The diet, however, does go beyond the plate, offering a few other suggestions.   Use healthy plant oils, such as olive, canola, soy, corn, sunflower and peanut. Check the labels, and avoid partially hydrogenated oil, which have unhealthy trans fats.   If youre thirsty, drink water. Coffee and tea are good in moderation, but skip sugary drinks and limit milk and dairy products to one or two daily servings.   The type of carbohydrate in the diet is more important than the amount. Some sources of carbohydrates, such as vegetables, fruits, whole grains, and beans-are healthier than others.   Finally, stay active  Signed, Berniece Salines, DO  03/06/2022 8:21 PM    [facility]

## 2022-04-28 ENCOUNTER — Encounter: Payer: Self-pay | Admitting: Cardiology

## 2022-05-11 ENCOUNTER — Ambulatory Visit: Payer: No Typology Code available for payment source | Admitting: Family Medicine

## 2022-05-11 ENCOUNTER — Encounter: Payer: Self-pay | Admitting: Family Medicine

## 2022-05-11 VITALS — BP 100/80 | HR 67 | Temp 97.8°F | Resp 18 | Ht 68.0 in | Wt 167.4 lb

## 2022-05-11 DIAGNOSIS — E785 Hyperlipidemia, unspecified: Secondary | ICD-10-CM

## 2022-05-11 DIAGNOSIS — R5383 Other fatigue: Secondary | ICD-10-CM

## 2022-05-11 DIAGNOSIS — I10 Essential (primary) hypertension: Secondary | ICD-10-CM

## 2022-05-11 LAB — LIPID PANEL
Cholesterol: 186 mg/dL (ref 0–200)
HDL: 48 mg/dL (ref >39.00)
LDL Cholesterol: 125 mg/dL — ABNORMAL HIGH (ref 0–99)
NonHDL: 138.14
Total CHOL/HDL Ratio: 4
Triglycerides: 65 mg/dL (ref 0.0–149.0)
VLDL: 13 mg/dL (ref 0.0–40.0)

## 2022-05-11 LAB — CBC WITH DIFFERENTIAL/PLATELET
Basophils Absolute: 0.1 10*3/uL (ref 0.0–0.1)
Basophils Relative: 1.2 % (ref 0.0–3.0)
Eosinophils Absolute: 0.1 10*3/uL (ref 0.0–0.7)
Eosinophils Relative: 2.2 % (ref 0.0–5.0)
HCT: 43.3 % (ref 36.0–46.0)
Hemoglobin: 15 g/dL (ref 12.0–15.0)
Lymphocytes Relative: 31.1 % (ref 12.0–46.0)
Lymphs Abs: 1.8 10*3/uL (ref 0.7–4.0)
MCHC: 34.7 g/dL (ref 30.0–36.0)
MCV: 91.2 fl (ref 78.0–100.0)
Monocytes Absolute: 0.3 10*3/uL (ref 0.1–1.0)
Monocytes Relative: 5.1 % (ref 3.0–12.0)
Neutro Abs: 3.5 10*3/uL (ref 1.4–7.7)
Neutrophils Relative %: 60.4 % (ref 43.0–77.0)
Platelets: 237 10*3/uL (ref 150.0–400.0)
RBC: 4.75 Mil/uL (ref 3.87–5.11)
RDW: 12.7 % (ref 11.5–15.5)
WBC: 5.8 10*3/uL (ref 4.0–10.5)

## 2022-05-11 LAB — COMPREHENSIVE METABOLIC PANEL
ALT: 25 U/L (ref 0–35)
AST: 15 U/L (ref 0–37)
Albumin: 4.1 g/dL (ref 3.5–5.2)
Alkaline Phosphatase: 65 U/L (ref 39–117)
BUN: 10 mg/dL (ref 6–23)
CO2: 27 mEq/L (ref 19–32)
Calcium: 9.5 mg/dL (ref 8.4–10.5)
Chloride: 105 mEq/L (ref 96–112)
Creatinine, Ser: 0.74 mg/dL (ref 0.40–1.20)
GFR: 94.61 mL/min (ref >60.00)
Glucose, Bld: 79 mg/dL (ref 70–99)
Potassium: 4.1 mEq/L (ref 3.5–5.1)
Sodium: 140 mEq/L (ref 135–145)
Total Bilirubin: 1.2 mg/dL (ref 0.2–1.2)
Total Protein: 6.5 g/dL (ref 6.0–8.3)

## 2022-05-11 LAB — TSH: TSH: 0.41 u[IU]/mL (ref 0.35–5.50)

## 2022-05-11 LAB — VITAMIN B12: Vitamin B-12: >1500 pg/mL — ABNORMAL HIGH (ref 211–911)

## 2022-05-11 MED ORDER — LISINOPRIL 10 MG PO TABS: 10.0000 mg | ORAL_TABLET | Freq: Every day | ORAL | 2 refills | Status: DC

## 2022-05-11 NOTE — Assessment & Plan Note (Signed)
Running low Decrease lisinopril hct to lisinopril 10 mg  F/u 2-3 weeks  Check labs

## 2022-05-11 NOTE — Progress Notes (Signed)
Subjective:   By signing my name below, I, Shehryar Baig, attest that this documentation has been prepared under the direction and in the presence of Ann Held, DO. 05/11/2022   Patient ID: Tina Frost, female    DOB: 1970/04/04, 52 y.o.   MRN: [id number]  Chief Complaint  Patient presents with   Hypertension    Pt states wanting to discuss lowering blood pressure medication due to dizziness and low blood pressures   Follow-up    Hypertension Associated symptoms include malaise/fatigue. Pertinent negatives include no blurred vision, chest pain, headaches, palpitations or shortness of breath.   Patient is in today for a follow up visit.   She complains of episodes of dizziness and feeling fatigue. She is measuring her blood pressure regularly and notices her blood pressure drops during these episodes. She denies having chest pains.  BP Readings from Last 3 Encounters:  05/11/22 100/80  03/03/22 120/82  10/09/21 100/80   Pulse Readings from Last 3 Encounters:  05/11/22 67  03/03/22 70  10/09/21 73   She stopped taking Crestor for the past 6 months due to her last readings being stable.  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   Daytime somnolence 10/04/2019   Depression    DEPRESSION 04/27/2007   Qualifier: Diagnosis of  By: Jerold Coombe     Depression, major, single episode, mild ([place]) 09/10/2019   Dizzy 08/10/2019   Elevated hemoglobin ([place]) 08/10/2019   Essential hypertension 08/10/2019   External hemorrhoid 09/30/2016   History of iron deficiency 04/02/2017   History of kidney stones    Hyperlipidemia    Lower extremity edema 04/02/2017    Mixed hyperlipidemia 10/04/2019   NEPHROLITHIASIS, HX OF 08/29/2009   Qualifier: Diagnosis of  By: Jerold Coombe     Obesity (BMI 30-39.9) 04/24/2013   Os trigonum 03/16/2019   Other fatigue 04/02/2017   Overweight (BMI 25.0-29.9) 04/02/2017   Palpitations 10/04/2019   Peroneal tendinitis, right 03/16/2019   Rectal bleeding 09/30/2016   SOB (shortness of breath) 08/10/2019   WEIGHT GAIN 04/27/2007   Qualifier: Diagnosis of  By: Jerold Coombe      Past Surgical History:  Procedure Laterality Date   APPENDECTOMY     COLONOSCOPY  2002   HERNIA REPAIR  01/2015   INSERTION OF MESH N/A 02/11/2015   Procedure: INSERTION OF MESH;  Surgeon: Ralene Ok, MD;  Location: [facility];  Service: General;  Laterality: N/A;   KIDNEY STONE SURGERY  2008   TUBAL LIGATION     UMBILICAL HERNIA REPAIR N/A 02/11/2015   Procedure: LAPAROSCOPIC UMBILICAL HERNIA REPAIR WITH MESH;  Surgeon: Ralene Ok, MD;  Location: [facility];  Service: General;  Laterality: N/A;    Family History  Problem Relation Age of Onset   Breast cancer Mother    Lung cancer Paternal Grandmother    Stroke Father    Hypertension Other    Hyperlipidemia Other    Kidney disease Other        kidney stones   Alzheimer's disease Maternal Grandfather    Colon cancer  Neg Hx    Rectal cancer Neg Hx    Esophageal cancer Neg Hx    Liver cancer Neg Hx     Social History   Socioeconomic History   Marital status: Married    Spouse name: Not on file   Number of children: 3   Years of education: Not on file   Highest education level: Not on file  Occupational History   Occupation: Owns Engineer, site  Tobacco Use   Smoking status: Never   Smokeless tobacco: Never  Substance and Sexual Activity   Alcohol use: No   Drug use: No   Sexual activity: Yes    Partners: Male  Other Topics Concern   Not on file  Social History Narrative   Gets reg exercise   Social Determinants of Health   Financial Resource Strain: Not on file  Food  Insecurity: Not on file  Transportation Needs: Not on file  Physical Activity: Not on file  Stress: Not on file  Social Connections: Not on file  Intimate Partner Violence: Not on file    Outpatient Medications Prior to Visit  Medication Sig Dispense Refill   albuterol (VENTOLIN HFA) 108 (90 Base) MCG/ACT inhaler Inhale 2 puffs into the lungs every 6 (six) hours as needed for shortness of breath. 18 g 0   ALPRAZolam (XANAX) 0.25 MG tablet Take 1 tablet (0.25 mg total) by mouth 3 (three) times daily as needed. for anxiety 30 tablet 0   BD PEN NEEDLE NANO U/F 32G X 4 MM MISC Inject into the skin as directed.     buPROPion (WELLBUTRIN XL) 150 MG 24 hr tablet Take 150 mg by mouth every morning.     DULoxetine (CYMBALTA) 30 MG capsule Take 1 capsule (30 mg total) by mouth 2 (two) times daily. 180 capsule 3   levonorgestrel (MIRENA, 52 MG,) 20 MCG/24HR IUD Mirena 20 mcg/24 hours (6 yrs) 52 mg intrauterine device  Take 1 device by intrauterine route.     metoprolol succinate (TOPROL-XL) 25 MG 24 hr tablet Take 0.5 tablets (12.5 mg total) by mouth daily. 45 tablet 3   ondansetron (ZOFRAN ODT) 4 MG disintegrating tablet Take 1 tablet (4 mg total) by mouth every 8 (eight) hours as needed for nausea or vomiting. 20 tablet 0   pantoprazole (PROTONIX) 40 MG tablet Take 40 mg by mouth 2 (two) times daily.     phenazopyridine (PYRIDIUM) 200 MG tablet Take 1 tablet (200 mg total) by mouth 3 (three) times daily. 6 tablet 0   potassium chloride (KLOR-CON) 10 MEQ tablet Take 1 tablet (10 mEq total) by mouth every other day. 45 tablet 3   progesterone (PROMETRIUM) 100 MG capsule Take 300 mg by mouth at bedtime.     rosuvastatin (CRESTOR) 10 MG tablet Take 1 tablet (10 mg total) by mouth daily. 90 tablet 3   lisinopril-hydrochlorothiazide (ZESTORETIC) 20-12.5 MG tablet Take 1 tablet by mouth daily. 90 tablet 3   nitroGLYCERIN (NITROSTAT) 0.4 MG SL tablet Place 1 tablet (0.4 mg total) under the tongue every 5  (five) minutes as needed. 25 tablet 11   No facility-administered medications prior to visit.    Allergies  Allergen Reactions   Amoxicillin Other (See Comments)    Causes kidney stones  Causes kidney stones   Sulfa Antibiotics Other (See Comments)    Causes Kidney stones Causes Kidney stones    Review of Systems  Constitutional:  Positive for malaise/fatigue. Negative for fever.  HENT:  Negative for  congestion.   Eyes:  Negative for blurred vision.  Respiratory:  Negative for shortness of breath.   Cardiovascular:  Negative for chest pain, palpitations and leg swelling.  Gastrointestinal:  Negative for abdominal pain, blood in stool and nausea.  Genitourinary:  Negative for dysuria and frequency.  Musculoskeletal:  Negative for falls.  Skin:  Negative for rash.  Neurological:  Positive for dizziness. Negative for loss of consciousness and headaches.  Endo/Heme/Allergies:  Negative for environmental allergies.  Psychiatric/Behavioral:  Negative for depression. The patient is not nervous/anxious.        Objective:    Physical Exam Vitals and nursing note reviewed.  Constitutional:      General: She is not in acute distress.    Appearance: Normal appearance. She is well-developed. She is not ill-appearing.  HENT:     Head: Normocephalic and atraumatic.     Right Ear: External ear normal.     Left Ear: External ear normal.  Eyes:     Extraocular Movements: Extraocular movements intact.     Conjunctiva/sclera: Conjunctivae normal.     Pupils: Pupils are equal, round, and reactive to light.  Neck:     Thyroid: No thyromegaly.     Vascular: No carotid bruit or JVD.  Cardiovascular:     Rate and Rhythm: Normal rate and regular rhythm.     Heart sounds: Normal heart sounds. No murmur heard.    No gallop.  Pulmonary:     Effort: Pulmonary effort is normal. No respiratory distress.     Breath sounds: Normal breath sounds. No wheezing or rales.  Chest:     Chest wall:  No tenderness.  Musculoskeletal:     Cervical back: Normal range of motion and neck supple.  Skin:    General: Skin is warm and dry.  Neurological:     Mental Status: She is alert and oriented to person, place, and time.  Psychiatric:        Judgment: Judgment normal.     BP 100/80 (BP Location: Left Arm, Patient Position: Sitting, Cuff Size: Normal)   Pulse 67   Temp 97.8 F (36.6 C) (Oral)   Resp 18   Ht 5\' 8"  (1.727 m)   Wt 167 lb 6.4 oz (75.9 kg)   SpO2 98%   BMI 25.45 kg/m  Wt Readings from Last 3 Encounters:  05/11/22 167 lb 6.4 oz (75.9 kg)  03/03/22 176 lb 9.6 oz (80.1 kg)  10/09/21 168 lb 6.4 oz (76.4 kg)       Assessment & Plan:  Primary hypertension -     Lisinopril; Take 1 tablet (10 mg total) by mouth daily.  Dispense: 30 tablet; Refill: 2 -     CBC with Differential/Platelet -     Comprehensive metabolic panel -     TSH -     Vitamin B12  Other fatigue Assessment & Plan: Check labs  Hx anemia    Orders: -     CBC with Differential/Platelet -     Comprehensive metabolic panel -     TSH -     Vitamin B12  Hyperlipidemia, unspecified hyperlipidemia type Assessment & Plan: Pt is off crestor--check labs   Orders: -     Lipid panel  Essential hypertension Assessment & Plan: Running low Decrease lisinopril hct to lisinopril 10 mg  F/u 2-3 weeks  Check labs      I, Ann Held, DO, personally preformed the services described in this documentation.  All  medical record entries made by the scribe were at my direction and in my presence.  I have reviewed the chart and discharge instructions (if applicable) and agree that the record reflects my personal performance and is accurate and complete. 05/11/2022   I,Shehryar Baig,acting as a scribe for Ann Held, DO.,have documented all relevant documentation on the behalf of Ann Held, DO,as directed by  Ann Held, DO while in the presence of Ann Held, DO.   Ann Held, DO

## 2022-05-11 NOTE — Assessment & Plan Note (Signed)
Pt is off crestor--check labs

## 2022-05-11 NOTE — Assessment & Plan Note (Deleted)
Bp running low---- decrease lisinopril hct to lisinopril 10 mg daily  Recheck 2-3 weeks  Check labs

## 2022-05-11 NOTE — Assessment & Plan Note (Signed)
Check labs  Hx anemia

## 2022-05-11 NOTE — Patient Instructions (Signed)
Fatigue If you have fatigue, you feel tired all the time and have a lack of energy or a lack of motivation. Fatigue may make it difficult to start or complete tasks because of exhaustion. Occasional or mild fatigue is often a normal response to activity or life. However, long-term (chronic) or extreme fatigue may be a symptom of a medical condition such as: Depression. Not having enough red blood cells or hemoglobin in the blood (anemia). A problem with a small gland located in the lower front part of the neck (thyroid disorder). Rheumatologic conditions. These are problems related to the body's defense system (immune system). Infections, especially certain viral infections. Fatigue can also lead to negative health outcomes over time. Follow these instructions at home: Medicines Take over-the-counter and prescription medicines only as told by your health care provider. Take a multivitamin if told by your health care provider. Do not use herbal or dietary supplements unless they are approved by your health care provider. Eating and drinking  Avoid heavy meals in the evening. Eat a well-balanced diet, which includes lean proteins, whole grains, plenty of fruits and vegetables, and low-fat dairy products. Avoid eating or drinking too many products with caffeine in them. Avoid alcohol. Drink enough fluid to keep your urine pale yellow. Activity  Exercise regularly, as told by your health care provider. Use or practice techniques to help you relax, such as yoga, tai chi, meditation, or massage therapy. Lifestyle Change situations that cause you stress. Try to keep your work and personal schedules in balance. Do not use recreational or illegal drugs. General instructions Monitor your fatigue for any changes. Go to bed and get up at the same time every day. Avoid fatigue by pacing yourself during the day and getting enough sleep at night. Maintain a healthy weight. Contact a health care  provider if: Your fatigue does not get better. You have a fever. You suddenly lose or gain weight. You have headaches. You have trouble falling asleep or sleeping through the night. You feel angry, guilty, anxious, or sad. You have swelling in your legs or another part of your body. Get help right away if: You feel confused, feel like you might faint, or faint. Your vision is blurry or you have a severe headache. You have severe pain in your abdomen, your back, or the area between your waist and hips (pelvis). You have chest pain, shortness of breath, or an irregular or fast heartbeat. You are unable to urinate, or you urinate less than normal. You have abnormal bleeding from the rectum, nose, lungs, nipples, or, if you are female, the vagina. You vomit blood. You have thoughts about hurting yourself or others. These symptoms may be an emergency. Get help right away. Call 911. Do not wait to see if the symptoms will go away. Do not drive yourself to the hospital. Get help right away if you feel like you may hurt yourself or others, or have thoughts about taking your own life. Go to your nearest emergency room or: Call 911. Call the National Suicide Prevention Lifeline at 1-800-273-8255 or 988. This is open 24 hours a day. Text the Crisis Text Line at 741741. Summary If you have fatigue, you feel tired all the time and have a lack of energy or a lack of motivation. Fatigue may make it difficult to start or complete tasks because of exhaustion. Long-term (chronic) or extreme fatigue may be a symptom of a medical condition. Exercise regularly, as told by your health care provider.   Change situations that cause you stress. Try to keep your work and personal schedules in balance. This information is not intended to replace advice given to you by your health care provider. Make sure you discuss any questions you have with your health care provider. Document Revised: 12/02/2020 Document  Reviewed: 12/02/2020 Elsevier Patient Education  2023 [employer]  

## 2022-05-12 ENCOUNTER — Telehealth: Payer: Self-pay | Admitting: Family Medicine

## 2022-05-12 ENCOUNTER — Other Ambulatory Visit: Payer: Self-pay | Admitting: Family Medicine

## 2022-05-12 DIAGNOSIS — E785 Hyperlipidemia, unspecified: Secondary | ICD-10-CM

## 2022-05-12 DIAGNOSIS — I10 Essential (primary) hypertension: Secondary | ICD-10-CM

## 2022-05-12 MED ORDER — LISINOPRIL 10 MG PO TABS: 10.0000 mg | ORAL_TABLET | Freq: Every day | ORAL | 2 refills | Status: DC

## 2022-05-12 NOTE — Telephone Encounter (Signed)
Rx sent again

## 2022-05-12 NOTE — Telephone Encounter (Signed)
Pt states [facility] has still not received rx. They said we may just need to call the rx in. She is hoping to get this today if possible.

## 2022-05-12 NOTE — Telephone Encounter (Signed)
Patient states her pharmacy never received her medication so she would like for it to be resent. Please advise.   Lisinopril  [place], [place] - [address] [place], [place] [address] Phone: [phone]  Fax: [phone]

## 2022-07-07 ENCOUNTER — Encounter: Payer: Self-pay | Admitting: Family Medicine

## 2022-07-07 MED ORDER — LISINOPRIL 20 MG PO TABS: 20.0000 mg | ORAL_TABLET | Freq: Every day | ORAL | 2 refills | Status: DC

## 2022-07-10 ENCOUNTER — Ambulatory Visit: Payer: No Typology Code available for payment source | Admitting: Cardiology

## 2022-08-04 ENCOUNTER — Other Ambulatory Visit: Payer: Self-pay | Admitting: Family Medicine

## 2022-08-04 DIAGNOSIS — F419 Anxiety disorder, unspecified: Secondary | ICD-10-CM

## 2022-08-04 NOTE — Telephone Encounter (Signed)
Patient has called to schedule an appt for June 18th. States she will be out of her medication before her appt.

## 2022-08-04 NOTE — Telephone Encounter (Signed)
Requesting: alprazolam 0.25mg   Contract: None UDS: None Last Visit: 05/11/22 Next Visit: None Last Refill: 12/22/21 #30 and 0RF   Please Advise

## 2022-08-13 ENCOUNTER — Ambulatory Visit: Payer: No Typology Code available for payment source | Admitting: Family Medicine

## 2022-08-20 ENCOUNTER — Ambulatory Visit: Payer: BC Managed Care – PPO | Admitting: Family Medicine

## 2022-08-20 ENCOUNTER — Encounter: Payer: Self-pay | Admitting: Family Medicine

## 2022-08-20 VITALS — BP 100/80 | HR 74 | Temp 98.2°F | Resp 18 | Ht 68.0 in | Wt 168.0 lb

## 2022-08-20 DIAGNOSIS — R5383 Other fatigue: Secondary | ICD-10-CM

## 2022-08-20 DIAGNOSIS — E785 Hyperlipidemia, unspecified: Secondary | ICD-10-CM | POA: Diagnosis not present

## 2022-08-20 DIAGNOSIS — Z1211 Encounter for screening for malignant neoplasm of colon: Secondary | ICD-10-CM

## 2022-08-20 DIAGNOSIS — E782 Mixed hyperlipidemia: Secondary | ICD-10-CM

## 2022-08-20 DIAGNOSIS — I10 Essential (primary) hypertension: Secondary | ICD-10-CM

## 2022-08-20 DIAGNOSIS — F419 Anxiety disorder, unspecified: Secondary | ICD-10-CM | POA: Diagnosis not present

## 2022-08-20 DIAGNOSIS — Z23 Encounter for immunization: Secondary | ICD-10-CM | POA: Diagnosis not present

## 2022-08-20 MED ORDER — ALPRAZOLAM 0.25 MG PO TABS: 0.2500 mg | ORAL_TABLET | Freq: Three times a day (TID) | ORAL | 1 refills | Status: DC | PRN

## 2022-08-20 NOTE — Patient Instructions (Signed)

## 2022-08-20 NOTE — Assessment & Plan Note (Signed)
Encourage heart healthy diet such as MIND or DASH diet, increase exercise, avoid trans fats, simple carbohydrates and processed foods, consider a krill or fish or flaxseed oil cap daily.  °

## 2022-08-20 NOTE — Assessment & Plan Note (Signed)
Well controlled, no changes to meds. Encouraged heart healthy diet such as the DASH diet and exercise as tolerated.  °

## 2022-08-20 NOTE — Progress Notes (Signed)
Established Patient Office Visit  Subjective   Patient ID: Tina Frost, female    DOB: March 04, 1970  Age: 52 y.o. MRN: [id number]  Chief Complaint  Patient presents with   Hypertension   Anxiety   Follow-up    HPI Discussed the use of AI scribe software for clinical note transcription with the patient, who gave verbal consent to proceed.  History of Present Illness   The patient presents for a medication refill, specifically alprazolam, which they take approximately once a week, or more frequently depending on their anxiety levels. They report occasional dizziness and have lost about thirty pounds recently. Despite the weight loss, their blood pressure has remained stable on their current medication regimen. They have a history of high cholesterol, with recent labs showing an increase in their bad cholesterol. They also report some swelling in one ankle, but note that it has been much worse in the past.      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   Peroneal tendinitis, right 03/16/2019   Os trigonum 03/16/2019   Overweight (BMI 25.0-29.9) 04/02/2017   Other fatigue 04/02/2017   History of iron deficiency 04/02/2017   Lower extremity edema 04/02/2017   Atypical nevus 03/17/2017   Rectal bleeding 09/30/2016   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   External hemorrhoid 09/30/2016   Obesity (BMI 30-39.9) 04/24/2013   NEPHROLITHIASIS, HX OF 08/29/2009   DEPRESSION 04/27/2007   WEIGHT GAIN 04/27/2007   Past  Medical History:  Diagnosis Date   Abdominal pain    Anxiety    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   Atypical nevus 03/17/2017   right calf   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   Daytime somnolence 10/04/2019   Depression    DEPRESSION 04/27/2007   Qualifier: Diagnosis of  By: Janit Bern     Depression, major, single episode, mild (HCC) 09/10/2019   Dizzy 08/10/2019   Elevated hemoglobin (HCC) 08/10/2019   Essential hypertension 08/10/2019   External hemorrhoid 09/30/2016   History of iron deficiency 04/02/2017   History of kidney stones    Hyperlipidemia    Lower extremity edema 04/02/2017   Mixed hyperlipidemia 10/04/2019   NEPHROLITHIASIS, HX OF 08/29/2009   Qualifier: Diagnosis of  By: Janit Bern     Obesity (BMI 30-39.9) 04/24/2013   Os trigonum 03/16/2019   Other fatigue 04/02/2017   Overweight (BMI 25.0-29.9) 04/02/2017   Palpitations 10/04/2019   Peroneal tendinitis, right 03/16/2019   Rectal bleeding 09/30/2016   SOB (shortness of breath) 08/10/2019   WEIGHT GAIN 04/27/2007   Qualifier: Diagnosis of  By: Janit Bern     Past Surgical History:  Procedure Laterality Date   APPENDECTOMY     COLONOSCOPY  2002   HERNIA REPAIR  01/2015   INSERTION OF MESH N/A 02/11/2015   Procedure: INSERTION OF MESH;  Surgeon: Axel Filler, MD;  Location: [facility] OR;  Service: General;  Laterality: N/A;   KIDNEY STONE SURGERY  2008   TUBAL LIGATION  UMBILICAL HERNIA REPAIR N/A 02/11/2015   Procedure: LAPAROSCOPIC UMBILICAL HERNIA REPAIR WITH MESH;  Surgeon: Axel Filler, MD;  Location: [facility] OR;  Service: General;  Laterality: N/A;   Social History   Tobacco Use   Smoking status: Never   Smokeless tobacco: Never  Substance Use Topics   Alcohol use: No   Drug use: No   Social History   Socioeconomic History   Marital status: Married    Spouse name: Not on file   Number of children: 3   Years of education: Not on file   Highest education level: Not on file   Occupational History   Occupation: Owns Hotel manager  Tobacco Use   Smoking status: Never   Smokeless tobacco: Never  Substance and Sexual Activity   Alcohol use: No   Drug use: No   Sexual activity: Yes    Partners: Male  Other Topics Concern   Not on file  Social History Narrative   Gets reg exercise   Social Determinants of Health   Financial Resource Strain: Not on file  Food Insecurity: Not on file  Transportation Needs: Not on file  Physical Activity: Not on file  Stress: Not on file  Social Connections: Not on file  Intimate Partner Violence: Not on file   Family Status  Relation Name Status   Mother  Alive   PGM  Deceased   Father  Alive   Other fam hx of Alive   MGM  Deceased   MGF  Deceased   PGF  Deceased   Neg Hx  (Not Specified)   Family History  Problem Relation Age of Onset   Breast cancer Mother    Lung cancer Paternal Grandmother    Stroke Father    Hypertension Other    Hyperlipidemia Other    Kidney disease Other        kidney stones   Alzheimer's disease Maternal Grandfather    Colon cancer Neg Hx    Rectal cancer Neg Hx    Esophageal cancer Neg Hx    Liver cancer Neg Hx    Allergies  Allergen Reactions   Amoxicillin Other (See Comments)    Causes kidney stones  Causes kidney stones   Sulfa Antibiotics Other (See Comments)    Causes Kidney stones Causes Kidney stones      Review of Systems  Constitutional:  Negative for chills, fever and malaise/fatigue.  HENT:  Negative for congestion and hearing loss.   Eyes:  Negative for blurred vision and discharge.  Respiratory:  Negative for cough, sputum production and shortness of breath.   Cardiovascular:  Negative for chest pain, palpitations and leg swelling.  Gastrointestinal:  Negative for abdominal pain, blood in stool, constipation, diarrhea, heartburn, nausea and vomiting.  Genitourinary:  Negative for dysuria, frequency, hematuria and urgency.  Musculoskeletal:  Negative for  back pain, falls and myalgias.  Skin:  Negative for rash.  Neurological:  Negative for dizziness, sensory change, loss of consciousness, weakness and headaches.  Endo/Heme/Allergies:  Negative for environmental allergies. Does not bruise/bleed easily.  Psychiatric/Behavioral:  Negative for depression and suicidal ideas. The patient is not nervous/anxious and does not have insomnia.      Objective:     BP 100/80 (BP Location: Left Arm, Patient Position: Sitting, Cuff Size: Normal)   Pulse 74   Temp 98.2 F (36.8 C) (Oral)   Resp 18   Ht 5\' 8"  (1.727 m)   Wt 168 lb (76.2 kg)   SpO2 98%  BMI 25.54 kg/m  BP Readings from Last 3 Encounters:  08/20/22 100/80  05/11/22 100/80  03/03/22 120/82   Wt Readings from Last 3 Encounters:  08/20/22 168 lb (76.2 kg)  05/11/22 167 lb 6.4 oz (75.9 kg)  03/03/22 176 lb 9.6 oz (80.1 kg)   SpO2 Readings from Last 3 Encounters:  08/20/22 98%  05/11/22 98%  03/03/22 99%      Physical Exam Vitals and nursing note reviewed.  Constitutional:      General: She is not in acute distress.    Appearance: Normal appearance. She is well-developed.  HENT:     Head: Normocephalic and atraumatic.     Right Ear: Tympanic membrane, ear canal and external ear normal. There is no impacted cerumen.     Left Ear: Tympanic membrane, ear canal and external ear normal. There is no impacted cerumen.     Nose: Nose normal.     Mouth/Throat:     Mouth: Mucous membranes are moist.     Pharynx: Oropharynx is clear. No oropharyngeal exudate or posterior oropharyngeal erythema.  Eyes:     General: No scleral icterus.       Right eye: No discharge.        Left eye: No discharge.     Conjunctiva/sclera: Conjunctivae normal.     Pupils: Pupils are equal, round, and reactive to light.  Neck:     Thyroid: No thyromegaly or thyroid tenderness.     Vascular: No JVD.  Cardiovascular:     Rate and Rhythm: Normal rate and regular rhythm.     Heart sounds: Normal  heart sounds. No murmur heard. Pulmonary:     Effort: Pulmonary effort is normal. No respiratory distress.     Breath sounds: Normal breath sounds.  Abdominal:     General: Bowel sounds are normal. There is no distension.     Palpations: Abdomen is soft. There is no mass.     Tenderness: There is no abdominal tenderness. There is no guarding or rebound.  Genitourinary:    Vagina: Normal.  Musculoskeletal:        General: Normal range of motion.     Cervical back: Normal range of motion and neck supple.     Right lower leg: No edema.     Left lower leg: No edema.  Lymphadenopathy:     Cervical: No cervical adenopathy.  Skin:    General: Skin is warm and dry.     Findings: No erythema or rash.  Neurological:     General: No focal deficit present.     Mental Status: She is alert and oriented to person, place, and time.     Cranial Nerves: No cranial nerve deficit.     Deep Tendon Reflexes: Reflexes are normal and symmetric.  Psychiatric:        Mood and Affect: Mood normal.        Behavior: Behavior normal.        Thought Content: Thought content normal.        Judgment: Judgment normal.    No results found for any visits on 08/20/22.  Last CBC Lab Results  Component Value Date   WBC 5.8 05/11/2022   HGB 15.0 05/11/2022   HCT 43.3 05/11/2022   MCV 91.2 05/11/2022   MCH 31.9 03/16/2020   RDW 12.7 05/11/2022   PLT 237.0 05/11/2022   Last metabolic panel Lab Results  Component Value Date   GLUCOSE 79 05/11/2022   NA 140 05/11/2022  K 4.1 05/11/2022   CL 105 05/11/2022   CO2 27 05/11/2022   BUN 10 05/11/2022   CREATININE 0.74 05/11/2022   EGFR 113 09/23/2020   CALCIUM 9.5 05/11/2022   PROT 6.5 05/11/2022   ALBUMIN 4.1 05/11/2022   BILITOT 1.2 05/11/2022   ALKPHOS 65 05/11/2022   AST 15 05/11/2022   ALT 25 05/11/2022   ANIONGAP 11 03/16/2020   Last lipids Lab Results  Component Value Date   CHOL 186 05/11/2022   HDL 48.00 05/11/2022   LDLCALC 125 (H)  05/11/2022   TRIG 65.0 05/11/2022   CHOLHDL 4 05/11/2022   Last hemoglobin A1c No results found for: "HGBA1C" Last thyroid functions Lab Results  Component Value Date   TSH 0.41 05/11/2022   T4TOTAL 7.8 04/02/2017   Last vitamin D Lab Results  Component Value Date   VD25OH 19.35 (L) 04/02/2017   Last vitamin B12 and Folate Lab Results  Component Value Date   VITAMINB12 >1500 (H) 05/11/2022      The 10-year ASCVD risk score (Arnett DK, et al., 2019) is: 1.1%    Assessment & Plan:   Problem List Items Addressed This Visit       Unprioritized   Anxiety   Relevant Medications   ALPRAZolam (XANAX) 0.25 MG tablet   Other Visit Diagnoses     Colon cancer screening    -  Primary   Relevant Orders   Ambulatory referral to Gastroenterology   Need for shingles vaccine       Relevant Orders   Zoster Recombinant (Shingrix )   Need for Tdap vaccination       Relevant Orders   Tdap vaccine greater than or equal to 7yo IM     Assessment and Plan    Anxiety: Well controlled with intermittent use of Alprazolam. -Refill Alprazolam as needed.  Hypertension: Blood pressure well controlled, possibly over controlled with current regimen. Patient reports occasional dizziness and significant weight loss. Previous attempt to decrease medication resulted in increased blood pressure. -Continue current antihypertensive regimen. Monitor for symptoms of hypotension.  Hyperlipidemia: LDL cholesterol slightly elevated on previous labs. -Plan for repeat lipid panel at next physical.  General Health Maintenance: -Administer Shingles vaccine and Tetanus vaccine today. -Schedule second dose of Shingles vaccine in two months. -Schedule routine physical in six months. -Recommend colonoscopy due to age and time since last screening. -Continue regular Pap smears with new OBGYN, Dr. Clance Boll.        Return in about 6 months (around 02/19/2023) for annual exam, fasting--- shingles in  2-6 months .    Donato Schultz, DO

## 2022-08-21 NOTE — Assessment & Plan Note (Signed)
Encourage heart healthy diet such as MIND or DASH diet, increase exercise, avoid trans fats, simple carbohydrates and processed foods, consider a krill or fish or flaxseed oil cap daily.  °

## 2022-09-23 ENCOUNTER — Encounter (INDEPENDENT_AMBULATORY_CARE_PROVIDER_SITE_OTHER): Payer: Self-pay

## 2022-10-05 ENCOUNTER — Other Ambulatory Visit: Payer: Self-pay | Admitting: Family Medicine

## 2022-10-20 DIAGNOSIS — Z1231 Encounter for screening mammogram for malignant neoplasm of breast: Secondary | ICD-10-CM | POA: Diagnosis not present

## 2022-10-20 DIAGNOSIS — Z01419 Encounter for gynecological examination (general) (routine) without abnormal findings: Secondary | ICD-10-CM | POA: Diagnosis not present

## 2022-10-20 DIAGNOSIS — Z124 Encounter for screening for malignant neoplasm of cervix: Secondary | ICD-10-CM | POA: Diagnosis not present

## 2022-10-20 LAB — HM MAMMOGRAPHY

## 2022-10-22 ENCOUNTER — Ambulatory Visit: Payer: BC Managed Care – PPO

## 2022-10-29 ENCOUNTER — Encounter: Payer: Self-pay | Admitting: Obstetrics and Gynecology

## 2022-11-02 DIAGNOSIS — Z30432 Encounter for removal of intrauterine contraceptive device: Secondary | ICD-10-CM | POA: Diagnosis not present

## 2022-11-02 DIAGNOSIS — T8332XA Displacement of intrauterine contraceptive device, initial encounter: Secondary | ICD-10-CM | POA: Diagnosis not present

## 2022-11-17 DIAGNOSIS — F4321 Adjustment disorder with depressed mood: Secondary | ICD-10-CM | POA: Diagnosis not present

## 2022-11-24 DIAGNOSIS — F4321 Adjustment disorder with depressed mood: Secondary | ICD-10-CM | POA: Diagnosis not present

## 2023-01-19 ENCOUNTER — Other Ambulatory Visit: Payer: Self-pay | Admitting: Family Medicine

## 2023-01-19 ENCOUNTER — Telehealth: Payer: Self-pay | Admitting: Family Medicine

## 2023-01-19 DIAGNOSIS — F419 Anxiety disorder, unspecified: Secondary | ICD-10-CM

## 2023-01-19 NOTE — Telephone Encounter (Signed)
Prescription Request  01/19/2023  Is this a "Controlled Substance" medicine? No  LOV: 08/20/2022  What is the name of the medication or equipment?   DULoxetine (CYMBALTA) 30 MG capsule  Have you contacted your pharmacy to request a refill? No   Which pharmacy would you like this sent to?  [facility] - [place], [place] - [address] [place] [address] Phone: [phone] Fax: [phone]      Patient notified that their request is being sent to the clinical staff for review and that they should receive a response within 2 business days.   Please advise at [facility] [phone]

## 2023-01-19 NOTE — Telephone Encounter (Signed)
Rx sent in and patient notified.  Also cpe made for 03/02/23

## 2023-03-02 ENCOUNTER — Encounter: Payer: BC Managed Care – PPO | Admitting: Family Medicine

## 2023-03-08 ENCOUNTER — Other Ambulatory Visit: Payer: Self-pay | Admitting: Cardiology

## 2023-03-09 ENCOUNTER — Encounter: Payer: BC Managed Care – PPO | Admitting: Family Medicine

## 2023-03-15 ENCOUNTER — Ambulatory Visit (INDEPENDENT_AMBULATORY_CARE_PROVIDER_SITE_OTHER): Payer: BC Managed Care – PPO | Admitting: Family Medicine

## 2023-03-15 ENCOUNTER — Encounter: Payer: Self-pay | Admitting: Family Medicine

## 2023-03-15 VITALS — BP 122/90 | HR 82 | Temp 98.1°F | Resp 18 | Ht 68.0 in | Wt 185.6 lb

## 2023-03-15 DIAGNOSIS — Z0000 Encounter for general adult medical examination without abnormal findings: Secondary | ICD-10-CM

## 2023-03-15 DIAGNOSIS — F419 Anxiety disorder, unspecified: Secondary | ICD-10-CM

## 2023-03-15 DIAGNOSIS — E785 Hyperlipidemia, unspecified: Secondary | ICD-10-CM | POA: Diagnosis not present

## 2023-03-15 DIAGNOSIS — I10 Essential (primary) hypertension: Secondary | ICD-10-CM | POA: Diagnosis not present

## 2023-03-15 MED ORDER — DULOXETINE HCL 30 MG PO CPEP: 30.0000 mg | ORAL_CAPSULE | Freq: Two times a day (BID) | ORAL | 3 refills | Status: DC

## 2023-03-15 MED ORDER — LISINOPRIL 20 MG PO TABS: 20.0000 mg | ORAL_TABLET | Freq: Every day | ORAL | 1 refills | Status: DC

## 2023-03-15 NOTE — Progress Notes (Signed)
   Established Patient Office Visit  Subjective   Patient ID: Tina Frost, female    DOB: 1971-02-07  Age: 53 y.o. MRN: [id number]  Chief Complaint  Patient presents with   Annual Exam    Pt states not fasting     HPI    ROS    Objective:     BP (!) 122/90 (BP Location: Right Arm, Patient Position: Sitting, Cuff Size: Normal)   Pulse 82   Temp 98.1 F (36.7 C) (Oral)   Resp 18   Ht 5\' 8"  (1.727 m)   Wt 185 lb 9.6 oz (84.2 kg)   SpO2 98%   BMI 28.22 kg/m    Physical Exam   No results found for any visits on 03/15/23.    The 10-year ASCVD risk score (Arnett DK, et al., 2019) is: 1.6%    Assessment & Plan:   Problem List Items Addressed This Visit       Unprioritized   Hyperlipidemia   Relevant Orders   Comprehensive metabolic panel   Lipid panel   Other Visit Diagnoses       Preventative health care    -  Primary   Relevant Orders   CBC with Differential/Platelet   Comprehensive metabolic panel   Lipid panel   TSH     Primary hypertension       Relevant Orders   CBC with Differential/Platelet   Comprehensive metabolic panel   Lipid panel   TSH       No follow-ups on file.    Donato Schultz, DO

## 2023-03-16 ENCOUNTER — Other Ambulatory Visit: Payer: BC Managed Care – PPO

## 2023-03-18 ENCOUNTER — Other Ambulatory Visit (INDEPENDENT_AMBULATORY_CARE_PROVIDER_SITE_OTHER): Payer: BC Managed Care – PPO

## 2023-03-18 DIAGNOSIS — Z0000 Encounter for general adult medical examination without abnormal findings: Secondary | ICD-10-CM | POA: Diagnosis not present

## 2023-03-18 DIAGNOSIS — I10 Essential (primary) hypertension: Secondary | ICD-10-CM

## 2023-03-18 DIAGNOSIS — E785 Hyperlipidemia, unspecified: Secondary | ICD-10-CM | POA: Diagnosis not present

## 2023-03-18 LAB — CBC WITH DIFFERENTIAL/PLATELET
Basophils Absolute: 0.1 10*3/uL (ref 0.0–0.1)
Basophils Relative: 1.4 % (ref 0.0–3.0)
Eosinophils Absolute: 0.2 10*3/uL (ref 0.0–0.7)
Eosinophils Relative: 4.1 % (ref 0.0–5.0)
HCT: 48.3 % — ABNORMAL HIGH (ref 36.0–46.0)
Hemoglobin: 16.2 g/dL — ABNORMAL HIGH (ref 12.0–15.0)
Lymphocytes Relative: 24.2 % (ref 12.0–46.0)
Lymphs Abs: 1.4 10*3/uL (ref 0.7–4.0)
MCHC: 33.6 g/dL (ref 30.0–36.0)
MCV: 94.6 fL (ref 78.0–100.0)
Monocytes Absolute: 0.5 10*3/uL (ref 0.1–1.0)
Monocytes Relative: 8.4 % (ref 3.0–12.0)
Neutro Abs: 3.5 10*3/uL (ref 1.4–7.7)
Neutrophils Relative %: 61.9 % (ref 43.0–77.0)
Platelets: 255 10*3/uL (ref 150.0–400.0)
RBC: 5.11 Mil/uL (ref 3.87–5.11)
RDW: 13.4 % (ref 11.5–15.5)
WBC: 5.7 10*3/uL (ref 4.0–10.5)

## 2023-03-18 LAB — COMPREHENSIVE METABOLIC PANEL
ALT: 14 U/L (ref 0–35)
AST: 14 U/L (ref 0–37)
Albumin: 4.6 g/dL (ref 3.5–5.2)
Alkaline Phosphatase: 75 U/L (ref 39–117)
BUN: 13 mg/dL (ref 6–23)
CO2: 27 meq/L (ref 19–32)
Calcium: 9.6 mg/dL (ref 8.4–10.5)
Chloride: 104 meq/L (ref 96–112)
Creatinine, Ser: 0.8 mg/dL (ref 0.40–1.20)
GFR: 85.65 mL/min (ref >60.00)
Glucose, Bld: 82 mg/dL (ref 70–99)
Potassium: 4.3 meq/L (ref 3.5–5.1)
Sodium: 140 meq/L (ref 135–145)
Total Bilirubin: 0.9 mg/dL (ref 0.2–1.2)
Total Protein: 6.9 g/dL (ref 6.0–8.3)

## 2023-03-18 LAB — LIPID PANEL
Cholesterol: 162 mg/dL (ref 0–200)
HDL: 62.8 mg/dL (ref >39.00)
LDL Cholesterol: 89 mg/dL (ref 0–99)
NonHDL: 99.24
Total CHOL/HDL Ratio: 3
Triglycerides: 52 mg/dL (ref 0.0–149.0)
VLDL: 10.4 mg/dL (ref 0.0–40.0)

## 2023-03-18 LAB — TSH: TSH: 0.43 u[IU]/mL (ref 0.35–5.50)

## 2023-03-19 NOTE — Addendum Note (Signed)
Addended by: Seabron Spates R on: 03/19/2023 01:00 PM   Modules accepted: Level of Service

## 2023-03-20 ENCOUNTER — Encounter: Payer: Self-pay | Admitting: Family Medicine

## 2023-04-01 ENCOUNTER — Telehealth: Payer: Self-pay | Admitting: Cardiology

## 2023-04-01 MED ORDER — METOPROLOL SUCCINATE ER 25 MG PO TB24: 12.5000 mg | ORAL_TABLET | Freq: Every day | ORAL | 1 refills | Status: DC

## 2023-04-01 NOTE — Telephone Encounter (Signed)
*  STAT* If patient is at the pharmacy, call can be transferred to refill team.   1. Which medications need to be refilled? (please list name of each medication and dose if known)   metoprolol  succinate (TOPROL -XL) 25 MG 24 hr tablet   2. Would you like to learn more about the convenience, safety, & potential cost savings by using the [facility]?   3. Are you open to using the [facility] (Type [facility]. ).  4. Which pharmacy/location (including street and city if local pharmacy) is medication to be sent to?  [facility] - [place], [place] - [address]   5. Do they need a 30 day or 90 day supply?   90 day  Patient stated she is completely out of this medication.

## 2023-06-29 ENCOUNTER — Encounter: Payer: Self-pay | Admitting: Cardiology

## 2023-06-29 ENCOUNTER — Ambulatory Visit: Payer: BC Managed Care – PPO | Attending: Cardiology | Admitting: Cardiology

## 2023-06-29 VITALS — BP 134/88 | HR 69 | Ht 68.0 in | Wt 180.0 lb

## 2023-06-29 DIAGNOSIS — I10 Essential (primary) hypertension: Secondary | ICD-10-CM | POA: Diagnosis not present

## 2023-06-29 DIAGNOSIS — E782 Mixed hyperlipidemia: Secondary | ICD-10-CM | POA: Diagnosis not present

## 2023-06-29 MED ORDER — OLMESARTAN MEDOXOMIL 20 MG PO TABS: 20.0000 mg | ORAL_TABLET | Freq: Every day | ORAL | 3 refills | Status: AC

## 2023-06-29 NOTE — Patient Instructions (Signed)
 Medication Instructions:  Your physician has recommended you make the following change in your medication:  STOP: Lisinopril  START: Olmesartan 20 mg once daily  *If you need a refill on your cardiac medications before your next appointment, please call your pharmacy*  Follow-Up: At [facility], you and your health needs are our priority.  As part of our continuing mission to provide you with exceptional heart care, our providers are all part of one team.  This team includes your primary Cardiologist (physician) and Advanced Practice Providers or APPs (Physician Assistants and Nurse Practitioners) who all work together to provide you with the care you need, when you need it.  Your next appointment:   1 year(s)  Provider:   Kardie Tobb, DO

## 2023-06-29 NOTE — Progress Notes (Signed)
 Cardiology Office Note:    Date:  07/02/2023   ID:  Tina Frost, DOB 1970-09-23, MRN [id number]  PCP:  Crecencio Dodge, Candida Chalk, DO  Cardiologist:  Tianni Escamilla, DO  Electrophysiologist:  None   Referring MD: Crecencio Dodge, Candida Chalk, *   " I am having leg swelling and shortness of breath"   History of Present Illness:    Tina Frost is a 53 y.o. female with a hx of hypertension, hyperlipidemia paroxysmal atrial tachycardia on Toprol -XL, obesity is here today for follow-up visit due to significant leg swelling and intermittent shortness of breath.  At her last visit she was doing well from a CV standpoint.    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   Daytime somnolence 10/04/2019   Depression    DEPRESSION 04/27/2007   Qualifier: Diagnosis of  By: Tracy Friedlander     Depression, major, single episode, mild (HCC) 09/10/2019   Dizzy 08/10/2019   Elevated hemoglobin (HCC) 08/10/2019   Essential hypertension 08/10/2019   External hemorrhoid 09/30/2016   History of iron deficiency 04/02/2017   History of kidney stones    Hyperlipidemia    Lower extremity edema 04/02/2017   Mixed hyperlipidemia 10/04/2019   NEPHROLITHIASIS, HX OF 08/29/2009   Qualifier: Diagnosis of  By: Tracy Friedlander     Obesity (BMI 30-39.9) 04/24/2013   Os trigonum 03/16/2019   Other fatigue 04/02/2017   Overweight (BMI 25.0-29.9) 04/02/2017   Palpitations 10/04/2019   Peroneal tendinitis, right 03/16/2019   Rectal bleeding 09/30/2016   SOB (shortness of breath) 08/10/2019   WEIGHT GAIN 04/27/2007   Qualifier: Diagnosis of  By: Tracy Friedlander      Past Surgical History:  Procedure Laterality Date   APPENDECTOMY     COLONOSCOPY  2002   HERNIA REPAIR  01/2015   INSERTION OF MESH N/A 02/11/2015   Procedure: INSERTION OF MESH;  Surgeon: Shela Derby, MD;   Location: [facility] OR;  Service: General;  Laterality: N/A;   KIDNEY STONE SURGERY  2008   TUBAL LIGATION     UMBILICAL HERNIA REPAIR N/A 02/11/2015   Procedure: LAPAROSCOPIC UMBILICAL HERNIA REPAIR WITH MESH;  Surgeon: Shela Derby, MD;  Location: [facility] OR;  Service: General;  Laterality: N/A;    Current Medications: Current Meds  Medication Sig   albuterol  (VENTOLIN  HFA) 108 (90 Base) MCG/ACT inhaler Inhale 2 puffs into the lungs every 6 (six) hours as needed for shortness of breath.   ALPRAZolam  (XANAX ) 0.25 MG tablet Take 1 tablet (0.25 mg total) by mouth 3 (three) times daily as needed. for anxiety   BD PEN NEEDLE NANO U/F 32G X 4 MM MISC Inject into the skin as directed.   buPROPion (WELLBUTRIN XL) 150 MG 24 hr tablet Take 150 mg by mouth every morning.   DULoxetine  (CYMBALTA ) 30 MG capsule Take 1 capsule (30 mg total) by mouth 2 (two) times daily.   metoprolol  succinate (TOPROL -XL) 25 MG 24 hr tablet Take 0.5 tablets (12.5 mg total) by mouth daily.   olmesartan  (BENICAR ) 20 MG tablet Take 1 tablet (20 mg total) by mouth daily.   ondansetron  (ZOFRAN  ODT) 4 MG disintegrating tablet Take 1 tablet (4 mg total) by mouth every 8 (eight) hours as needed for nausea or vomiting.   pantoprazole (PROTONIX) 40  MG tablet Take 40 mg by mouth 2 (two) times daily.   phenazopyridine  (PYRIDIUM ) 200 MG tablet Take 1 tablet (200 mg total) by mouth 3 (three) times daily.   progesterone (PROMETRIUM) 100 MG capsule Take 300 mg by mouth at bedtime.   rosuvastatin  (CRESTOR ) 10 MG tablet Take 1 tablet (10 mg total) by mouth daily.   [DISCONTINUED] lisinopril  (ZESTRIL ) 20 MG tablet Take 1 tablet (20 mg total) by mouth daily.     Allergies:   Amoxicillin  and Sulfa antibiotics   Social History   Socioeconomic History   Marital status: Married    Spouse name: Not on file   Number of children: 3   Years of education: Not on file   Highest education level: Not on file  Occupational History   Occupation: Owns  Hotel manager  Tobacco Use   Smoking status: Never   Smokeless tobacco: Never  Substance and Sexual Activity   Alcohol use: No   Drug use: No   Sexual activity: Yes    Partners: Male  Other Topics Concern   Not on file  Social History Narrative   Gets reg exercise   Social Drivers of Health   Financial Resource Strain: Not on file  Food Insecurity: Not on file  Transportation Needs: Not on file  Physical Activity: Not on file  Stress: Not on file  Social Connections: Unknown (07/06/2021)   [facility]   Social Network    Social Network: Not on file     Family History: The patient's family history includes Alzheimer's disease in her maternal grandfather; Breast cancer in her mother; Hyperlipidemia in an other family member; Hypertension in an other family member; Kidney disease in an other family member; Lung cancer in her paternal grandmother; Stroke in her father. There is no history of Colon cancer, Rectal cancer, Esophageal cancer, or Liver cancer.  ROS:   Review of Systems  Constitution: Negative for decreased appetite, fever and weight gain.  HENT: Negative for congestion, ear discharge, hoarse voice and sore throat.   Eyes: Negative for discharge, redness, vision loss in right eye and visual halos.  Cardiovascular: Reported leg swelling and shortness of breath.  Negative for chest pain, orthopnea and palpitations.  Respiratory: Negative for cough, hemoptysis, shortness of breath and snoring.   Endocrine: Negative for heat intolerance and polyphagia.  Hematologic/Lymphatic: Negative for bleeding problem. Does not bruise/bleed easily.  Skin: Negative for flushing, nail changes, rash and suspicious lesions.  Musculoskeletal: Negative for arthritis, joint pain, muscle cramps, myalgias, neck pain and stiffness.  Gastrointestinal: Negative for abdominal pain, bowel incontinence, diarrhea and excessive appetite.  Genitourinary: Negative for  decreased libido, genital sores and incomplete emptying.  Neurological: Negative for brief paralysis, focal weakness, headaches and loss of balance.  Psychiatric/Behavioral: Negative for altered mental status, depression and suicidal ideas.  Allergic/Immunologic: Negative for HIV exposure and persistent infections.    EKGs/Labs/Other Studies Reviewed:    The following studies were reviewed today:   EKG:  The ekg ordered today demonstrates sinus rhythm, heart rate 61 bpm compared to prior EKG no significant change.   Transthoracic echocardiogram 2/29/2021 IMPRESSIONS   1. Left ventricular ejection fraction, by estimation, is 55 to 60%. The  left ventricle has normal function. The left ventricle has no regional  wall motion abnormalities. Left ventricular diastolic parameters are  indeterminate. The average left  ventricular global longitudinal strain is -17.8 %. The global longitudinal  strain is normal.   2. Right ventricular  systolic function is normal. The right ventricular  size is normal. There is normal pulmonary artery systolic pressure.   3. Left atrial size was mildly dilated.   4. The mitral valve is normal in structure. Trivial mitral valve  regurgitation. No evidence of mitral stenosis.   5. The aortic valve is tricuspid. Aortic valve regurgitation is not  visualized. No aortic stenosis is present.   6. The inferior vena cava is normal in size with greater than 50%  respiratory variability, suggesting right atrial pressure of 3 mmHg.  FINDINGS   Left Ventricle: Left ventricular ejection fraction, by estimation, is 55  to 60%. The left ventricle has normal function. The left ventricle has no  regional wall motion abnormalities. The average left ventricular global  longitudinal strain is -17.8 %.  The global longitudinal strain is normal. Global longitudinal strain  performed but not reported based on interpreter judgement due to  suboptimal tracking. The left  ventricular internal cavity size was normal  in size. There is no left ventricular  hypertrophy. Left ventricular diastolic parameters are indeterminate.   Right Ventricle: The right ventricular size is normal. No increase in  right ventricular wall thickness. Right ventricular systolic function is  normal. There is normal pulmonary artery systolic pressure. The tricuspid  regurgitant velocity is 1.36 m/s, and   with an assumed right atrial pressure of 3 mmHg, the estimated right  ventricular systolic pressure is 10.4 mmHg.   Left Atrium: Left atrial size was mildly dilated.   Right Atrium: Right atrial size was normal in size.   Pericardium: There is no evidence of pericardial effusion.   Mitral Valve: The mitral valve is normal in structure. Normal mobility of  the mitral valve leaflets. Trivial mitral valve regurgitation. No evidence  of mitral valve stenosis.   Tricuspid Valve: The tricuspid valve is normal in structure. Tricuspid  valve regurgitation is trivial. No evidence of tricuspid stenosis.   Aortic Valve: The aortic valve is tricuspid. Aortic valve regurgitation is  not visualized. No aortic stenosis is present.   Pulmonic Valve: The pulmonic valve was normal in structure. Pulmonic valve  regurgitation is not visualized. No evidence of pulmonic stenosis.   Aorta: The aortic root is normal in size and structure.   Venous: The inferior vena cava is normal in size with greater than 50%  respiratory variability, suggesting right atrial pressure of 3 mmHg.   IAS/Shunts: No atrial level shunt detected by color flow Doppler.   The patient wore the monitor for 30 days 5 hours starting September 28, 2019. Indication: Palpitations.   The minimum heart rate was 49 bpm, maximum heart rate was 169 bpm, and average heart rate was 80 bpm. Predominant underlying rhythm was Sinus Rhythm.   4 Supraventricular Tachycardia runs occurred, the run with the fastest interval lasting 8  beats with a maximum rate of 169 bpm, the longest lasting 13 beats with an average rate of 130 bpm.     Premature atrial complexes were rare (<1.0%). Premature Ventricular complexes were rare (<1.0%).   No ventricular tachycardia, no pauses, No AV block and no atrial fibrillation present. 16 triggered events 3 associated with premature atrial complex, no remaining associated with sinus tachycardia and sinus rhythm.  5 diary events are associated with sinus rhythm.   Conclusion: This study is remarkable for paroxysmal ventricular tachycardia, which is likely atrial tachycardia with AV block.       Recent Labs: 03/18/2023: ALT 14; BUN 13; Creatinine, Ser 0.80; Hemoglobin 16.2;  Platelets 255.0; Potassium 4.3; Sodium 140; TSH 0.43  Recent Lipid Panel    Component Value Date/Time   CHOL 162 03/18/2023 0959   TRIG 52.0 03/18/2023 0959   HDL 62.80 03/18/2023 0959   CHOLHDL 3 03/18/2023 0959   VLDL 10.4 03/18/2023 0959   LDLCALC 89 03/18/2023 0959    Physical Exam:    VS:  BP 134/88 (BP Location: Right Arm, Patient Position: Sitting, Cuff Size: Normal)   Pulse 69   Ht 5\' 8"  (1.727 m)   Wt 180 lb (81.6 kg)   SpO2 99%   BMI 27.37 kg/m     Wt Readings from Last 3 Encounters:  06/29/23 180 lb (81.6 kg)  03/15/23 185 lb 9.6 oz (84.2 kg)  08/20/22 168 lb (76.2 kg)     GEN: Well nourished, well developed in no acute distress HEENT: Normal NECK: No JVD; No carotid bruits LYMPHATICS: No lymphadenopathy CARDIAC: S1S2 noted,RRR, no murmurs, rubs, gallops RESPIRATORY:  Clear to auscultation without rales, wheezing or rhonchi  ABDOMEN: Soft, non-tender, non-distended, +bowel sounds, no guarding. EXTREMITIES: No edema, No cyanosis, no clubbing MUSCULOSKELETAL:  No deformity  SKIN: Warm and dry NEUROLOGIC:  Alert and oriented x 3, non-focal PSYCHIATRIC:  Normal affect, good insight  ASSESSMENT:    1. Primary Hypertension   2. Mixed hyperlipidemia    PLAN:    She is still  hypertensive - will stop the lisinopril  and I will start benicar  to better control her blood pressure.   Hyperlipidemia - continue with current statin medication.  The patient is in agreement with the above plan. The patient left the office in stable condition.  The patient will follow up in 1year.  Medication Adjustments/Labs and Tests Ordered: Current medicines are reviewed at length with the patient today.  Concerns regarding medicines are outlined above.  Orders Placed This Encounter  Procedures   EKG 12-Lead   Meds ordered this encounter  Medications   olmesartan  (BENICAR ) 20 MG tablet    Sig: Take 1 tablet (20 mg total) by mouth daily.    Dispense:  90 tablet    Refill:  3    Patient Instructions  Medication Instructions:  Your physician has recommended you make the following change in your medication:  STOP: Lisinopril  START: Olmesartan  20 mg once daily  *If you need a refill on your cardiac medications before your next appointment, please call your pharmacy*  Follow-Up: At [facility], you and your health needs are our priority.  As part of our continuing mission to provide you with exceptional heart care, our providers are all part of one team.  This team includes your primary Cardiologist (physician) and Advanced Practice Providers or APPs (Physician Assistants and Nurse Practitioners) who all work together to provide you with the care you need, when you need it.  Your next appointment:   1 year(s)  Provider:   Pamalee Marcoe, DO      Adopting a Healthy Lifestyle.  Know what a healthy weight is for you (roughly BMI <25) and aim to maintain this   Aim for 7+ servings of fruits and vegetables daily   65-80+ fluid ounces of water or unsweet tea for healthy kidneys   Limit to max 1 drink of alcohol per day; avoid smoking/tobacco   Limit animal fats in diet for cholesterol and heart health - choose grass fed whenever available   Avoid highly processed  foods, and foods high in saturated/trans fats   Aim for low stress - take  time to unwind and care for your mental health   Aim for 150 min of moderate intensity exercise weekly for heart health, and weights twice weekly for bone health   Aim for 7-9 hours of sleep daily   When it comes to diets, agreement about the perfect plan isnt easy to find, even among the experts. Experts at the [facility] [employer] developed an idea known as the Healthy Eating Plate. Just imagine a plate divided into logical, healthy portions.   The emphasis is on diet quality:   Load up on vegetables and fruits - one-half of your plate: Aim for color and variety, and remember that potatoes dont count.   Go for whole grains - one-quarter of your plate: Whole wheat, barley, wheat berries, quinoa, oats, brown rice, and foods made with them. If you want pasta, go with whole wheat pasta.   Protein power - one-quarter of your plate: Fish, chicken, beans, and nuts are all healthy, versatile protein sources. Limit red meat.   The diet, however, does go beyond the plate, offering a few other suggestions.   Use healthy plant oils, such as olive, canola, soy, corn, sunflower and peanut. Check the labels, and avoid partially hydrogenated oil, which have unhealthy trans fats.   If youre thirsty, drink water. Coffee and tea are good in moderation, but skip sugary drinks and limit milk and dairy products to one or two daily servings.   The type of carbohydrate in the diet is more important than the amount. Some sources of carbohydrates, such as vegetables, fruits, whole grains, and beans-are healthier than others.   Finally, stay active  Signed, Ruthann Angulo, DO  07/02/2023 7:57 PM    [facility]

## 2023-07-20 ENCOUNTER — Encounter: Payer: Self-pay | Admitting: Cardiology

## 2023-07-20 ENCOUNTER — Other Ambulatory Visit: Payer: Self-pay | Admitting: Cardiology

## 2023-07-21 NOTE — Telephone Encounter (Signed)
 Please review and advise.

## 2023-07-22 ENCOUNTER — Other Ambulatory Visit: Payer: Self-pay | Admitting: Family Medicine

## 2023-07-22 ENCOUNTER — Encounter: Payer: Self-pay | Admitting: Family Medicine

## 2023-07-22 DIAGNOSIS — F419 Anxiety disorder, unspecified: Secondary | ICD-10-CM

## 2023-07-22 NOTE — Telephone Encounter (Signed)
 Requesting: alprazolam  0.25mg   Contract: None UDS: None Last Visit: 03/15/23 Next Visit: None Last Refill: 08/20/22 #60 and 1RF   Please Advise

## 2023-08-18 ENCOUNTER — Encounter: Payer: Self-pay | Admitting: *Deleted

## 2023-08-18 DIAGNOSIS — N920 Excessive and frequent menstruation with regular cycle: Secondary | ICD-10-CM | POA: Insufficient documentation

## 2023-08-18 DIAGNOSIS — R399 Unspecified symptoms and signs involving the genitourinary system: Secondary | ICD-10-CM | POA: Insufficient documentation

## 2023-08-18 DIAGNOSIS — D649 Anemia, unspecified: Secondary | ICD-10-CM | POA: Insufficient documentation

## 2023-08-18 DIAGNOSIS — R300 Dysuria: Secondary | ICD-10-CM | POA: Insufficient documentation

## 2023-12-06 ENCOUNTER — Other Ambulatory Visit: Payer: Self-pay | Admitting: Family Medicine

## 2023-12-06 ENCOUNTER — Other Ambulatory Visit: Payer: Self-pay | Admitting: Cardiology

## 2023-12-06 DIAGNOSIS — F419 Anxiety disorder, unspecified: Secondary | ICD-10-CM

## 2023-12-06 NOTE — Telephone Encounter (Signed)
 Requesting: Xanax  Contract: n/a UDS: n/a Last OV: 03/15/2023 Next OV: n/a Last Refill: 07/22/2023, #60--0 RF Database:   Please advise

## 2024-01-14 DIAGNOSIS — M7672 Peroneal tendinitis, left leg: Secondary | ICD-10-CM | POA: Diagnosis not present

## 2024-02-15 ENCOUNTER — Telehealth: Payer: Self-pay | Admitting: Family Medicine

## 2024-02-15 NOTE — Telephone Encounter (Unsigned)
 Copied from CRM #[id number]. Topic: Clinical - Medication Refill >> Feb 15, 2024  3:18 PM Alfonso HERO wrote: Medication: albuterol  (VENTOLIN  HFA) 108 (90 Base) MCG/ACT inhaler  Has the patient contacted their pharmacy? Yes (Agent: If no, request that the patient contact the pharmacy for the refill. If patient does not wish to contact the pharmacy document the reason why and proceed with request.) (Agent: If yes, when and what did the pharmacy advise?)  This is the patient's preferred pharmacy:  [facility] [place], [place] - [address] [place] [address] Phone: [phone] Fax: [phone]  Is this the correct pharmacy for this prescription? Yes If no, delete pharmacy and type the correct one.   Has the prescription been filled recently? Yes  Is the patient out of the medication? Yes  Has the patient been seen for an appointment in the last year OR does the patient have an upcoming appointment? Yes  Can we respond through MyChart? Yes  Agent: Please be advised that Rx refills may take up to 3 business days. We ask that you follow-up with your pharmacy.

## 2024-02-16 ENCOUNTER — Other Ambulatory Visit: Payer: Self-pay | Admitting: Family

## 2024-02-16 MED ORDER — ALBUTEROL SULFATE HFA 108 (90 BASE) MCG/ACT IN AERS: 2.0000 | INHALATION_SPRAY | Freq: Four times a day (QID) | RESPIRATORY_TRACT | 2 refills | Status: AC | PRN

## 2024-02-23 ENCOUNTER — Other Ambulatory Visit: Payer: Self-pay | Admitting: Family Medicine

## 2024-02-23 ENCOUNTER — Other Ambulatory Visit: Payer: Self-pay | Admitting: Cardiology

## 2024-02-23 DIAGNOSIS — F419 Anxiety disorder, unspecified: Secondary | ICD-10-CM

## 2024-03-10 ENCOUNTER — Other Ambulatory Visit: Payer: Self-pay | Admitting: Cardiology

## 2024-03-10 MED ORDER — ROSUVASTATIN CALCIUM 10 MG PO TABS: 10.0000 mg | ORAL_TABLET | Freq: Every day | ORAL | 1 refills | Status: AC

## 2024-03-10 NOTE — Telephone Encounter (Signed)
 Lipid panel 03/18/23
